# Patient Record
Sex: Female | Born: 1955 | Race: Black or African American | Hispanic: No | Marital: Single | Smoking: Former smoker
Health system: Southern US, Community
[De-identification: ages and names within clinical notes are randomized; demographics above are authoritative.]

## PROBLEM LIST (undated history)

## (undated) DIAGNOSIS — R569 Unspecified convulsions: Secondary | ICD-10-CM

## (undated) DIAGNOSIS — Z87828 Personal history of other (healed) physical injury and trauma: Secondary | ICD-10-CM

## (undated) DIAGNOSIS — F0390 Unspecified dementia without behavioral disturbance: Secondary | ICD-10-CM

## (undated) DIAGNOSIS — F32A Depression, unspecified: Secondary | ICD-10-CM

## (undated) DIAGNOSIS — Z21 Asymptomatic human immunodeficiency virus [HIV] infection status: Secondary | ICD-10-CM

## (undated) DIAGNOSIS — B20 Human immunodeficiency virus [HIV] disease: Secondary | ICD-10-CM

## (undated) DIAGNOSIS — F329 Major depressive disorder, single episode, unspecified: Secondary | ICD-10-CM

## (undated) DIAGNOSIS — F1910 Other psychoactive substance abuse, uncomplicated: Secondary | ICD-10-CM

## (undated) HISTORY — DX: Personal history of other (healed) physical injury and trauma: Z87.828

## (undated) HISTORY — PX: OTHER SURGICAL HISTORY: SHX169

## (undated) HISTORY — PX: BREAST BIOPSY: SHX20

## (undated) HISTORY — DX: Human immunodeficiency virus (HIV) disease: B20

## (undated) HISTORY — DX: Asymptomatic human immunodeficiency virus (hiv) infection status: Z21

## (undated) HISTORY — DX: Other psychoactive substance abuse, uncomplicated: F19.10

---

## 1997-04-29 ENCOUNTER — Encounter (INDEPENDENT_AMBULATORY_CARE_PROVIDER_SITE_OTHER): Payer: Self-pay | Admitting: *Deleted

## 1997-04-29 LAB — CONVERTED CEMR LAB
CD4 Count: 340 microliters
CD4 T Cell Abs: 340

## 1997-08-12 ENCOUNTER — Encounter: Admission: RE | Admit: 1997-08-12 | Discharge: 1997-08-12 | Payer: Self-pay | Admitting: Family Medicine

## 1997-08-16 ENCOUNTER — Encounter: Admission: RE | Admit: 1997-08-16 | Discharge: 1997-08-16 | Payer: Self-pay | Admitting: Family Medicine

## 1997-08-20 ENCOUNTER — Encounter: Admission: RE | Admit: 1997-08-20 | Discharge: 1997-08-20 | Payer: Self-pay | Admitting: Family Medicine

## 1997-08-28 ENCOUNTER — Encounter: Admission: RE | Admit: 1997-08-28 | Discharge: 1997-08-28 | Payer: Self-pay | Admitting: Family Medicine

## 1997-09-02 ENCOUNTER — Encounter: Admission: RE | Admit: 1997-09-02 | Discharge: 1997-09-02 | Payer: Self-pay | Admitting: Family Medicine

## 1997-09-02 ENCOUNTER — Other Ambulatory Visit: Admission: RE | Admit: 1997-09-02 | Discharge: 1997-09-02 | Payer: Self-pay

## 1997-09-05 ENCOUNTER — Encounter: Admission: RE | Admit: 1997-09-05 | Discharge: 1997-09-05 | Payer: Self-pay | Admitting: Sports Medicine

## 1997-09-16 ENCOUNTER — Encounter: Admission: RE | Admit: 1997-09-16 | Discharge: 1997-09-16 | Payer: Self-pay | Admitting: Family Medicine

## 1997-09-26 ENCOUNTER — Encounter: Admission: RE | Admit: 1997-09-26 | Discharge: 1997-09-26 | Payer: Self-pay | Admitting: Family Medicine

## 1997-09-27 ENCOUNTER — Encounter: Admission: RE | Admit: 1997-09-27 | Discharge: 1997-09-27 | Payer: Self-pay | Admitting: Infectious Diseases

## 1997-10-07 ENCOUNTER — Emergency Department (HOSPITAL_COMMUNITY): Admission: EM | Admit: 1997-10-07 | Discharge: 1997-10-07 | Payer: Self-pay | Admitting: Emergency Medicine

## 1997-10-21 ENCOUNTER — Encounter: Admission: RE | Admit: 1997-10-21 | Discharge: 1997-10-21 | Payer: Self-pay | Admitting: Infectious Diseases

## 1997-11-14 ENCOUNTER — Encounter: Admission: RE | Admit: 1997-11-14 | Discharge: 1997-11-14 | Payer: Self-pay | Admitting: Family Medicine

## 1997-11-14 ENCOUNTER — Encounter: Admission: RE | Admit: 1997-11-14 | Discharge: 1997-11-14 | Payer: Self-pay | Admitting: Infectious Diseases

## 1997-11-21 ENCOUNTER — Encounter: Admission: RE | Admit: 1997-11-21 | Discharge: 1997-11-21 | Payer: Self-pay | Admitting: Family Medicine

## 1997-11-22 ENCOUNTER — Encounter: Admission: RE | Admit: 1997-11-22 | Discharge: 1997-11-22 | Payer: Self-pay | Admitting: Family Medicine

## 1997-12-13 ENCOUNTER — Encounter: Admission: RE | Admit: 1997-12-13 | Discharge: 1997-12-13 | Payer: Self-pay | Admitting: Family Medicine

## 1997-12-25 ENCOUNTER — Encounter: Admission: RE | Admit: 1997-12-25 | Discharge: 1997-12-25 | Payer: Self-pay | Admitting: Family Medicine

## 1997-12-31 ENCOUNTER — Encounter: Admission: RE | Admit: 1997-12-31 | Discharge: 1997-12-31 | Payer: Self-pay | Admitting: Sports Medicine

## 1998-01-02 ENCOUNTER — Encounter: Admission: RE | Admit: 1998-01-02 | Discharge: 1998-01-02 | Payer: Self-pay | Admitting: Family Medicine

## 1998-01-29 ENCOUNTER — Encounter: Admission: RE | Admit: 1998-01-29 | Discharge: 1998-01-29 | Payer: Self-pay | Admitting: Family Medicine

## 1998-02-14 ENCOUNTER — Encounter: Admission: RE | Admit: 1998-02-14 | Discharge: 1998-02-14 | Payer: Self-pay | Admitting: Family Medicine

## 1998-02-27 ENCOUNTER — Encounter: Admission: RE | Admit: 1998-02-27 | Discharge: 1998-02-27 | Payer: Self-pay | Admitting: Family Medicine

## 1998-03-25 ENCOUNTER — Encounter: Admission: RE | Admit: 1998-03-25 | Discharge: 1998-03-25 | Payer: Self-pay | Admitting: Family Medicine

## 1998-03-28 ENCOUNTER — Encounter: Admission: RE | Admit: 1998-03-28 | Discharge: 1998-03-28 | Payer: Self-pay | Admitting: Family Medicine

## 1998-04-18 ENCOUNTER — Encounter: Admission: RE | Admit: 1998-04-18 | Discharge: 1998-04-18 | Payer: Self-pay | Admitting: Family Medicine

## 1998-05-20 ENCOUNTER — Encounter: Admission: RE | Admit: 1998-05-20 | Discharge: 1998-05-20 | Payer: Self-pay | Admitting: Family Medicine

## 1998-06-05 ENCOUNTER — Encounter: Admission: RE | Admit: 1998-06-05 | Discharge: 1998-06-05 | Payer: Self-pay | Admitting: Family Medicine

## 1998-07-10 ENCOUNTER — Encounter: Admission: RE | Admit: 1998-07-10 | Discharge: 1998-07-10 | Payer: Self-pay | Admitting: Internal Medicine

## 1998-07-10 ENCOUNTER — Ambulatory Visit (HOSPITAL_COMMUNITY): Admission: RE | Admit: 1998-07-10 | Discharge: 1998-07-10 | Payer: Self-pay | Admitting: Infectious Diseases

## 1998-07-16 ENCOUNTER — Encounter: Admission: RE | Admit: 1998-07-16 | Discharge: 1998-07-16 | Payer: Self-pay | Admitting: Family Medicine

## 1998-08-14 ENCOUNTER — Encounter: Admission: RE | Admit: 1998-08-14 | Discharge: 1998-08-14 | Payer: Self-pay | Admitting: Family Medicine

## 1998-08-15 ENCOUNTER — Encounter: Admission: RE | Admit: 1998-08-15 | Discharge: 1998-08-15 | Payer: Self-pay | Admitting: Infectious Diseases

## 1998-08-25 ENCOUNTER — Encounter: Admission: RE | Admit: 1998-08-25 | Discharge: 1998-08-25 | Payer: Self-pay | Admitting: Sports Medicine

## 1998-09-01 ENCOUNTER — Encounter: Admission: RE | Admit: 1998-09-01 | Discharge: 1998-09-01 | Payer: Self-pay | Admitting: Family Medicine

## 1998-09-11 ENCOUNTER — Encounter: Admission: RE | Admit: 1998-09-11 | Discharge: 1998-09-11 | Payer: Self-pay | Admitting: Family Medicine

## 1998-10-14 ENCOUNTER — Emergency Department (HOSPITAL_COMMUNITY): Admission: EM | Admit: 1998-10-14 | Discharge: 1998-10-14 | Payer: Self-pay | Admitting: Emergency Medicine

## 1998-10-14 ENCOUNTER — Encounter: Payer: Self-pay | Admitting: Emergency Medicine

## 1998-10-21 ENCOUNTER — Encounter: Admission: RE | Admit: 1998-10-21 | Discharge: 1998-10-21 | Payer: Self-pay | Admitting: Sports Medicine

## 1998-11-13 ENCOUNTER — Encounter: Admission: RE | Admit: 1998-11-13 | Discharge: 1998-11-13 | Payer: Self-pay | Admitting: Family Medicine

## 1998-11-13 ENCOUNTER — Ambulatory Visit (HOSPITAL_COMMUNITY): Admission: RE | Admit: 1998-11-13 | Discharge: 1998-11-13 | Payer: Self-pay | Admitting: Infectious Diseases

## 1998-12-09 ENCOUNTER — Encounter: Admission: RE | Admit: 1998-12-09 | Discharge: 1998-12-09 | Payer: Self-pay | Admitting: Family Medicine

## 1998-12-24 ENCOUNTER — Encounter: Admission: RE | Admit: 1998-12-24 | Discharge: 1998-12-24 | Payer: Self-pay | Admitting: Family Medicine

## 1998-12-30 ENCOUNTER — Encounter: Admission: RE | Admit: 1998-12-30 | Discharge: 1998-12-30 | Payer: Self-pay | Admitting: Family Medicine

## 1999-01-01 ENCOUNTER — Encounter: Admission: RE | Admit: 1999-01-01 | Discharge: 1999-01-01 | Payer: Self-pay | Admitting: Family Medicine

## 1999-01-21 ENCOUNTER — Encounter: Admission: RE | Admit: 1999-01-21 | Discharge: 1999-01-21 | Payer: Self-pay | Admitting: Family Medicine

## 1999-02-12 ENCOUNTER — Ambulatory Visit (HOSPITAL_COMMUNITY): Admission: RE | Admit: 1999-02-12 | Discharge: 1999-02-12 | Payer: Self-pay | Admitting: Infectious Diseases

## 1999-02-12 ENCOUNTER — Encounter: Admission: RE | Admit: 1999-02-12 | Discharge: 1999-02-12 | Payer: Self-pay | Admitting: Infectious Diseases

## 1999-03-30 ENCOUNTER — Encounter: Admission: RE | Admit: 1999-03-30 | Discharge: 1999-03-30 | Payer: Self-pay | Admitting: Family Medicine

## 1999-03-31 ENCOUNTER — Encounter: Admission: RE | Admit: 1999-03-31 | Discharge: 1999-03-31 | Payer: Self-pay | Admitting: Sports Medicine

## 1999-04-20 ENCOUNTER — Encounter: Admission: RE | Admit: 1999-04-20 | Discharge: 1999-04-20 | Payer: Self-pay | Admitting: Family Medicine

## 1999-04-29 ENCOUNTER — Encounter: Admission: RE | Admit: 1999-04-29 | Discharge: 1999-04-29 | Payer: Self-pay | Admitting: Family Medicine

## 1999-05-18 ENCOUNTER — Encounter: Admission: RE | Admit: 1999-05-18 | Discharge: 1999-05-18 | Payer: Self-pay | Admitting: Family Medicine

## 1999-06-03 ENCOUNTER — Encounter: Admission: RE | Admit: 1999-06-03 | Discharge: 1999-06-03 | Payer: Self-pay | Admitting: Family Medicine

## 1999-06-03 ENCOUNTER — Other Ambulatory Visit: Admission: RE | Admit: 1999-06-03 | Discharge: 1999-06-03 | Payer: Self-pay

## 1999-06-08 ENCOUNTER — Encounter: Admission: RE | Admit: 1999-06-08 | Discharge: 1999-06-08 | Payer: Self-pay | Admitting: Family Medicine

## 1999-06-25 ENCOUNTER — Encounter: Admission: RE | Admit: 1999-06-25 | Discharge: 1999-06-25 | Payer: Self-pay | Admitting: Family Medicine

## 1999-07-23 ENCOUNTER — Ambulatory Visit (HOSPITAL_COMMUNITY): Admission: RE | Admit: 1999-07-23 | Discharge: 1999-07-23 | Payer: Self-pay | Admitting: Infectious Diseases

## 1999-07-23 ENCOUNTER — Encounter: Admission: RE | Admit: 1999-07-23 | Discharge: 1999-07-23 | Payer: Self-pay | Admitting: Infectious Diseases

## 1999-08-10 ENCOUNTER — Encounter: Admission: RE | Admit: 1999-08-10 | Discharge: 1999-08-10 | Payer: Self-pay | Admitting: Sports Medicine

## 1999-08-10 ENCOUNTER — Encounter: Payer: Self-pay | Admitting: Sports Medicine

## 1999-08-31 ENCOUNTER — Encounter: Admission: RE | Admit: 1999-08-31 | Discharge: 1999-08-31 | Payer: Self-pay | Admitting: Family Medicine

## 1999-09-17 ENCOUNTER — Encounter: Admission: RE | Admit: 1999-09-17 | Discharge: 1999-09-17 | Payer: Self-pay | Admitting: Obstetrics

## 1999-11-05 ENCOUNTER — Encounter: Admission: RE | Admit: 1999-11-05 | Discharge: 1999-11-05 | Payer: Self-pay | Admitting: Family Medicine

## 2000-01-28 ENCOUNTER — Encounter: Admission: RE | Admit: 2000-01-28 | Discharge: 2000-01-28 | Payer: Self-pay | Admitting: Family Medicine

## 2000-02-22 ENCOUNTER — Encounter: Admission: RE | Admit: 2000-02-22 | Discharge: 2000-02-22 | Payer: Self-pay | Admitting: Family Medicine

## 2000-03-08 ENCOUNTER — Encounter: Admission: RE | Admit: 2000-03-08 | Discharge: 2000-03-08 | Payer: Self-pay | Admitting: Family Medicine

## 2000-03-15 ENCOUNTER — Encounter: Admission: RE | Admit: 2000-03-15 | Discharge: 2000-03-15 | Payer: Self-pay | Admitting: Infectious Diseases

## 2000-03-15 ENCOUNTER — Ambulatory Visit (HOSPITAL_COMMUNITY): Admission: RE | Admit: 2000-03-15 | Discharge: 2000-03-15 | Payer: Self-pay | Admitting: Infectious Diseases

## 2000-03-27 ENCOUNTER — Inpatient Hospital Stay (HOSPITAL_COMMUNITY): Admission: EM | Admit: 2000-03-27 | Discharge: 2000-03-30 | Payer: Self-pay | Admitting: *Deleted

## 2000-03-27 ENCOUNTER — Encounter: Payer: Self-pay | Admitting: *Deleted

## 2000-04-21 ENCOUNTER — Encounter: Admission: RE | Admit: 2000-04-21 | Discharge: 2000-04-21 | Payer: Self-pay | Admitting: Family Medicine

## 2000-06-07 ENCOUNTER — Encounter: Payer: Self-pay | Admitting: Specialist

## 2000-06-07 ENCOUNTER — Inpatient Hospital Stay (HOSPITAL_COMMUNITY): Admission: EM | Admit: 2000-06-07 | Discharge: 2000-06-13 | Payer: Self-pay | Admitting: Emergency Medicine

## 2000-06-09 ENCOUNTER — Encounter: Payer: Self-pay | Admitting: Specialist

## 2000-06-20 ENCOUNTER — Emergency Department (HOSPITAL_COMMUNITY): Admission: EM | Admit: 2000-06-20 | Discharge: 2000-06-20 | Payer: Self-pay | Admitting: Emergency Medicine

## 2000-07-04 ENCOUNTER — Encounter (HOSPITAL_COMMUNITY): Admission: RE | Admit: 2000-07-04 | Discharge: 2000-10-02 | Payer: Self-pay | Admitting: *Deleted

## 2000-07-14 ENCOUNTER — Encounter: Admission: RE | Admit: 2000-07-14 | Discharge: 2000-07-14 | Payer: Self-pay | Admitting: Family Medicine

## 2000-08-04 ENCOUNTER — Encounter: Admission: RE | Admit: 2000-08-04 | Discharge: 2000-08-04 | Payer: Self-pay | Admitting: Internal Medicine

## 2000-08-25 ENCOUNTER — Ambulatory Visit (HOSPITAL_COMMUNITY): Admission: RE | Admit: 2000-08-25 | Discharge: 2000-08-25 | Payer: Self-pay | Admitting: Infectious Diseases

## 2000-08-25 ENCOUNTER — Encounter: Admission: RE | Admit: 2000-08-25 | Discharge: 2000-08-25 | Payer: Self-pay | Admitting: Internal Medicine

## 2000-10-10 ENCOUNTER — Encounter: Admission: RE | Admit: 2000-10-10 | Discharge: 2000-10-10 | Payer: Self-pay | Admitting: Family Medicine

## 2000-12-01 ENCOUNTER — Ambulatory Visit (HOSPITAL_COMMUNITY): Admission: RE | Admit: 2000-12-01 | Discharge: 2000-12-01 | Payer: Self-pay | Admitting: Infectious Diseases

## 2000-12-01 ENCOUNTER — Encounter: Admission: RE | Admit: 2000-12-01 | Discharge: 2000-12-01 | Payer: Self-pay | Admitting: Infectious Diseases

## 2000-12-15 ENCOUNTER — Encounter: Admission: RE | Admit: 2000-12-15 | Discharge: 2000-12-15 | Payer: Self-pay | Admitting: Infectious Diseases

## 2000-12-22 ENCOUNTER — Encounter: Admission: RE | Admit: 2000-12-22 | Discharge: 2000-12-22 | Payer: Self-pay | Admitting: Infectious Diseases

## 2001-01-06 ENCOUNTER — Encounter: Admission: RE | Admit: 2001-01-06 | Discharge: 2001-01-06 | Payer: Self-pay | Admitting: Sports Medicine

## 2001-02-22 ENCOUNTER — Encounter (INDEPENDENT_AMBULATORY_CARE_PROVIDER_SITE_OTHER): Payer: Self-pay | Admitting: *Deleted

## 2001-02-22 ENCOUNTER — Other Ambulatory Visit: Admission: RE | Admit: 2001-02-22 | Discharge: 2001-02-22 | Payer: Self-pay | Admitting: *Deleted

## 2001-02-22 ENCOUNTER — Encounter: Admission: RE | Admit: 2001-02-22 | Discharge: 2001-02-22 | Payer: Self-pay | Admitting: Family Medicine

## 2001-03-15 ENCOUNTER — Encounter: Admission: RE | Admit: 2001-03-15 | Discharge: 2001-03-15 | Payer: Self-pay | Admitting: Family Medicine

## 2001-03-31 ENCOUNTER — Encounter: Admission: RE | Admit: 2001-03-31 | Discharge: 2001-03-31 | Payer: Self-pay | Admitting: Family Medicine

## 2001-05-25 ENCOUNTER — Encounter: Admission: RE | Admit: 2001-05-25 | Discharge: 2001-05-25 | Payer: Self-pay | Admitting: Family Medicine

## 2001-06-16 ENCOUNTER — Encounter: Admission: RE | Admit: 2001-06-16 | Discharge: 2001-06-16 | Payer: Self-pay | Admitting: Family Medicine

## 2001-11-13 ENCOUNTER — Ambulatory Visit (HOSPITAL_COMMUNITY): Admission: RE | Admit: 2001-11-13 | Discharge: 2001-11-13 | Payer: Self-pay | Admitting: Infectious Diseases

## 2001-11-13 ENCOUNTER — Encounter: Admission: RE | Admit: 2001-11-13 | Discharge: 2001-11-13 | Payer: Self-pay | Admitting: Infectious Diseases

## 2001-12-14 ENCOUNTER — Encounter: Admission: RE | Admit: 2001-12-14 | Discharge: 2001-12-14 | Payer: Self-pay | Admitting: Infectious Diseases

## 2002-01-19 ENCOUNTER — Other Ambulatory Visit: Admission: RE | Admit: 2002-01-19 | Discharge: 2002-01-19 | Payer: Self-pay | Admitting: Family Medicine

## 2002-01-19 ENCOUNTER — Encounter: Admission: RE | Admit: 2002-01-19 | Discharge: 2002-01-19 | Payer: Self-pay | Admitting: Family Medicine

## 2002-01-24 ENCOUNTER — Encounter: Admission: RE | Admit: 2002-01-24 | Discharge: 2002-01-24 | Payer: Self-pay | Admitting: Family Medicine

## 2002-01-24 ENCOUNTER — Encounter: Payer: Self-pay | Admitting: Family Medicine

## 2002-02-19 ENCOUNTER — Encounter: Admission: RE | Admit: 2002-02-19 | Discharge: 2002-02-19 | Payer: Self-pay | Admitting: Family Medicine

## 2002-02-26 ENCOUNTER — Encounter: Admission: RE | Admit: 2002-02-26 | Discharge: 2002-02-26 | Payer: Self-pay | Admitting: Family Medicine

## 2002-02-27 ENCOUNTER — Encounter (INDEPENDENT_AMBULATORY_CARE_PROVIDER_SITE_OTHER): Payer: Self-pay | Admitting: Specialist

## 2002-02-27 ENCOUNTER — Encounter: Payer: Self-pay | Admitting: Family Medicine

## 2002-02-27 ENCOUNTER — Encounter: Admission: RE | Admit: 2002-02-27 | Discharge: 2002-02-27 | Payer: Self-pay | Admitting: Family Medicine

## 2002-05-21 ENCOUNTER — Encounter: Admission: RE | Admit: 2002-05-21 | Discharge: 2002-05-21 | Payer: Self-pay | Admitting: Family Medicine

## 2002-08-16 ENCOUNTER — Encounter: Admission: RE | Admit: 2002-08-16 | Discharge: 2002-08-16 | Payer: Self-pay | Admitting: Infectious Diseases

## 2002-08-20 ENCOUNTER — Encounter: Admission: RE | Admit: 2002-08-20 | Discharge: 2002-08-20 | Payer: Self-pay | Admitting: Sports Medicine

## 2002-08-30 ENCOUNTER — Encounter: Admission: RE | Admit: 2002-08-30 | Discharge: 2002-08-30 | Payer: Self-pay | Admitting: Infectious Diseases

## 2002-11-07 ENCOUNTER — Encounter: Admission: RE | Admit: 2002-11-07 | Discharge: 2002-11-07 | Payer: Self-pay | Admitting: Family Medicine

## 2003-01-09 ENCOUNTER — Encounter (INDEPENDENT_AMBULATORY_CARE_PROVIDER_SITE_OTHER): Payer: Self-pay | Admitting: *Deleted

## 2003-01-09 LAB — CONVERTED CEMR LAB

## 2003-01-24 ENCOUNTER — Encounter: Admission: RE | Admit: 2003-01-24 | Discharge: 2003-01-24 | Payer: Self-pay | Admitting: Family Medicine

## 2003-02-04 ENCOUNTER — Other Ambulatory Visit: Admission: RE | Admit: 2003-02-04 | Discharge: 2003-02-04 | Payer: Self-pay | Admitting: Family Medicine

## 2003-02-04 ENCOUNTER — Encounter: Admission: RE | Admit: 2003-02-04 | Discharge: 2003-02-04 | Payer: Self-pay | Admitting: Family Medicine

## 2003-03-28 ENCOUNTER — Encounter: Admission: RE | Admit: 2003-03-28 | Discharge: 2003-03-28 | Payer: Self-pay | Admitting: Infectious Diseases

## 2003-04-23 ENCOUNTER — Encounter: Admission: RE | Admit: 2003-04-23 | Discharge: 2003-04-23 | Payer: Self-pay | Admitting: Sports Medicine

## 2003-05-17 ENCOUNTER — Encounter: Admission: RE | Admit: 2003-05-17 | Discharge: 2003-05-17 | Payer: Self-pay | Admitting: Infectious Diseases

## 2003-07-11 ENCOUNTER — Encounter: Admission: RE | Admit: 2003-07-11 | Discharge: 2003-07-11 | Payer: Self-pay | Admitting: Family Medicine

## 2003-08-22 ENCOUNTER — Emergency Department (HOSPITAL_COMMUNITY): Admission: EM | Admit: 2003-08-22 | Discharge: 2003-08-22 | Payer: Self-pay | Admitting: Emergency Medicine

## 2003-10-11 ENCOUNTER — Emergency Department (HOSPITAL_COMMUNITY): Admission: EM | Admit: 2003-10-11 | Discharge: 2003-10-11 | Payer: Self-pay | Admitting: Emergency Medicine

## 2003-10-24 ENCOUNTER — Emergency Department (HOSPITAL_COMMUNITY): Admission: EM | Admit: 2003-10-24 | Discharge: 2003-10-24 | Payer: Self-pay | Admitting: Emergency Medicine

## 2003-11-08 ENCOUNTER — Ambulatory Visit (HOSPITAL_COMMUNITY): Admission: RE | Admit: 2003-11-08 | Discharge: 2003-11-08 | Payer: Self-pay | Admitting: Infectious Diseases

## 2003-11-08 ENCOUNTER — Encounter: Admission: RE | Admit: 2003-11-08 | Discharge: 2003-11-08 | Payer: Self-pay | Admitting: Infectious Diseases

## 2004-02-03 ENCOUNTER — Ambulatory Visit: Payer: Self-pay | Admitting: Sports Medicine

## 2004-02-17 ENCOUNTER — Ambulatory Visit: Payer: Self-pay | Admitting: Sports Medicine

## 2004-04-10 ENCOUNTER — Ambulatory Visit: Payer: Self-pay | Admitting: Infectious Diseases

## 2004-06-18 ENCOUNTER — Ambulatory Visit (HOSPITAL_COMMUNITY): Admission: RE | Admit: 2004-06-18 | Discharge: 2004-06-18 | Payer: Self-pay | Admitting: Infectious Diseases

## 2004-06-18 ENCOUNTER — Ambulatory Visit: Payer: Self-pay | Admitting: Infectious Diseases

## 2004-11-02 ENCOUNTER — Ambulatory Visit: Payer: Self-pay | Admitting: Sports Medicine

## 2004-11-23 ENCOUNTER — Ambulatory Visit: Payer: Self-pay | Admitting: Sports Medicine

## 2005-01-07 ENCOUNTER — Ambulatory Visit: Payer: Self-pay | Admitting: Infectious Diseases

## 2005-01-19 ENCOUNTER — Emergency Department (HOSPITAL_COMMUNITY): Admission: EM | Admit: 2005-01-19 | Discharge: 2005-01-19 | Payer: Self-pay | Admitting: Emergency Medicine

## 2005-03-30 ENCOUNTER — Ambulatory Visit: Payer: Self-pay | Admitting: Obstetrics & Gynecology

## 2005-03-30 ENCOUNTER — Encounter (INDEPENDENT_AMBULATORY_CARE_PROVIDER_SITE_OTHER): Payer: Self-pay | Admitting: *Deleted

## 2005-03-30 ENCOUNTER — Encounter: Payer: Self-pay | Admitting: Obstetrics & Gynecology

## 2005-04-07 ENCOUNTER — Ambulatory Visit: Payer: Self-pay | Admitting: Infectious Diseases

## 2005-04-07 ENCOUNTER — Ambulatory Visit (HOSPITAL_COMMUNITY): Admission: RE | Admit: 2005-04-07 | Discharge: 2005-04-07 | Payer: Self-pay | Admitting: Infectious Diseases

## 2005-05-24 ENCOUNTER — Emergency Department (HOSPITAL_COMMUNITY): Admission: EM | Admit: 2005-05-24 | Discharge: 2005-05-25 | Payer: Self-pay | Admitting: Emergency Medicine

## 2005-06-11 ENCOUNTER — Ambulatory Visit: Payer: Self-pay | Admitting: Family Medicine

## 2005-07-15 ENCOUNTER — Ambulatory Visit: Payer: Self-pay | Admitting: Family Medicine

## 2005-07-29 ENCOUNTER — Ambulatory Visit: Payer: Self-pay | Admitting: Family Medicine

## 2005-08-18 ENCOUNTER — Encounter: Admission: RE | Admit: 2005-08-18 | Discharge: 2005-08-18 | Payer: Self-pay | Admitting: Infectious Diseases

## 2005-08-18 ENCOUNTER — Ambulatory Visit: Payer: Self-pay | Admitting: Infectious Diseases

## 2005-08-18 ENCOUNTER — Encounter (INDEPENDENT_AMBULATORY_CARE_PROVIDER_SITE_OTHER): Payer: Self-pay | Admitting: *Deleted

## 2005-08-18 LAB — CONVERTED CEMR LAB
CD4 Count: 750 microliters
HIV 1 RNA Quant: 399 copies/mL

## 2005-10-27 ENCOUNTER — Ambulatory Visit: Payer: Self-pay | Admitting: Family Medicine

## 2006-01-27 ENCOUNTER — Encounter: Admission: RE | Admit: 2006-01-27 | Discharge: 2006-01-27 | Payer: Self-pay | Admitting: Infectious Diseases

## 2006-01-27 ENCOUNTER — Encounter (INDEPENDENT_AMBULATORY_CARE_PROVIDER_SITE_OTHER): Payer: Self-pay | Admitting: *Deleted

## 2006-01-27 ENCOUNTER — Ambulatory Visit: Payer: Self-pay | Admitting: Infectious Diseases

## 2006-03-11 DIAGNOSIS — F1420 Cocaine dependence, uncomplicated: Secondary | ICD-10-CM | POA: Insufficient documentation

## 2006-03-11 DIAGNOSIS — F1020 Alcohol dependence, uncomplicated: Secondary | ICD-10-CM | POA: Insufficient documentation

## 2006-03-11 DIAGNOSIS — Z8619 Personal history of other infectious and parasitic diseases: Secondary | ICD-10-CM

## 2006-03-11 DIAGNOSIS — B20 Human immunodeficiency virus [HIV] disease: Secondary | ICD-10-CM | POA: Insufficient documentation

## 2006-03-11 DIAGNOSIS — F0390 Unspecified dementia without behavioral disturbance: Secondary | ICD-10-CM

## 2006-03-11 DIAGNOSIS — F329 Major depressive disorder, single episode, unspecified: Secondary | ICD-10-CM

## 2006-03-11 DIAGNOSIS — R569 Unspecified convulsions: Secondary | ICD-10-CM | POA: Insufficient documentation

## 2006-03-11 DIAGNOSIS — A5149 Other secondary syphilitic conditions: Secondary | ICD-10-CM | POA: Insufficient documentation

## 2006-06-09 ENCOUNTER — Encounter (INDEPENDENT_AMBULATORY_CARE_PROVIDER_SITE_OTHER): Payer: Self-pay | Admitting: *Deleted

## 2006-06-09 ENCOUNTER — Encounter: Admission: RE | Admit: 2006-06-09 | Discharge: 2006-06-09 | Payer: Self-pay | Admitting: Infectious Diseases

## 2006-06-09 ENCOUNTER — Ambulatory Visit: Payer: Self-pay | Admitting: Infectious Diseases

## 2006-06-09 LAB — CONVERTED CEMR LAB
BUN: 10 mg/dL (ref 6–23)
Basophils Relative: 1 % (ref 0–1)
CO2: 26 meq/L (ref 19–32)
Chloride: 109 meq/L (ref 96–112)
Eosinophils Absolute: 0.1 10*3/uL (ref 0.0–0.7)
HIV 1 RNA Quant: <50 copies/mL (ref <50)
HIV-1 RNA Quant, Log: <1.7 (ref <1.70)
Lymphocytes Relative: 39 % (ref 12–46)
Lymphs Abs: 2 10*3/uL (ref 0.7–3.3)
Monocytes Relative: 7 % (ref 3–11)
Neutro Abs: 2.6 10*3/uL (ref 1.7–7.7)
Neutrophils Relative %: 50 % (ref 43–77)
Platelets: 174 10*3/uL (ref 150–400)
RDW: 14.7 % — ABNORMAL HIGH (ref 11.5–14.0)
Total Bilirubin: 0.3 mg/dL (ref 0.3–1.2)
Total Protein: 8.6 g/dL — ABNORMAL HIGH (ref 6.0–8.3)

## 2006-07-04 ENCOUNTER — Encounter: Payer: Self-pay | Admitting: Infectious Diseases

## 2006-07-04 ENCOUNTER — Encounter (INDEPENDENT_AMBULATORY_CARE_PROVIDER_SITE_OTHER): Payer: Self-pay | Admitting: *Deleted

## 2006-07-04 LAB — CONVERTED CEMR LAB: Pap Smear: NORMAL

## 2006-07-08 ENCOUNTER — Encounter (INDEPENDENT_AMBULATORY_CARE_PROVIDER_SITE_OTHER): Payer: Self-pay | Admitting: *Deleted

## 2006-07-17 ENCOUNTER — Encounter (INDEPENDENT_AMBULATORY_CARE_PROVIDER_SITE_OTHER): Payer: Self-pay | Admitting: *Deleted

## 2006-09-08 ENCOUNTER — Telehealth: Payer: Self-pay | Admitting: *Deleted

## 2006-09-09 ENCOUNTER — Encounter: Admission: RE | Admit: 2006-09-09 | Discharge: 2006-09-09 | Payer: Self-pay | Admitting: Internal Medicine

## 2006-09-09 ENCOUNTER — Ambulatory Visit: Payer: Self-pay | Admitting: Internal Medicine

## 2006-09-09 LAB — CONVERTED CEMR LAB
ALT: 28 units/L (ref 0–35)
AST: 43 units/L — ABNORMAL HIGH (ref 0–37)
Albumin: 4.3 g/dL (ref 3.5–5.2)
Basophils Absolute: 0 10*3/uL (ref 0.0–0.1)
Basophils Relative: 1 % (ref 0–1)
Calcium: 8.8 mg/dL (ref 8.4–10.5)
Eosinophils Relative: 2 % (ref 0–5)
HCT: 40.6 % (ref 36.0–46.0)
MCHC: 31.5 g/dL (ref 30.0–36.0)
MCV: 89.2 fL (ref 78.0–100.0)
Monocytes Relative: 8 % (ref 3–11)
Neutrophils Relative %: 54 % (ref 43–77)
WBC: 4.6 10*3/uL (ref 4.0–10.5)

## 2006-09-16 ENCOUNTER — Telehealth: Payer: Self-pay | Admitting: Infectious Diseases

## 2006-09-21 ENCOUNTER — Ambulatory Visit: Payer: Self-pay | Admitting: Family Medicine

## 2006-09-21 ENCOUNTER — Encounter: Payer: Self-pay | Admitting: Family Medicine

## 2006-09-21 DIAGNOSIS — N912 Amenorrhea, unspecified: Secondary | ICD-10-CM

## 2006-10-13 ENCOUNTER — Ambulatory Visit: Payer: Self-pay | Admitting: Family Medicine

## 2007-01-30 ENCOUNTER — Ambulatory Visit: Payer: Self-pay | Admitting: Sports Medicine

## 2007-01-30 ENCOUNTER — Encounter (INDEPENDENT_AMBULATORY_CARE_PROVIDER_SITE_OTHER): Payer: Self-pay | Admitting: *Deleted

## 2007-01-30 LAB — CONVERTED CEMR LAB: Beta hcg, urine, semiquantitative: NEGATIVE

## 2007-03-03 ENCOUNTER — Telehealth: Payer: Self-pay | Admitting: Infectious Diseases

## 2007-03-27 ENCOUNTER — Telehealth: Payer: Self-pay | Admitting: Infectious Diseases

## 2007-04-25 ENCOUNTER — Encounter (INDEPENDENT_AMBULATORY_CARE_PROVIDER_SITE_OTHER): Payer: Self-pay | Admitting: *Deleted

## 2007-04-25 ENCOUNTER — Ambulatory Visit: Payer: Self-pay | Admitting: Family Medicine

## 2007-04-25 DIAGNOSIS — R259 Unspecified abnormal involuntary movements: Secondary | ICD-10-CM | POA: Insufficient documentation

## 2007-04-25 LAB — CONVERTED CEMR LAB
Amphetamine Screen, Ur: NEGATIVE
Barbiturate Quant, Ur: NEGATIVE
Benzodiazepines.: NEGATIVE
Cocaine Metabolites: NEGATIVE
Methadone: NEGATIVE
Phencyclidine (PCP): NEGATIVE

## 2007-05-09 ENCOUNTER — Encounter: Payer: Self-pay | Admitting: Infectious Diseases

## 2007-06-10 ENCOUNTER — Emergency Department (HOSPITAL_COMMUNITY): Admission: EM | Admit: 2007-06-10 | Discharge: 2007-06-10 | Payer: Self-pay | Admitting: Emergency Medicine

## 2007-06-22 ENCOUNTER — Encounter: Payer: Self-pay | Admitting: *Deleted

## 2007-08-07 ENCOUNTER — Encounter (INDEPENDENT_AMBULATORY_CARE_PROVIDER_SITE_OTHER): Payer: Self-pay | Admitting: *Deleted

## 2007-08-07 ENCOUNTER — Encounter: Payer: Self-pay | Admitting: Infectious Diseases

## 2007-08-10 ENCOUNTER — Encounter: Admission: RE | Admit: 2007-08-10 | Discharge: 2007-08-10 | Payer: Self-pay | Admitting: Infectious Diseases

## 2007-08-10 ENCOUNTER — Ambulatory Visit: Payer: Self-pay | Admitting: Infectious Diseases

## 2007-08-10 LAB — CONVERTED CEMR LAB
ALT: 33 units/L (ref 0–35)
Basophils Absolute: 0 10*3/uL (ref 0.0–0.1)
CO2: 22 meq/L (ref 19–32)
Calcium: 9.2 mg/dL (ref 8.4–10.5)
HDL: 38 mg/dL — ABNORMAL LOW (ref >39)
MCHC: 31.8 g/dL (ref 30.0–36.0)
MCV: 85.5 fL (ref 78.0–100.0)
Monocytes Relative: 9 % (ref 3–12)
Neutrophils Relative %: 52 % (ref 43–77)
Platelets: 154 10*3/uL (ref 150–400)
RBC: 4.68 M/uL (ref 3.87–5.11)
RPR Titer: 1:2 {titer}
Sodium: 141 meq/L (ref 135–145)
Total CHOL/HDL Ratio: 4.3
Triglycerides: 194 mg/dL — ABNORMAL HIGH (ref <150)
WBC: 4.4 10*3/uL (ref 4.0–10.5)

## 2007-08-24 ENCOUNTER — Ambulatory Visit: Payer: Self-pay | Admitting: Infectious Diseases

## 2007-09-04 ENCOUNTER — Telehealth (INDEPENDENT_AMBULATORY_CARE_PROVIDER_SITE_OTHER): Payer: Self-pay | Admitting: *Deleted

## 2007-09-26 ENCOUNTER — Encounter: Payer: Self-pay | Admitting: Infectious Diseases

## 2007-09-26 ENCOUNTER — Ambulatory Visit: Payer: Self-pay | Admitting: Infectious Diseases

## 2007-12-04 ENCOUNTER — Ambulatory Visit: Payer: Self-pay | Admitting: Family Medicine

## 2007-12-04 ENCOUNTER — Encounter: Admission: RE | Admit: 2007-12-04 | Discharge: 2007-12-04 | Payer: Self-pay | Admitting: Infectious Diseases

## 2007-12-04 ENCOUNTER — Telehealth (INDEPENDENT_AMBULATORY_CARE_PROVIDER_SITE_OTHER): Payer: Self-pay | Admitting: *Deleted

## 2007-12-04 ENCOUNTER — Telehealth: Payer: Self-pay | Admitting: *Deleted

## 2007-12-04 ENCOUNTER — Ambulatory Visit: Payer: Self-pay | Admitting: Infectious Diseases

## 2007-12-04 LAB — CONVERTED CEMR LAB
AST: 42 units/L — ABNORMAL HIGH (ref 0–37)
BUN: 9 mg/dL (ref 6–23)
CO2: 25 meq/L (ref 19–32)
Calcium: 8.8 mg/dL (ref 8.4–10.5)
Creatinine, Ser: 0.9 mg/dL (ref 0.40–1.20)
Glucose, Bld: 95 mg/dL (ref 70–99)
HCT: 37.8 % (ref 36.0–46.0)
Lymphs Abs: 2.4 10*3/uL (ref 0.7–4.0)
MCHC: 31.7 g/dL (ref 30.0–36.0)
MCV: 85.5 fL (ref 78.0–100.0)
Neutrophils Relative %: 47 % (ref 43–77)
Platelets: 180 10*3/uL (ref 150–400)
Potassium: 4.5 meq/L (ref 3.5–5.3)
RBC: 4.42 M/uL (ref 3.87–5.11)
Sodium: 140 meq/L (ref 135–145)
Total Protein: 8 g/dL (ref 6.0–8.3)
WBC: 5.5 10*3/uL (ref 4.0–10.5)

## 2008-01-09 ENCOUNTER — Telehealth: Payer: Self-pay | Admitting: *Deleted

## 2008-03-01 ENCOUNTER — Ambulatory Visit: Payer: Self-pay | Admitting: Internal Medicine

## 2008-03-08 ENCOUNTER — Telehealth: Payer: Self-pay | Admitting: *Deleted

## 2008-06-20 ENCOUNTER — Ambulatory Visit: Payer: Self-pay | Admitting: Infectious Diseases

## 2008-06-20 LAB — CONVERTED CEMR LAB
HIV 1 RNA Quant: 73 copies/mL — ABNORMAL HIGH (ref <48)
HIV-1 RNA Quant, Log: 1.86 — ABNORMAL HIGH (ref <1.68)

## 2008-06-21 ENCOUNTER — Telehealth (INDEPENDENT_AMBULATORY_CARE_PROVIDER_SITE_OTHER): Payer: Self-pay | Admitting: Licensed Clinical Social Worker

## 2008-06-25 ENCOUNTER — Ambulatory Visit: Payer: Self-pay | Admitting: Internal Medicine

## 2008-06-26 LAB — CONVERTED CEMR LAB
Albumin: 4.2 g/dL (ref 3.5–5.2)
BUN: 12 mg/dL (ref 6–23)
CO2: 25 meq/L (ref 19–32)
Calcium: 9.2 mg/dL (ref 8.4–10.5)
Chloride: 106 meq/L (ref 96–112)
Cholesterol: 209 mg/dL — ABNORMAL HIGH (ref 0–200)
Eosinophils Absolute: 0.2 10*3/uL (ref 0.0–0.7)
Glucose, Bld: 106 mg/dL — ABNORMAL HIGH (ref 70–99)
HDL: 62 mg/dL (ref >39)
LDL Cholesterol: 116 mg/dL — ABNORMAL HIGH (ref 0–99)
Lymphocytes Relative: 31 % (ref 12–46)
Lymphs Abs: 1.6 10*3/uL (ref 0.7–4.0)
MCV: 85.4 fL (ref 78.0–100.0)
Monocytes Relative: 8 % (ref 3–12)
Neutrophils Relative %: 57 % (ref 43–77)
Platelets: 165 10*3/uL (ref 150–400)
Potassium: 4.4 meq/L (ref 3.5–5.3)
RBC: 4.53 M/uL (ref 3.87–5.11)
Sodium: 141 meq/L (ref 135–145)
Total Protein: 8.6 g/dL — ABNORMAL HIGH (ref 6.0–8.3)
Triglycerides: 155 mg/dL — ABNORMAL HIGH (ref <150)
WBC: 5.2 10*3/uL (ref 4.0–10.5)

## 2008-07-02 ENCOUNTER — Ambulatory Visit: Payer: Self-pay | Admitting: Internal Medicine

## 2008-07-09 ENCOUNTER — Ambulatory Visit: Payer: Self-pay | Admitting: Internal Medicine

## 2008-07-10 ENCOUNTER — Ambulatory Visit: Payer: Self-pay | Admitting: Internal Medicine

## 2008-08-08 ENCOUNTER — Ambulatory Visit: Payer: Self-pay | Admitting: Family Medicine

## 2008-08-08 LAB — CONVERTED CEMR LAB: Beta hcg, urine, semiquantitative: NEGATIVE

## 2008-10-08 ENCOUNTER — Ambulatory Visit: Payer: Self-pay | Admitting: Internal Medicine

## 2008-10-08 LAB — CONVERTED CEMR LAB
ALT: 45 units/L — ABNORMAL HIGH (ref 0–35)
AST: 55 units/L — ABNORMAL HIGH (ref 0–37)
Albumin: 4.5 g/dL (ref 3.5–5.2)
Alkaline Phosphatase: 62 units/L (ref 39–117)
BUN: 9 mg/dL (ref 6–23)
Basophils Relative: 1 % (ref 0–1)
Calcium: 9 mg/dL (ref 8.4–10.5)
Chloride: 107 meq/L (ref 96–112)
Eosinophils Absolute: 0.1 10*3/uL (ref 0.0–0.7)
Eosinophils Relative: 2 % (ref 0–5)
HCT: 39.4 % (ref 36.0–46.0)
HIV 1 RNA Quant: <48 copies/mL (ref <48)
HIV-1 RNA Quant, Log: <1.68 (ref <1.68)
Lymphs Abs: 2.2 10*3/uL (ref 0.7–4.0)
MCHC: 32.2 g/dL (ref 30.0–36.0)
MCV: 83.7 fL (ref 78.0–100.0)
Monocytes Relative: 8 % (ref 3–12)
Platelets: 171 10*3/uL (ref 150–400)
Potassium: 4.1 meq/L (ref 3.5–5.3)
RBC: 4.71 M/uL (ref 3.87–5.11)
Sodium: 142 meq/L (ref 135–145)
WBC: 5.1 10*3/uL (ref 4.0–10.5)

## 2008-10-22 ENCOUNTER — Ambulatory Visit: Payer: Self-pay | Admitting: Internal Medicine

## 2008-10-25 ENCOUNTER — Encounter: Payer: Self-pay | Admitting: Internal Medicine

## 2008-10-25 ENCOUNTER — Ambulatory Visit: Payer: Self-pay | Admitting: Internal Medicine

## 2008-10-25 ENCOUNTER — Encounter: Payer: Self-pay | Admitting: Licensed Clinical Social Worker

## 2008-11-08 ENCOUNTER — Telehealth: Payer: Self-pay | Admitting: Licensed Clinical Social Worker

## 2008-11-25 ENCOUNTER — Encounter: Payer: Self-pay | Admitting: Internal Medicine

## 2008-11-25 ENCOUNTER — Ambulatory Visit: Payer: Self-pay | Admitting: Internal Medicine

## 2008-12-03 ENCOUNTER — Encounter: Payer: Self-pay | Admitting: Internal Medicine

## 2008-12-26 ENCOUNTER — Emergency Department (HOSPITAL_COMMUNITY): Admission: EM | Admit: 2008-12-26 | Discharge: 2008-12-26 | Payer: Self-pay | Admitting: Emergency Medicine

## 2008-12-27 ENCOUNTER — Encounter (INDEPENDENT_AMBULATORY_CARE_PROVIDER_SITE_OTHER): Payer: Self-pay | Admitting: *Deleted

## 2009-01-02 ENCOUNTER — Encounter: Admission: RE | Admit: 2009-01-02 | Discharge: 2009-01-02 | Payer: Self-pay | Admitting: Internal Medicine

## 2009-02-04 ENCOUNTER — Ambulatory Visit: Payer: Self-pay | Admitting: Internal Medicine

## 2009-02-04 LAB — CONVERTED CEMR LAB
ALT: 27 units/L (ref 0–35)
AST: 42 units/L — ABNORMAL HIGH (ref 0–37)
Albumin: 4.1 g/dL (ref 3.5–5.2)
Basophils Absolute: 0 10*3/uL (ref 0.0–0.1)
CO2: 21 meq/L (ref 19–32)
Calcium: 8.6 mg/dL (ref 8.4–10.5)
Chloride: 106 meq/L (ref 96–112)
Eosinophils Relative: 3 % (ref 0–5)
HIV 1 RNA Quant: <48 copies/mL (ref <48)
HIV-1 RNA Quant, Log: <1.68 (ref <1.68)
Lymphocytes Relative: 39 % (ref 12–46)
Neutro Abs: 2.3 10*3/uL (ref 1.7–7.7)
Neutrophils Relative %: 49 % (ref 43–77)
Platelets: 176 10*3/uL (ref 150–400)
Potassium: 3.6 meq/L (ref 3.5–5.3)
RDW: 13.7 % (ref 11.5–15.5)
Total Protein: 7.6 g/dL (ref 6.0–8.3)

## 2009-07-17 ENCOUNTER — Ambulatory Visit: Payer: Self-pay | Admitting: Infectious Diseases

## 2009-07-17 LAB — CONVERTED CEMR LAB
AST: 40 units/L — ABNORMAL HIGH (ref 0–37)
Alkaline Phosphatase: 64 units/L (ref 39–117)
BUN: 8 mg/dL (ref 6–23)
Basophils Absolute: 0 10*3/uL (ref 0.0–0.1)
Creatinine, Ser: 0.92 mg/dL (ref 0.40–1.20)
Eosinophils Relative: 4 % (ref 0–5)
HCT: 39.5 % (ref 36.0–46.0)
Lymphocytes Relative: 42 % (ref 12–46)
Platelets: 181 10*3/uL (ref 150–400)
Potassium: 3.9 meq/L (ref 3.5–5.3)
RDW: 14.1 % (ref 11.5–15.5)

## 2009-08-21 ENCOUNTER — Ambulatory Visit: Payer: Self-pay | Admitting: Internal Medicine

## 2009-10-01 ENCOUNTER — Encounter: Payer: Self-pay | Admitting: Internal Medicine

## 2009-10-16 ENCOUNTER — Encounter: Admission: RE | Admit: 2009-10-16 | Discharge: 2009-10-16 | Payer: Self-pay | Admitting: Neurology

## 2009-12-23 ENCOUNTER — Ambulatory Visit: Payer: Self-pay | Admitting: Internal Medicine

## 2009-12-23 LAB — CONVERTED CEMR LAB
Albumin: 4 g/dL (ref 3.5–5.2)
Alkaline Phosphatase: 56 units/L (ref 39–117)
BUN: 9 mg/dL (ref 6–23)
Cholesterol: 203 mg/dL — ABNORMAL HIGH (ref 0–200)
Eosinophils Absolute: 0.2 10*3/uL (ref 0.0–0.7)
Eosinophils Relative: 4 % (ref 0–5)
Glucose, Bld: 85 mg/dL (ref 70–99)
HCT: 37.4 % (ref 36.0–46.0)
HDL: 42 mg/dL (ref >39)
Hemoglobin: 11.9 g/dL — ABNORMAL LOW (ref 12.0–15.0)
LDL Cholesterol: 121 mg/dL — ABNORMAL HIGH (ref 0–99)
Lymphs Abs: 1.4 10*3/uL (ref 0.7–4.0)
MCV: 86.6 fL (ref 78.0–100.0)
Monocytes Relative: 9 % (ref 3–12)
RBC: 4.32 M/uL (ref 3.87–5.11)
Total Bilirubin: 0.3 mg/dL (ref 0.3–1.2)
Triglycerides: 201 mg/dL — ABNORMAL HIGH (ref <150)
VLDL: 40 mg/dL (ref 0–40)
WBC: 4.1 10*3/uL (ref 4.0–10.5)

## 2010-01-06 ENCOUNTER — Ambulatory Visit: Payer: Self-pay | Admitting: Internal Medicine

## 2010-01-21 ENCOUNTER — Telehealth (INDEPENDENT_AMBULATORY_CARE_PROVIDER_SITE_OTHER): Payer: Self-pay | Admitting: *Deleted

## 2010-01-22 ENCOUNTER — Ambulatory Visit: Payer: Self-pay | Admitting: Internal Medicine

## 2010-04-08 ENCOUNTER — Ambulatory Visit: Payer: Self-pay | Admitting: Internal Medicine

## 2010-04-08 DIAGNOSIS — N898 Other specified noninflammatory disorders of vagina: Secondary | ICD-10-CM | POA: Insufficient documentation

## 2010-04-08 LAB — CONVERTED CEMR LAB
HIV 1 RNA Quant: <20 copies/mL (ref <20)
HIV-1 RNA Quant, Log: <1.3 (ref <1.30)

## 2010-04-09 ENCOUNTER — Encounter: Payer: Self-pay | Admitting: Internal Medicine

## 2010-04-09 LAB — CONVERTED CEMR LAB
Bilirubin Urine: NEGATIVE
Casts: NONE SEEN /lpf
Gardnerella vaginalis: NEGATIVE
Protein, ur: NEGATIVE mg/dL
Urine Glucose: NEGATIVE mg/dL
pH: 6.5 (ref 5.0–8.0)

## 2010-04-10 LAB — CONVERTED CEMR LAB
ALT: 33 units/L (ref 0–35)
BUN: 14 mg/dL (ref 6–23)
CO2: 29 meq/L (ref 19–32)
Calcium: 9.1 mg/dL (ref 8.4–10.5)
Chloride: 103 meq/L (ref 96–112)
Creatinine, Ser: 0.87 mg/dL (ref 0.40–1.20)
Eosinophils Absolute: 0.1 10*3/uL (ref 0.0–0.7)
Eosinophils Relative: 2 % (ref 0–5)
Glucose, Bld: 90 mg/dL (ref 70–99)
HCT: 37.6 % (ref 36.0–46.0)
Hemoglobin: 12 g/dL (ref 12.0–15.0)
Lymphocytes Relative: 39 % (ref 12–46)
Lymphs Abs: 2.1 10*3/uL (ref 0.7–4.0)
MCV: 84.9 fL (ref 78.0–100.0)
Monocytes Relative: 8 % (ref 3–12)
RBC: 4.43 M/uL (ref 3.87–5.11)
Total Bilirubin: 0.3 mg/dL (ref 0.3–1.2)
WBC: 5.3 10*3/uL (ref 4.0–10.5)

## 2010-04-13 ENCOUNTER — Ambulatory Visit: Payer: Self-pay | Admitting: Internal Medicine

## 2010-04-13 LAB — CONVERTED CEMR LAB: Pap Smear: NEGATIVE

## 2010-04-20 ENCOUNTER — Ambulatory Visit: Payer: Self-pay | Admitting: Internal Medicine

## 2010-04-27 ENCOUNTER — Ambulatory Visit: Payer: Self-pay | Admitting: Internal Medicine

## 2010-05-18 ENCOUNTER — Ambulatory Visit
Admission: RE | Admit: 2010-05-18 | Discharge: 2010-05-18 | Payer: Self-pay | Source: Home / Self Care | Attending: Internal Medicine | Admitting: Internal Medicine

## 2010-05-30 ENCOUNTER — Encounter: Payer: Self-pay | Admitting: Obstetrics & Gynecology

## 2010-05-31 ENCOUNTER — Encounter: Payer: Self-pay | Admitting: Infectious Diseases

## 2010-06-01 ENCOUNTER — Encounter: Payer: Self-pay | Admitting: Internal Medicine
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
## 2010-06-11 NOTE — Progress Notes (Signed)
Summary: Hep A  Phone Note Outgoing Call   Call placed by: Annice Pih Summary of Call: Called pt. and notified she needed to come in for Hep A series and flu vaccine.  Given nurse appt. for 01/22/10 at 2:00 pm Initial call taken by: Wendall Mola CMA [facility]),  January 21, 2010 1:46 PM

## 2010-06-11 NOTE — Consult Note (Signed)
Summary: [facility]  [facility]   Imported By: Florinda Marker 10/08/2009 09:17:34  _____________________________________________________________________  External Attachment:    Type:   Image     Comment:   External Document

## 2010-06-11 NOTE — Assessment & Plan Note (Signed)
Summary: PAP smear & Bicillian Inj. #1  CC: PAP smear and Bicillin #1 visit.   Pt. given condoms.  Pt. given educational materials re:  HIV and women, BSE, nutrition, diet and exercise.  Prior Medications: ATRIPLA 600-200-300 MG TABS (EFAVIRENZ-EMTRICITAB-TENOFOVIR) Take 1 tablet by mouth at bedtime ZOLOFT 100 MG TABS (SERTRALINE HCL) Take 1 tablet by mouth once a day ZOLOFT 50 MG TABS (SERTRALINE HCL) Take 1 tablet by mouth once a day KEPPRA () as directed bu neurology ENSURE  LIQD (NUTRITIONAL SUPPLEMENTS) one can two times a day CIPROFLOXACIN HCL 500 MG TABS (CIPROFLOXACIN HCL) Take 1 tablet by mouth two times a day x 7 days Current Allergies: No known allergies  Orders Added: 1)  T-PAP [facility]) [16109] 2)  Est. Patient Level I [60454]
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
## 2010-06-11 NOTE — Assessment & Plan Note (Signed)
Summary: Hep A and Flu/jc  Prior Medications: ATRIPLA 600-200-300 MG TABS (EFAVIRENZ-EMTRICITAB-TENOFOVIR) Take 1 tablet by mouth at bedtime ZOLOFT 100 MG TABS (SERTRALINE HCL) Take 1 tablet by mouth once a day ZOLOFT 50 MG TABS (SERTRALINE HCL) Take 1 tablet by mouth once a day KEPPRA () as directed bu neurology ENSURE  LIQD (NUTRITIONAL SUPPLEMENTS) one can two times a day Current Allergies: No known allergies  Immunizations Administered:  Hepatitis A Vaccine # 1:    Vaccine Type: HepA    Site: right deltoid    Mfr: GlaxoSmithKline    Dose: 0.5 ml    Route: IM    Given by: Wendall Mola CMA ( AAMA)    Exp. Date: 04/22/2012    Lot #: [id number]    VIS given: 07/28/04 version given January 22, 2010.  Influenza Vaccine # 1:    Vaccine Type: Fluvax Non-MCR    Site: left deltoid    Mfr: Novartis    Dose: 0.5 ml    Route: IM    Given by: Wendall Mola CMA ( AAMA)    Exp. Date: 08/09/2010    Lot #: [id number]    VIS given: 12/02/09 version given January 22, 2010.  Flu Vaccine Consent Questions:    Do you have a history of severe allergic reactions to this vaccine? no    Any prior history of allergic reactions to egg and/or gelatin? no    Do you have a sensitivity to the preservative Thimersol? no    Do you have a past history of Guillan-Barre Syndrome? no    Do you currently have an acute febrile illness? no    Have you ever had a severe reaction to latex? no    Vaccine information given and explained to patient? yes    Are you currently pregnant? no  Orders Added: 1)  Hepatitis A Vaccine (Adult Dose) [90632] 2)  Admin 1st Vaccine [90471] 3)  Influenza Vaccine NON MCR [00028]
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
## 2010-06-11 NOTE — Assessment & Plan Note (Signed)
Summary: Bicillian Inj. #2/jc  Prior Medications: ATRIPLA 600-200-300 MG TABS (EFAVIRENZ-EMTRICITAB-TENOFOVIR) Take 1 tablet by mouth at bedtime ZOLOFT 100 MG TABS (SERTRALINE HCL) Take 1 tablet by mouth once a day ZOLOFT 50 MG TABS (SERTRALINE HCL) Take 1 tablet by mouth once a day KEPPRA () as directed bu neurology ENSURE  LIQD (NUTRITIONAL SUPPLEMENTS) one can two times a day CIPROFLOXACIN HCL 500 MG TABS (CIPROFLOXACIN HCL) Take 1 tablet by mouth two times a day x 7 days Current Allergies: No known allergies  Appended Document: Bicillian Inj. #2/jc   Medication Administration  Injection # 1:    Medication: Bicillin LA 1.2 million units Injection    Diagnosis: SYPHILIS, EARLY, SYMPTOMATIC, SECONDARY NOS (ICD-091.9)    Route: IM    Site: LUOQ gluteus    Exp Date: 10/08/2012    Lot #: [id number]    Mfr: king    Patient tolerated injection without complications    Given by: Starleen Arms CMA (April 20, 2010 3:12 PM)  Injection # 2:    Medication: Bicillin LA 1.2 million units Injection    Diagnosis: SYPHILIS, EARLY, SYMPTOMATIC, SECONDARY NOS (ICD-091.9)    Route: IM    Site: RUOQ gluteus    Exp Date: 10/08/2012    Lot #: [id number]    Mfr: king    Patient tolerated injection without complications    Given by: Starleen Arms CMA (April 20, 2010 3:13 PM)  Orders Added: 1)  Bicillin LA 1.2 million units Injection [J0561] 2)  Bicillin LA 1.2 million units Injection [J0561] 3)  Admin of Therapeutic Inj  intramuscular or subcutaneous [27253]

## 2010-06-11 NOTE — Assessment & Plan Note (Signed)
Summary: Bicillian Inj. #3/jc  Prior Medications: ATRIPLA 600-200-300 MG TABS (EFAVIRENZ-EMTRICITAB-TENOFOVIR) Take 1 tablet by mouth at bedtime ZOLOFT 100 MG TABS (SERTRALINE HCL) Take 1 tablet by mouth once a day ZOLOFT 50 MG TABS (SERTRALINE HCL) Take 1 tablet by mouth once a day KEPPRA () as directed bu neurology ENSURE  LIQD (NUTRITIONAL SUPPLEMENTS) one can two times a day CIPROFLOXACIN HCL 500 MG TABS (CIPROFLOXACIN HCL) Take 1 tablet by mouth two times a day x 7 days Current Allergies: No known allergies  Medication Administration  Injection # 1:    Medication: Bicillin LA 1.2 million units Injection    Diagnosis: SYPHILIS, EARLY, SYMPTOMATIC, SECONDARY NOS (ICD-091.9)    Route: IM    Site: RUOQ gluteus    Exp Date: 10/08/2012    Lot #: [id number]    Mfr: king    Patient tolerated injection without complications    Given by: Wendall Mola CMA ( AAMA) (April 27, 2010 4:00 PM)  Injection # 2:    Medication: Bicillin LA 1.2 million units Injection    Diagnosis: SYPHILIS, EARLY, SYMPTOMATIC, SECONDARY NOS (ICD-

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
## 2010-07-01 ENCOUNTER — Encounter: Payer: Self-pay | Admitting: Infectious Diseases

## 2010-07-21 NOTE — Letter (Signed)
Summary: [facility]  [facility]   Imported By: Florinda Marker 07/17/2010 12:24:04  _____________________________________________________________________  External Attachment:    Type:   Image     Comment:   External Document

## 2010-07-22 LAB — T-HELPER CELL (CD4) - (RCID CLINIC ONLY): CD4 % Helper T Cell: 30 % — ABNORMAL LOW (ref 33–55)

## 2010-07-24 LAB — T-HELPER CELL (CD4) - (RCID CLINIC ONLY): CD4 T Cell Abs: 410 uL (ref 400–2700)

## 2010-08-03 LAB — T-HELPER CELL (CD4) - (RCID CLINIC ONLY): CD4 % Helper T Cell: 34 % (ref 33–55)

## 2010-08-14 LAB — T-HELPER CELL (CD4) - (RCID CLINIC ONLY): CD4 T Cell Abs: 640 uL (ref 400–2700)

## 2010-08-15 LAB — DIFFERENTIAL
Basophils Absolute: 0 10*3/uL (ref 0.0–0.1)
Basophils Relative: 1 % (ref 0–1)
Eosinophils Relative: 3 % (ref 0–5)
Monocytes Absolute: 0.6 10*3/uL (ref 0.1–1.0)

## 2010-08-15 LAB — CBC
HCT: 34.7 % — ABNORMAL LOW (ref 36.0–46.0)
Hemoglobin: 11.6 g/dL — ABNORMAL LOW (ref 12.0–15.0)
MCHC: 33.5 g/dL (ref 30.0–36.0)
Platelets: 142 10*3/uL — ABNORMAL LOW (ref 150–400)
RDW: 14.9 % (ref 11.5–15.5)

## 2010-08-15 LAB — BASIC METABOLIC PANEL
BUN: 7 mg/dL (ref 6–23)
CO2: 24 mEq/L (ref 19–32)
Calcium: 8.6 mg/dL (ref 8.4–10.5)
Glucose, Bld: 98 mg/dL (ref 70–99)
Potassium: 3.8 mEq/L (ref 3.5–5.1)
Sodium: 138 mEq/L (ref 135–145)

## 2010-08-18 ENCOUNTER — Other Ambulatory Visit (INDEPENDENT_AMBULATORY_CARE_PROVIDER_SITE_OTHER): Payer: Medicaid Other

## 2010-08-18 DIAGNOSIS — B20 Human immunodeficiency virus [HIV] disease: Secondary | ICD-10-CM

## 2010-08-18 DIAGNOSIS — Z113 Encounter for screening for infections with a predominantly sexual mode of transmission: Secondary | ICD-10-CM

## 2010-08-18 LAB — CBC WITH DIFFERENTIAL/PLATELET
Basophils Relative: 1 % (ref 0–1)
Eosinophils Absolute: 0.1 10*3/uL (ref 0.0–0.7)
Eosinophils Relative: 2 % (ref 0–5)
HCT: 38.9 % (ref 36.0–46.0)
Hemoglobin: 12.5 g/dL (ref 12.0–15.0)
MCH: 27.7 pg (ref 26.0–34.0)
MCHC: 32.1 g/dL (ref 30.0–36.0)
MCV: 86.3 fL (ref 78.0–100.0)
Monocytes Absolute: 0.3 10*3/uL (ref 0.1–1.0)
Monocytes Relative: 8 % (ref 3–12)

## 2010-08-19 LAB — COMPLETE METABOLIC PANEL WITH GFR
Albumin: 4.4 g/dL (ref 3.5–5.2)
Alkaline Phosphatase: 56 U/L (ref 39–117)
BUN: 13 mg/dL (ref 6–23)
Creat: 0.89 mg/dL (ref 0.40–1.20)
GFR, Est Non African American: >60 mL/min (ref >60)
Glucose, Bld: 92 mg/dL (ref 70–99)
Potassium: 5.4 mEq/L — ABNORMAL HIGH (ref 3.5–5.3)
Total Bilirubin: 0.3 mg/dL (ref 0.3–1.2)

## 2010-08-19 LAB — RPR: RPR Ser Ql: REACTIVE — AB

## 2010-08-19 LAB — RPR TITER: RPR Titer: 1:2 {titer}

## 2010-09-01 ENCOUNTER — Ambulatory Visit (INDEPENDENT_AMBULATORY_CARE_PROVIDER_SITE_OTHER): Payer: Medicaid Other | Admitting: Adult Health

## 2010-09-01 ENCOUNTER — Encounter: Payer: Self-pay | Admitting: Internal Medicine

## 2010-09-01 ENCOUNTER — Ambulatory Visit: Payer: Self-pay | Admitting: Adult Health

## 2010-09-01 DIAGNOSIS — Z79899 Other long term (current) drug therapy: Secondary | ICD-10-CM

## 2010-09-01 DIAGNOSIS — Z8659 Personal history of other mental and behavioral disorders: Secondary | ICD-10-CM

## 2010-09-01 DIAGNOSIS — K056 Periodontal disease, unspecified: Secondary | ICD-10-CM

## 2010-09-01 DIAGNOSIS — B20 Human immunodeficiency virus [HIV] disease: Secondary | ICD-10-CM

## 2010-09-01 NOTE — Progress Notes (Signed)
Subjective:    Patient ID: [name] [name], female    DOB: 11-28-1955, 55 y.o.   MRN: [id number]  HPI presents to clinic for routine followup. States has been feeling well with no physical complaints. She has been adherent to her antiretroviral medications with good tolerance. Caregiver was present during the visit, who states that her dementia is no better or worse, but relatively unchanged since the last time she was seen in this clinic.    Review of Systems  Constitutional: Negative.   HENT: Positive for dental problem. Negative for hearing loss, ear pain, nosebleeds, congestion, sore throat, facial swelling, rhinorrhea, sneezing, drooling, mouth sores, trouble swallowing, neck pain, neck stiffness, voice change, postnasal drip, sinus pressure, tinnitus and ear discharge.   Eyes: Negative.   Respiratory: Negative.   Cardiovascular: Negative.   Gastrointestinal: Negative.   Genitourinary: Negative.   Musculoskeletal: Negative.   Skin: Negative.   Neurological: Negative for dizziness, tremors, seizures, syncope, facial asymmetry, speech difficulty, weakness, light-headedness, numbness and headaches.  Hematological: Negative.   Psychiatric/Behavioral: Negative for suicidal ideas, hallucinations, behavioral problems, confusion, sleep disturbance, self-injury, dysphoric mood, decreased concentration and agitation. The patient is not nervous/anxious and is not hyperactive.        Objective:   Physical Exam  Constitutional: She is oriented to person, place, and time. She appears well-developed and well-nourished. No distress.  HENT:  Head: Normocephalic and atraumatic.  Right Ear: External ear normal.  Left Ear: External ear normal.  Mouth/Throat: Abnormal dentition. Dental caries present. No dental abscesses.       Halitosis with gingival hyperplasia. Excessive plaque noted over the dental surfaces.  Eyes: Conjunctivae and EOM are normal. Pupils are equal, round, and reactive to light.    Neck: Normal range of motion. Neck supple.  Cardiovascular: Normal rate, regular rhythm, normal heart sounds and intact distal pulses.   Pulmonary/Chest: Effort normal and breath sounds normal.  Abdominal: Soft. Bowel sounds are normal.  Musculoskeletal: Normal range of motion.  Neurological: She is alert and oriented to person, place, and time. She has normal strength. No cranial nerve deficit. Coordination and gait normal.       Significant deficits in short, remote and long-term memory. Specifically, was unable to address certain instructions that were provided to her during the course of her clinic visit.  Skin: Skin is warm and dry.  Psychiatric: She has a normal mood and affect. Her speech is normal and behavior is normal. Judgment and thought content normal. Cognition and memory are impaired. She exhibits abnormal recent memory and abnormal remote memory.          Assessment & Plan:  1. HIV. Labs from 08/18/2010 CD4 count 540 at 30% with an HIV viral load of less than 20 copies per mL. Currently stable on her antiretroviral regimen with good adherence in good tolerance. Recommend continuing present management. She is to return to clinic in 10 weeks for repeat staging labs, as well as a fasting lipid panel with a followup in 3 months.  2. Periodontal Disease. Would recommend at present she schedule an appointment for the dental clinic to have her dental issues evaluated. Paperwork was completed.  3. Dementia. No apparent changes according to her caregiver. She is also followed by Dr. Thad Ranger from neurology, and we recommend her continuing followup with him.  4. Routine Health Maintenance. She is instructed to schedule both Pap smear as well as mammogram. We will assist with scheduling a mammogram for her and she can schedule a  Pap smear at the time she checks out from clinic today.  She and her caregiver verbally acknowledge the information given and agreed with plan of care.

## 2010-09-11 ENCOUNTER — Ambulatory Visit (INDEPENDENT_AMBULATORY_CARE_PROVIDER_SITE_OTHER): Payer: Medicaid Other | Admitting: *Deleted

## 2010-09-11 ENCOUNTER — Other Ambulatory Visit (HOSPITAL_COMMUNITY)
Admission: RE | Admit: 2010-09-11 | Discharge: 2010-09-11 | Disposition: A | Discharge status: Home / Self Care | Payer: Medicaid Other | Source: Ambulatory Visit | Attending: Infectious Diseases | Admitting: Infectious Diseases

## 2010-09-11 DIAGNOSIS — Z124 Encounter for screening for malignant neoplasm of cervix: Secondary | ICD-10-CM

## 2010-09-11 DIAGNOSIS — Z01419 Encounter for gynecological examination (general) (routine) without abnormal findings: Secondary | ICD-10-CM | POA: Insufficient documentation

## 2010-09-11 NOTE — Patient Instructions (Signed)
  Results will be ready in about 1 week.  The Center will contact you by mail if everything is normal.  If your MD needs to refer you we will contact you with that appt.

## 2010-09-21 ENCOUNTER — Telehealth: Payer: Self-pay | Admitting: *Deleted

## 2010-09-21 ENCOUNTER — Other Ambulatory Visit: Payer: Self-pay | Admitting: Adult Health

## 2010-09-21 DIAGNOSIS — Z1231 Encounter for screening mammogram for malignant neoplasm of breast: Secondary | ICD-10-CM

## 2010-09-21 NOTE — Telephone Encounter (Signed)
Patient scheduled Mammogram at [facility] [place] for 09/24/10 at 2:00 pm Wendall Mola CMA

## 2010-09-24 ENCOUNTER — Ambulatory Visit
Admission: RE | Admit: 2010-09-24 | Discharge: 2010-09-24 | Disposition: A | Discharge status: Home / Self Care | Payer: Medicaid Other | Source: Ambulatory Visit | Attending: Adult Health | Admitting: Adult Health

## 2010-09-24 DIAGNOSIS — Z1231 Encounter for screening mammogram for malignant neoplasm of breast: Secondary | ICD-10-CM

## 2010-09-25 NOTE — Op Note (Signed)
[facility]  Patient:    [name] [name], [name] [name]                      MRN: [id number] Proc. Date: 06/09/00 Adm. Date:  [id number] Attending:  Lubertha South                           Operative Report  PREOPERATIVE DIAGNOSES: 1. Left ankle dog bite wounds. 2. Left open grade II lateral malleolus fracture due to dog bite.  POSTOPERATIVE DIAGNOSES: 1. Left ankle dog bite wounds. 2. Left open grade II lateral malleolus fracture due to dog bite.  OPERATION:  Irrigation and debridement of the left ankle, lateral malleolus open fracture site.  Closure over drains.  SURGEON:  Kerrin Champagne, M.D.  ANESTHESIA:  GOT.  ANESTHESIOLOGIST:  Mack Guise, M.D.  ESTIMATED BLOOD LOSS:  10 cc.  WOUNDS:  Medial and anterior wounds appear to be healing well.  Lateral ankle wound, however, shows some minimal purulence.  Therefore, I did not proceed with internal fixation.  The patient does have a fracture of her lateral malleolus that does have bone loss evident at the fracture site.  This may require bone grafting and plate fixation later.  We will perform x-rays to further evaluate.  DESCRIPTION OF PROCEDURE:  After adequate general anesthesia, the left lower extremity was prepped with Betadine scrub and prep solution, draped in the usual manner.  The patients drains were all removed prior to prep.  On examination under anesthesia, all wounds appeared to be in good condition with the exception of the lateral ankle wound which showed some continued erythema and drainage over the posterior aspect of the ankle area.  With this occurring, sutures were removed along the left lateral ankle and the wound opened.  This was opened down to bone.  The bone was curetted loosely.  There was no purulence at the fracture site, some extending into the lateral aspect and posterior aspect of the wound near the region of the pre-Achilles tendon. This area was irrigated with copious  amounts of antibiotic irrigant solution. It was debrided lightly of devitalized tissue.  Next, the soft tissues were reapproximated over the bone and tendon areas using interrupted 2-0 Vicryl sutures.  The Penrose drains were then inserted over the lateral malleolus fracture site and over the pre Achilles region.  The skin flap was then approximated using flaps stitches of 4-0 nylon.  Note, the wound over the anterior aspect of the left distal tibia was examined and found to have a puncture wound associated with it.  This area was irrigated with copious amounts of irrigant solution and a Penrose drain placed anteriorly as well.  A total of three Penrose drains were left in place with procedure.  Adaptic, 4 x 4s, ABD pads were then affixed to the skin with Kerlix and a well-padded short leg splint was applied.  The patient was then reactivated and returned to the recovery room in satisfactory condition.  Total tourniquet time was 250 mmHg was 20 minutes. DD:  06/09/00 TD:  06/09/00 Job: [id number]

## 2010-09-25 NOTE — Group Therapy Note (Signed)
[name] [name], BURKMAN NO.:  [id number]   MEDICAL RECORD NO.:  [id number]          PATIENT TYPE:  WOC   LOCATION:  [facility]                   FACILITY:  [facility]   PHYSICIAN:  Elsie Lincoln, MD      DATE OF BIRTH:  18-Jan-1956   DATE OF SERVICE:                                    CLINIC NOTE   The patient is a 55 year old G1, P1, 0-0-1, who's LMP was 2003, who presents  from outpatient clinic for a Pap smear.  The patient is a very poor  historian but states that she has not had a period since 2003 but has never  had any menopausal symptoms.  She denies any vaginal spotting or bleeding  since her last menstrual period in 2003.  She denies any gynecological  complaints, no leakage of urine, no vaginal dryness.  She is not sexually  active.  She just wants her Pap smear today.   PAST MEDICAL HISTORY:  HIV, hypertension, seizure disorder, and I do have  correspondence here are that says she has a history of Hep B and Hep C, and  polysubstance abuse, but she does not offer that up today.   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  However, I found  this on my own on the E-chart.  The patient has no recollection of this.  She is not sexually active currently.   SOCIAL HISTORY:  She lives at home with her mom, who helps her take her HIV  meds.  She denies any drinking or drugs, but occasionally does get high, but  definitely not on a bunch of drugs.   ALLERGIES:  DENIES.   MEDICATIONS:  A bunch of HIV meds per patient, and high blood pressure  medication, but does not know the names.   REVIEW OF SYMPTOMS:  All negative.   PHYSICAL EXAMINATION:  VITAL SIGNS:  Temperature 98, pulse 81, blood  pressure 119/82, weight 93.9, height 4 foot 11 inches.  GENERAL:  Well-nourished, well-developed in no apparent distress.  HEENT:  Normocephalic, atraumatic.  Throat is clear.  No  exudates.  NECK:  Supple, no masses.  Trachea in the midline.  BREAST:  Soft, no masses, non-tender, no lymphadenopathy.  ABDOMEN:  Soft, non-tender, non-distended, no inguinal adenopathy.  GENITALIA:  Tanner 5, vagina pink, normal rugae, does not appear highly  atrophic.  CERVIX:  Closed, non-tender.  UTERUS:  Is small, mobile, and non-tender.  ADNEXA:  No masses, non-tender.  RECTOVAGINAL:  No nodularity.  EXTREMITIES:  No edema and non-tender.   ASSESSMENT/PLAN:  [age]-year-old G1, P1 menopausal female for Pap  smear.  1.  Pap smear and gesic chlamydia cultures today.  2.  Mammogram ordered.  3.  Stool cards given.  4.  Return to clinic in one year.           ______________________________  Elsie Lincoln, MD     KL/MEDQ  D:  03/30/2005  T:  03/30/2005  Job:  [id number]

## 2010-09-25 NOTE — Op Note (Signed)
[facility]  Patient:    [name], [name]                      MRN: [id number] Proc. Date: 06/07/00 Adm. Date:  [id number] Attending:  Lubertha South                           Operative Report  PREOPERATIVE DIAGNOSES:  Open left grade 1 lateral malleolus fracture due to a dog bite injury with multiple dog bite wounds over the anterior, medial, and lateral aspect of the left ankle.  POSTOPERATIVE DIAGNOSES:  Open left grade 1 lateral malleolus fracture due to a dog bite injury with multiple dog bite wounds over the anterior, medial, and lateral aspect of the left ankle.  PROCEDURE:  Incision, irrigation and debridement of the left lateral malleolus open fracture site with closure over a Penrose drain. Irrigation and debridement of multiple dog bite wounds, left ankle anteriorly, medially, and laterally. The patients total length of incision lacerations repaired 26 cm.  SURGEON:  Dr. Vira Browns.  ANESTHESIA:  General, Denanny.  ESTIMATED BLOOD LOSS:  25 cc.  DRAINS:  Two Penrose drains medial aspect, left ankle. One Penrose drain over the anterior aspect of the left ankle and one Penrose drain over the lateral aspect of the left ankle. Another Penrose drain over the anterior lateral aspect of the left ankle.  TOTAL TOURNIQUET TIME:  250 mmHg 39 minutes.  BRIEF CLINICAL NOTE:  A 55 year old female with a history of HIV. She reportedly was along [address] tonight and a bulldog broke its chain and bit the patient on the left ankle. She struggled to get away from the dog sustaining a bite to the left ankle. Seen in the emergency room where initially felt to have lacerations of the left ankle with exposure of tendon only with attempts at closure of her wounds. It was noted that she had a left lateral malleolus fracture. Plain radiographs confirmed the left lateral malleolus fracture and she was brought to the operating room to  undergo irrigation and debridement of the left lateral malleolus open grade 2 fracture. The anterior wound that was irrigated and debrided in the emergency room and closed remained closed during this procedure.  DESCRIPTION OF PROCEDURE:  After adequate general anesthesia, the left lower extremity prepped with Betadine scrub and prep solution. A Penrose drain and sutures left in place over the left anterior ankle. Draped in the usual manner, she was given standard preoperative antibiotics, Ancef she received in the emergency room. Her initial incision irrigation and debridement was from the flap and was a V type flap over the lateral aspect of the left ankle with its base distally. Initial irrigation and debridement removing any devitalized tissue was performed. Hematoma was debrided from the fracture site as well as the posterior aspect of the left ankle along the perineal tendon region. Cultures were obtained following irrigation over the entire area including bony area as well as tendinous area and soft tissue area. Following debridement of all devitalized tissue, Penrose drains were then placed at the depth of the incision, one over the anterior lateral aspect of the ankle and one over the posterior aspect of the ankle extending up along the subcutaneous area superficial to the tendo Achilles. Next, the soft tissues overlying the tendon were carefully reapproximated using 3-0 Vicryl suture tagging them over the tendon to protect it. The skin was then tacked down  using flap stitches from superior to inferior.  Debridement was then carried out over two small areas over the anterior aspect of the tibia. Note that the Penrose drain exiting over the anterolateral aspect of the left distal tibia was placed through a dog bite exit wound. This to drain this area as well as the deeper area. The lateral aspect of the ankle examined and showed two transverse lacerations both about 3-4 cm in  length. Each of these were debrided and then irrigated with copious amounts of antibiotic solution. They extended deeply almost to tendon. Irrigation was performed, debridement of soft tissue and it appeared devitalized. Following irrigation then a Penrose drain was placed in the depth of each of these lacerations and the skin areas were then closed using interrupted simple sutures of 4-0 nylon. The anterior incision that was closed previously with its Penrose drain appeared in good condition. Adaptic was then applied to all areas over the anterior lateral and medial aspects of the ankle. Then 4 x 4s and ABD pads affixed to the skin with sterile Webril. A well padded posterior short leg splint was then applied to the patients leg. Note that manipulation was performed of the ankle forcing the valgus deformity and the varus in neutral position. There is definite bone loss at the fracture site. It does appear as though bone was scraped or removed off the lateral aspect of the lateral cortex of the fibula probably related to the dog bite itself. This area was curetted during the surgery. The patient was then returned to the recovery room in satisfactory condition. All instrument and sponge counts were correct. Surgical specimen, culture material, left lateral malleolus open fracture wound. DD:  06/07/00 TD:  06/07/00 Job: [id number]

## 2010-09-25 NOTE — Discharge Summary (Signed)
[facility]  Patient:    [name] [name], [name] [name]                      MRN: [id number] Adm. Date:  [id number] Disc. Date: 06/13/00 Attending:  Ephriam Knuckles H Dictator:   Dorie Rank, P.A. CC:         Fransisco Hertz, M.D.   Discharge Summary  The patient was placed on IV Unasyn.  Dr. Lina Sayre felt that this would be appropriate coverage for her dog bite.  The patients pain was well controlled throughout her hospital stay with IV morphine and Robaxin. By discharge, the patient was requiring nothing for pain.  Postoperatively, the patient was placed on a nonweightbearing status to the left lower extremity.  She could walk with a rolling walker and physical therapy worked with her throughout her hospital stay.  On June 13, 2000, she was ambulating 150 feet.  The patient did receive a rabies vaccination while in the hospital.  Her home medications were reviewed and restarted by Dr. Maurice March.  Care management worked with the patient in ordering her durable medical equipment and assisting in home health dressing changes and to set her up for finishing out the series of five of her rabies vaccination.  On June 13, 2000, after cultures were checked and seemed to be negative, the wound was found to be healing well on dressing changes, and it was felt that the patient could be discharged home.  Prior to her discharge, Xeroform was applied to the wounds on the left ankle and around the sutures.  Also, 4 x 4s and Kerlix were applied to the wounds, and well-padded _______ wrap was placed and reinforced with an ASO ankle brace.  LABORATORY AND X-RAY DATA:  Hemoglobin and hematocrit on June 07, 2000, was 10.7 and 32.6 respectively.  On June 10, 2000, hemoglobin was 9.6, hematocrit was 28.5.  ESR on June 07, 2000, was 48.  On June 07, 2000, PT was 13.5, INR 1.1, PTT 22.  BMET on June 07, 2000, was within normal limits other than AST of 48 and a glucose of  118.  UA on June 07, 2000, showed a moderate leukocyte esterase with trace ketones.  Cultures from the left ankle wound showed no growth.  Grams smear showed no organism seen. Anaerobic culture on June 07, 2000, showed no organisms, no anaerobes isolated.  EKG from June 07, 2000, showed normal sinus rhythm, nonspecific T wave abnormalities.  This was confirmed by Dr. Peter Swaziland.  Radiology:  A two-view chest x-ray on June 07, 2000, showed no acute processes.  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  Soft tissue gas was noted.  Left ankle x-rays on June 09, 2000, revealed no change in position and alignment of the distal left fibular fracture with splint.  CONDITION ON DISCHARGE:  Improved.  DISPOSITION:  The patient was discharged home.  SPECIAL DISCHARGE INSTRUCTIONS:  She was to have a home health R.N. for dressing changes daily x 10 days.  It was also set up for her to finish up her rabies vaccination through the home health R.N.  She was to receive vaccination #4 on June 20, 2000, and vaccination #5 in a series of five on July 04, 2000.  Discharge planning will work with the patient to set this up.  DISCHARGE FOLLOWUP:  She was to follow up with Dr. Otelia Sergeant  the Thursday following discharge.  She was to call for an appointment.  DISCHARGE MEDICATIONS: 1. She was given a prescription for Augmentin 750 one p.o. b.i.d. #60.  She    was instructed to take all of these. 2. She was also given instructions to take one enteric-coated aspirin b.i.d.    x 4 weeks. 3. She was also given a prescription for Vicodin 5 mg #30 one to two p.o.    q.4-6h. for pain. 4. She was to continue her home medications.  DISCHARGE INSTRUCTIONS:  She was placed on nonweightbearing status to the left lower extremity, her IV was discontinued.  She was given crutches.  Regular diet.  She  was able to shower, but she was to keep her wound dry and clean. DD:  07/11/00 TD:  07/12/00 Job: [id number]

## 2010-09-25 NOTE — Discharge Summary (Signed)
[facility]  Patient:    [name] [name], [name] [name]                      MRN: [id number] Adm. Date:  [id number] Disc. Date: 06/13/00 Attending:  Ephriam Knuckles H Dictator:   Dorie Rank, P.A.                           Discharge Summary  ADMITTING DIAGNOSES: 1. Left open ankle fracture due to a dog bite. 2. Human immunodeficiency virus. 3. History of hypertension. 4. Anxiety.  DISCHARGE DIAGNOSES: 1. Left ankle fracture, status post irrigation and debridement with negative    cultures. 2. Human immunodeficiency virus positive. 3. Hypertension. 4. Anxiety.  PROCEDURES: 1. On June 07, 2000, the patient underwent an incision irrigation and    debridement of the left malleolus open fracture site with closure over    Penrose drain.  Irrigation and debridement of multiple dog bite wounds of    left ankle anteriorly, medially and laterally.  The patients total length    of incision laceration was 26 cm.  The surgeon was Dr. Vira Browns.    General anesthesia. 2. On June 09, 2000, the patient back to the operating room again for    irrigation and debridement of the left ankle, lateral malleolus open    fracture site.  Closure of her drain.  The surgeon was Dr. Vira Browns.    Anesthesia was general.  There were no complications.  CONSULTATIONS:  Dr. Lina Sayre, infectious disease, was consulted for the soft tissue infection of the left ankle secondary to the dog bite wound and also to monitor her HIV while in the hospital.  HISTORY OF PRESENT ILLNESS:  This is a 55 year old, African-American female who sustained a dog bite on June 07, 2000, to the left ankle.  She was being chased by a dog and fell and was subsequently bit by the dog.  She was transported to [facility] ER for lacerations and possible ankle fracture. X-rays there revealed a lateral malleolus fracture with mild valgus angulation.  It was felt at that point that the patient, due to her  grade 2 open lateral malleolar fracture, would benefit from undergoing I&D of the left ankle with possible ORIF.  HOSPITAL COURSE:  The patient was admitted on June 07, 2000.  She was brought to the operating room for I&D of the left ankle.  Four Penrose drains were placed into the left ankle.  These were kept in place until the next surgery on June 09, 2000.  After the surgery on June 09, 2000, a drain was placed anteriorly and laterally over the ankle.  These were kept in place until June 13, 2000, at which time they were discontinued.  There was found to be little erythema or purulent drainage over the incision and Penrose drain site at the time of discharge.  The wound was cultured on June 09, 2000, and was monitored closely throughout her hospital stay for any type of growth. The patients cultures and Gram stains were negative and monitored throughout her hospital stay.  Postoperatively, DD:  07/05/00 TD:  07/06/00 Job: [id number]

## 2010-09-25 NOTE — Discharge Summary (Signed)
[facility]  Patient:    MONICE, LUNDY                      MRN: [id number] Adm. Date:  [id number] Disc. Date: [id number] Attending:  Garnette Scheuermann Dictator:   Melba Coon, M.D. CC:         Fransisco Hertz, M.D.  Gwenlyn Perking, M.D.   Discharge Summary  DISCHARGE DIAGNOSES: 1. Dilantin toxicity. 2. Human immunodeficiency virus. 3. Hepatitis B and C. 4. Polysubstance abuse.  DISCHARGE MEDICATIONS: 1. Combivir one tablet p.o. b.i.d. 2. Sustiva three tablets q.h.s. 3. Dilantin 200 mg b.i.d. 4. Zoloft 100 mg q.d.  SUMMARY:  Please refer to the admission H&P for a more detailed admission history.  Briefly, this is a 55 year old woman with a past medical history significant for HIV, seizure disorder, and depression, who presents to the emergency department complaining of three days of shakiness, poor balance, and multiple atraumatic falls.  She denies having had any seizures and she denies having intentionally taken too much Dilantin.  REVIEW OF SYSTEMS:  Positive for queasiness and depression.  She denied any suicidal ideations or hallucinations at that time.  LABORATORIES ON ADMISSION:  A head CT was negative.  CBC with white blood cell count 4.5, hemoglobin 10.5, hematocrit 31.1, platelet count 257, 55% neutrophils, MCV 101, MCHC 33.7, and RDW 16.2.  The alcohol level was less than 10.  The dilantin level was 44.5.  The drug screen was positive for benzodiazepines and cannabis.  Vitamin B12 was 433. Sodium 146, potassium 2.0, chloride 132, bicarbonate 12, BUN 8, creatinine 0.2, glucose 51, total bilirubin 0.4, alkaline phosphatase 33, SGOT 23, SGPT 14, total protein 2.8, albumin 1.6, calcium 4.3.  Ferritin 295, folic acid greater than 20.  TSH 2.5.  HOSPITAL COURSE:  The patient was admitted to the family practice teaching service on telemetry for observation and further work-up.  #1 - DILANTIN TOXICITY:  She was  admitted to telemetry and her Dilantin was held.  She was on seizure precautions.  Her head CT was found to be negative. There was some concern that there may have been an interaction between the Zoloft and the Dilantin as that is one of the interactions.  However, the patient has been a combination of these two medications for over a year now, per her mother.  Ms. Yonts dilantin level dropped from 44.5 on the day of admission to 32.6 the following day, 28.1 the third day, and on the day of discharge, her phenytoin level was 21.9.  The patient aggressively hydrated as well to bring up several of her electrolytes that were low on admission. Ms. Lahue mother states that she is responsible for giving Aalyssa her medications.  She states that Dondrea has been taking the medications up to her room and her mother wonders whether she was taking them on a daily basis as she stated that she was.  Debras mother feels that she took a whole handful prior to the day of admission, causing her to have the presenting complaints. The patient will be discharged on her normal Dilantin regimen and will be given specific instructions to her mother that she is to watch the patient take her medications to minimize any attempt to overdose intentionally or non-intentionally.  #2 - HUMAN IMMUNODEFICIENCY VIRUS:  The patient was found to have a CD4 count of 710.  She was continued on her HIV medications, specifically the Combivir and the Sustiva  that she has been taking.  The social work team met with the patient and will be talking to the patients mother about receiving some education about HIV, as well as outpatient help via the [employer] [facility].  It was felt that this would be a excellent resource for HIV education. Apparently there are some home dynamics that have been of concern, such as make sure that the family makes her wash her hands constantly and she not allowed to touch anything.  She mainly stays in  her room and she is not allowed to have any friends over.  #3 - HEPATITIS B AND C:  This appears to be stable as the patient did not have any complaints during her hospitalization.  Her labs do not reflect an acute on chronic picture.  #4 - POLYSUBSTANCE ABUSE:  The patient did not have any signs or symptoms of withdrawal during her hospitalization.  This will also be addressed with the social worker in terms of helping fine some resources that the family can get plugged into.  DISCHARGE CONDITION:  Good.  DISPOSITION:  Discharged to home with mother.  DISCHARGE INSTRUCTIONS:  The patient was given on specific instructions in terms of activity and diet.  The mother was given specific instructions to watch the patient take her medications to make sure that she was not hoarding them.  FOLLOW-UP:  The patient will follow up with Fransisco Hertz, M.D., in one to two weeks.  The patient will also follow up with Gwenlyn Perking, M.D., at the [facility]. [facility] within the next week.  The patient and her mother voiced agreement and understanding of the above discharge plan and had no further questions. DD:  03/30/00 TD:  04/01/00 Job: [id number]

## 2010-10-01 ENCOUNTER — Encounter: Payer: Self-pay | Admitting: *Deleted

## 2010-12-23 ENCOUNTER — Other Ambulatory Visit: Payer: Self-pay | Admitting: Infectious Diseases

## 2010-12-23 ENCOUNTER — Other Ambulatory Visit: Payer: Medicaid Other

## 2010-12-23 DIAGNOSIS — Z79899 Other long term (current) drug therapy: Secondary | ICD-10-CM

## 2010-12-23 DIAGNOSIS — B20 Human immunodeficiency virus [HIV] disease: Secondary | ICD-10-CM

## 2010-12-24 LAB — CBC WITH DIFFERENTIAL/PLATELET
Eosinophils Relative: 4 % (ref 0–5)
HCT: 39.5 % (ref 36.0–46.0)
Lymphocytes Relative: 39 % (ref 12–46)
Lymphs Abs: 1.8 10*3/uL (ref 0.7–4.0)
MCV: 85.3 fL (ref 78.0–100.0)
Monocytes Absolute: 0.3 10*3/uL (ref 0.1–1.0)
RDW: 14.3 % (ref 11.5–15.5)
WBC: 4.5 10*3/uL (ref 4.0–10.5)

## 2010-12-24 LAB — COMPREHENSIVE METABOLIC PANEL
BUN: 13 mg/dL (ref 6–23)
CO2: 30 mEq/L (ref 19–32)
Calcium: 9.3 mg/dL (ref 8.4–10.5)
Chloride: 103 mEq/L (ref 96–112)
Creat: 0.79 mg/dL (ref 0.50–1.10)
Glucose, Bld: 91 mg/dL (ref 70–99)

## 2010-12-24 LAB — LIPID PANEL
HDL: 55 mg/dL (ref >39)
LDL Cholesterol: 134 mg/dL — ABNORMAL HIGH (ref 0–99)
Total CHOL/HDL Ratio: 4 Ratio
VLDL: 30 mg/dL (ref 0–40)

## 2010-12-24 LAB — T-HELPER CELL (CD4) - (RCID CLINIC ONLY): CD4 T Cell Abs: 550 uL (ref 400–2700)

## 2010-12-25 LAB — HIV-1 RNA QUANT-NO REFLEX-BLD: HIV 1 RNA Quant: <20 copies/mL (ref <20)

## 2011-01-06 ENCOUNTER — Encounter: Payer: Self-pay | Admitting: Adult Health

## 2011-01-06 ENCOUNTER — Ambulatory Visit (INDEPENDENT_AMBULATORY_CARE_PROVIDER_SITE_OTHER): Payer: Medicaid Other | Admitting: Adult Health

## 2011-01-06 VITALS — BP 107/71 | HR 82 | Temp 98.1°F | Wt 94.0 lb

## 2011-01-06 DIAGNOSIS — Z23 Encounter for immunization: Secondary | ICD-10-CM

## 2011-01-06 DIAGNOSIS — B20 Human immunodeficiency virus [HIV] disease: Secondary | ICD-10-CM

## 2011-01-06 DIAGNOSIS — Z0000 Encounter for general adult medical examination without abnormal findings: Secondary | ICD-10-CM

## 2011-01-06 NOTE — Patient Instructions (Signed)
Cholesterol Control Modern scientists have known for a long time that cholesterol levels in the body are related to coronary heart disease and that diet affects these levels. The following material helps to explain this relationship and discusses what you can do to help keep your heart healthy. Not all cholesterol is bad. Low-density lipoprotein (LDL) cholesterol is the "bad" cholesterol which may cause fatty deposits to build up inside your arteries. High-density lipoprotein (HDL) cholesterol is "good". It helps to remove the "bad" LDL cholesterol from your blood. Cholesterol is a very important risk factor for coronary heart disease. Other risk factors are high blood pressure, smoking, stress, heredity, and weight. The heart muscle gets its supply of blood through the coronary arteries. If your LDL ("bad") cholesterol is high and your HDL ("good") cholesterol is low, you have a risk factor for fatty deposits accumulating in your coronary arteries (a vessel providing blood to the heart). This leaves less room through which blood can flow. Without sufficient blood and the oxygen, the heart muscle cannot function properly, and you may feel chest pains (called angina pectoris). When a coronary artery closes up entirely, a part of the heart muscle may die (called a myocardial infarction). CHECKING CHOLESTEROL When your caregiver sends your blood to a lab to be analyzed for cholesterol, they may do a complete lipid (fat) profile. With this test the total amount of cholesterol, as well as levels of LDL and HDL, are determined. The chart below describes what the numbers should be: Test  Goal   Total Cholesterol  Less than 200 mg/dl   LDL "bad cholesterol"  Less than 160 mg/dl for those at low risk for heart disease  Less than 130 mg/dl for those at intermediate risk for heart disease. Less than 100 mg/dl for those with diabetes or at high risk for heart disease Less than 70 mg/dl for those at very high risk for  heart disease   HDL "good cholesterol"  Women: Greater than 50 mg/dl  Men: Greater than 40 mg/dl   Triglycerides  Less than 150 mg/dl   Reference: [email or web address] 05/24/06 CONTROLLING CHOLESTEROL WITH DIET Although such factors as exercise and life-style are important, the "first line of attack" is diet. That is because certain foods are known to raise cholesterol and others to lower it. So the goal for most Americans is to balance foods for their effect on cholesterol and, even more important, to replace saturated fat with other types of fat, such as monounsaturated and polyunsaturated fats, and omega-3 fatty acids. The average American takes in about 500 to 600 milligrams (mg) of cholesterol and about 40 grams (g) of saturated fat daily. Ideally, these figures should be reduced to 300 mg of cholesterol and 15 to 17 g of saturated fat, or even lower in people who have coronary artery disease or a history of heart attack. But that does not mean you have to sacrifice all your favorite foods. Today, as the table at the end of this document shows, there are good-tasting, low-fat, low-cholesterol substitutes for most of the things you like to eat. Which foods should you choose? Choose low-fat or non-fat alternatives. Choose round or loin cuts of red meat as these types of cuts are lowest in fat and cholesterol. Chicken (without the skin), fish, veal and ground [place] breast are excellent choices. Eliminate fatty meats like hotdogs and salami. Even shellfish have little or no saturated fat and so, despite their high cholesterol content, are allowable in moderation. When you eat  lean meat, poultry, or fish, have a 3 oz. portion. For baking and cooking, oils are an excellent substitute for butter. The monounsaturated oils are of particular benefit since it is believed they lower LDL (the bad cholesterol) but raise HDL. The oils you should avoid entirely are the saturated tropical oils such as  coconut and palm.   Remember to eat liberally from food groups that are naturally free of cholesterol and saturated fat, including fish, fruit, vegetables, beans, grains (barley, rice, couscous, bul-gur wheat), and pasta (without cream sauces).   IDENTIFYING FOODS THAT LOWER CHOLESTEROL . . . Soluble fiber found in fruits such as apples; vegetables such as broccoli, potatoes, and carrots; legumes such as beans, peas, and lentils; and grains such as barley may lower your cholesterol. Foods fortified with plant sterols, or phytosterol, may also lower cholesterol. You should eat at least 2g per day of these foods for a cholesterol lowering effect.   How can you identify low-cholesterol, low-fat foods at the supermarket? The key is to read package labels. Select cheeses that have only 2-3 g saturated fat per ounce. Use a heart healthy tub margarine free of partially hydrogenated oil such as Insurance claims handler. When buying baked goods (cookies, crackers), avoid partially hydrogenated oils. Breads and muffins should be made from whole grains (whole wheat or whole oat flour, instead of just "flour" or "enriched flour"). Buy non-creamy canned soups with reduced salt and no added fats.   FOOD PREPARATION TECHNIQUES . . . Never deep fry. If you must fry, either stir fry, which uses very little fat, or use the non-stick cooking sprays like Pam. When possible, broil, bake, or roast meats, and steam vegetables. Instead of dressing vegetables with butter or margarine, use lemon and herbs, applesauce and cinnamon (for squash and sweet potatoes), non-fat yogurt, salsa, and low-fat dressings for salads.   See the following table for food, cholesterol, and fat information. FOODS HIGH IN CHOLESTEROL AND/OR SATURATED FAT  LOW CHOLESTEROL/LOW-FAT SUBSTITUTES     Cholesterol  (milligrams)  Saturated Fat  (grams)    Cholesterol  (milligrams)  Saturated Fat  (grams)   Steak, marbled (3 oz)  90  11  Steak, lean (3 oz)   50  4   Hamburger (3 oz)  80  7  Hamburger, lean (3 oz)  50  5   Ham (3 oz)  53  6  Ham, lean cut (3 oz)  35  2.4   Chicken, with skin  (3 oz)      Chicken, skin removed, 3 oz       Dark meat  90  4  Dark meat  80  2   Light meat  80  2.5  Light meat  70  1   Egg (1 Large)  200-300  1.7  Egg substitutes ("Eggbeaters")  0  0   Whole Milk (1 cup)  33  5  Low-fat milk (2%)  (1 cup)  18  3         Low-fat milk (1%)  (1 cup)  10  1.5         Skim milk (1 cup)  trace  0.3   Hard Cheese (1 oz)  30  6  Skim milk cheese  (1 oz)  16  2-3   Cottage Cheese    (1 cup) (4% fat)  35  6.5  Low-fat cottage cheese (1 cup) (1% fat)  10  1.5   Ice cream (1 cup)  60  9  Sherbet (1 cup)  14  2.5         Non-fat frozen yogurt (1 cup)  0  0.3         Frozen fruit bars  0  trace   Whipped cream  (1 tbsp)  20  3.5  Non-dairy whipped toppings (1 tbsp)  trace  1   Mayonnaise  (1 tbsp)  5  2  Low-fat mayonnaise (1 tbsp)  5  1   Butter (1 tbsp)  30  7  Extra light margarines ( tbsp)  0  1   Oils (1 tbsp)      Oils (1 tbsp)       Saturated      Monounsaturated       Coconut oil  0  11.8  Olive  0  1.8         Polyunsaturated             Corn  0  1.7         Safflower  0  1.2         Sunflower  0  1.4         Soybean  0  2.4   Document Released: 04/26/2005 Document Re-Released: 02/21/2007 [facility] Patient Information 2011 [place], [place]  .Low-Fat, Low-Saturated-Fat, Low-Cholesterol Diets Food Selection Guide BREADS, CEREALS, PASTA, RICE, DRIED PEAS, AND BEANS These products are high in carbohydrates and most are low in fat. Therefore, they can be increased in the diet as substitutes for fatty foods. They too, however, contain calories and should not be eaten in excess. Cereals can be eaten for snacks as well as for breakfast.   Include foods that contain fiber (fruits, vegetables, whole grains, and legumes). Research shows that fiber may lower blood cholesterol levels, especially the water-soluble fiber  found in fruits, vegetables, oat products, and legumes. FRUITS AND VEGETABLES It is good to eat fruits and vegetables. Besides being sources of fiber, both are rich in vitamins and some minerals. They help you get the daily allowances of these nutrients. Fruits and vegetables can be used for snacks and desserts. MEATS Limit lean meat, chicken, [place], and fish to no more than 6 ounces per day. Beef, Pork, and Lamb  Use lean cuts of beef, pork, and lamb. Lean cuts include:   Extra-lean ground beef.   Arm roast.   Sirloin tip.   Center-cut ham.   Round steak.   Loin chops.   Rump roast.   Tenderloin.  Trim all fat off the outside of meats before cooking. It is not necessary to severely decrease the intake of red meat, but lean choices should be made. Lean meat is rich in protein and contains a highly absorbable form of iron. Premenopausal women, in particular, should avoid reducing lean red meat because this could increase the risk for low red blood cells (iron-deficiency anemia). Processed Meats Processed meats, such as bacon, bologna, salami, sausage, and hot dogs contain large quantities of fat, are not rich in valuable nutrients, and should not be eaten very often. Organ Meats The organ meats, such as liver, sweetbreads, kidneys, and brain are very rich in cholesterol. They should be limited. Chicken and [place] These are good sources of protein. The fat of poultry can be reduced by removing the skin and underlying fat layers before cooking. Chicken and [place] can be substituted for lean red meat in the diet. Poultry should not be fried or covered with high-fat sauces. Fish  and Shellfish Fish is a good source of protein. Shellfish contain cholesterol, but they usually are low in saturated fatty acids. The preparation of fish is important. Like chicken and [place], they should not be fried or covered with high-fat sauces. EGGS Egg yolks often are hidden in cooked and processed foods.  Egg whites contain no fat or cholesterol. They can be eaten often. Try 1 to 2 egg whites instead of whole eggs in recipes or use egg substitutes that do not contain yolk. MILK AND DAIRY PRODUCTS Use skim or 1% milk instead of 2% or whole milk. Decrease whole milk, natural, and processed cheeses. Use nonfat or low-fat (2%) cottage cheese or low-fat cheeses made from vegetable oils. Choose nonfat or low-fat (1 to 2%) yogurt. Experiment with evaporated skim milk in recipes that call for heavy cream. Substitute low-fat yogurt or low-fat cottage cheese for sour cream in dips and salad dressings. Have at least 2 servings of low-fat dairy products, such as 2 glasses of skim (or 1%) milk each day to help get your daily calcium intake. FATS AND OILS Reduce the total intake of fats, especially saturated fat. Butterfat, lard, and beef fats are high in saturated fat and cholesterol. These should be avoided as much as possible. Vegetable fats do not contain cholesterol, but certain vegetable fats, such as coconut oil, palm oil, and palm kernel oil are very high in saturated fats. These should be limited. These fats are often used in bakery goods, processed foods, popcorn, oils, and nondairy creamers. Vegetable shortenings and some peanut butters contain hydrogenated oils, which are also saturated fats. Read the labels on these foods and check for saturated vegetable oils. Unsaturated vegetable oils and fats do not raise blood cholesterol. However, they should be limited because they are fats and are high in calories. Total fat should still be limited to 30% of your daily caloric intake. Desirable liquid vegetable oils are corn oil, cottonseed oil, olive oil, canola oil, safflower oil, soybean oil, and sunflower oil. Peanut oil is not as good, but small amounts are acceptable. Buy a heart-healthy tub margarine that has no partially hydrogenated oils in the ingredients. Mayonnaise and salad dressings often are made from  unsaturated fats, but they should also be limited because of their high calorie and fat content. Seeds, nuts, peanut butter, olives, and avocados are high in fat, but the fat is mainly the unsaturated type. These foods should be limited mainly to avoid excess calories and fat. OTHER EATING TIPS Snacks  Most sweets should be limited as snacks. They tend to be rich in calories and fats, and their caloric content outweighs their nutritional value. Some good choices in snacks are graham crackers, melba toast, soda crackers, bagels (no egg), English muffins, fruits, and vegetables. These snacks are preferable to snack crackers, [place] fries, and chips. Popcorn should be air-popped or cooked in small amounts of liquid vegetable oil. Desserts Eat fruit, low-fat yogurt, and fruit ices instead of pastries, cake, and cookies. Sherbet, angel food cake, gelatin dessert, frozen low-fat yogurt, or other frozen products that do not contain saturated fat (pure fruit juice bars, frozen ice pops) are also acceptable.   COOKING METHODS Choose those methods that use little or no fat. They include:  Poaching.   Braising.   Steaming.   Grilling.   Baking.   Stir-frying.   Broiling.   Microwaving.  Foods can be cooked in a nonstick pan without added fat, or use a nonfat cooking spray in regular cookware.  Limit fried foods and avoid frying in saturated fat. Add moisture to lean meats by using water, broth, cooking wines, and other nonfat or low-fat sauces along with the cooking methods mentioned above. Soups and stews should be chilled after cooking. The fat that forms on top after a few hours in the refrigerator should be skimmed off. When preparing meals, avoid using excess salt. Salt can contribute to raising blood pressure in some people. EATING AWAY FROM HOME Order entres, potatoes, and vegetables without sauces or butter. When meat exceeds the size of a deck of cards (3 to 4 ounces), the rest can be taken  home for another meal. Choose vegetable or fruit salads and ask for low-calorie salad dressings to be served on the side. Use dressings sparingly. Limit high-fat toppings, such as bacon, crumbled eggs, cheese, sunflower seeds, and olives. Ask for heart-healthy tub margarine instead of butter. Document Released: 10/16/2001 Document Re-Released: 07/21/2009 [facility] Patient Information 2011 [place], [place]. Wound

## 2011-01-13 ENCOUNTER — Other Ambulatory Visit: Payer: Self-pay | Admitting: *Deleted

## 2011-01-13 DIAGNOSIS — B20 Human immunodeficiency virus [HIV] disease: Secondary | ICD-10-CM

## 2011-01-13 MED ORDER — ENSURE PO LIQD: 1.0000 | Freq: Two times a day (BID) | ORAL | Status: DC

## 2011-01-20 ENCOUNTER — Other Ambulatory Visit: Payer: Self-pay | Admitting: *Deleted

## 2011-01-20 DIAGNOSIS — B20 Human immunodeficiency virus [HIV] disease: Secondary | ICD-10-CM

## 2011-01-20 MED ORDER — EFAVIRENZ-EMTRICITAB-TENOFOVIR 600-200-300 MG PO TABS: 1.0000 | ORAL_TABLET | Freq: Every day | ORAL | Status: DC

## 2011-01-29 LAB — I-STAT 8, (EC8 V) (CONVERTED LAB)
Glucose, Bld: 103 — ABNORMAL HIGH
Potassium: 4
TCO2: 24
pH, Ven: 7.433 — ABNORMAL HIGH

## 2011-01-29 LAB — DIFFERENTIAL
Basophils Absolute: 0
Basophils Relative: 1
Monocytes Absolute: 0.7
Neutro Abs: 4.8

## 2011-01-29 LAB — CBC
Hemoglobin: 12.6
MCHC: 33.9
Platelets: 206
RDW: 14.4

## 2011-02-01 ENCOUNTER — Other Ambulatory Visit: Payer: Self-pay | Admitting: *Deleted

## 2011-02-01 DIAGNOSIS — B20 Human immunodeficiency virus [HIV] disease: Secondary | ICD-10-CM

## 2011-02-01 MED ORDER — ENSURE PO LIQD: 1.0000 | Freq: Two times a day (BID) | ORAL | Status: DC

## 2011-02-02 LAB — T-HELPER CELL (CD4) - (RCID CLINIC ONLY)
CD4 % Helper T Cell: 23 — ABNORMAL LOW
CD4 T Cell Abs: 320 — ABNORMAL LOW

## 2011-03-22 ENCOUNTER — Other Ambulatory Visit: Payer: Self-pay

## 2011-03-22 DIAGNOSIS — B20 Human immunodeficiency virus [HIV] disease: Secondary | ICD-10-CM

## 2011-03-23 ENCOUNTER — Other Ambulatory Visit (INDEPENDENT_AMBULATORY_CARE_PROVIDER_SITE_OTHER): Payer: Medicaid Other

## 2011-03-23 ENCOUNTER — Other Ambulatory Visit: Payer: Self-pay | Admitting: Infectious Diseases

## 2011-03-23 DIAGNOSIS — B20 Human immunodeficiency virus [HIV] disease: Secondary | ICD-10-CM

## 2011-03-24 LAB — CBC WITH DIFFERENTIAL/PLATELET
Basophils Absolute: 0.1 10*3/uL (ref 0.0–0.1)
Eosinophils Absolute: 0.1 10*3/uL (ref 0.0–0.7)
Eosinophils Relative: 3 % (ref 0–5)
MCH: 27.6 pg (ref 26.0–34.0)
MCV: 85.1 fL (ref 78.0–100.0)
Platelets: 154 10*3/uL (ref 150–400)
RDW: 13.7 % (ref 11.5–15.5)
WBC: 4.5 10*3/uL (ref 4.0–10.5)

## 2011-03-24 LAB — COMPLETE METABOLIC PANEL WITH GFR
ALT: 39 U/L — ABNORMAL HIGH (ref 0–35)
AST: 59 U/L — ABNORMAL HIGH (ref 0–37)
Creat: 0.83 mg/dL (ref 0.50–1.10)
Total Bilirubin: 0.3 mg/dL (ref 0.3–1.2)

## 2011-03-24 LAB — HIV-1 RNA QUANT-NO REFLEX-BLD

## 2011-03-24 LAB — T-HELPER CELL (CD4) - (RCID CLINIC ONLY): CD4 T Cell Abs: 520 uL (ref 400–2700)

## 2011-04-06 ENCOUNTER — Encounter: Payer: Self-pay | Admitting: Internal Medicine

## 2011-04-06 ENCOUNTER — Ambulatory Visit (INDEPENDENT_AMBULATORY_CARE_PROVIDER_SITE_OTHER): Payer: Medicaid Other | Admitting: Internal Medicine

## 2011-04-06 VITALS — BP 111/73 | HR 85 | Temp 98.1°F | Ht 60.0 in | Wt 94.8 lb

## 2011-04-06 DIAGNOSIS — Z113 Encounter for screening for infections with a predominantly sexual mode of transmission: Secondary | ICD-10-CM

## 2011-04-06 DIAGNOSIS — B20 Human immunodeficiency virus [HIV] disease: Secondary | ICD-10-CM

## 2011-04-06 DIAGNOSIS — Z23 Encounter for immunization: Secondary | ICD-10-CM

## 2011-04-06 NOTE — Assessment & Plan Note (Signed)
She continues to do well on her regimen and she was encouraged to continue with her current excellent compliance. I did confirm the need to use condoms with all sexual activity. She did have a recent mammogram which the health aide tells me was negative and will need followup in 2 years. She did have a Pap smear here in May of this year and will be due again in May of next year. The patient will return in 4 months, sooner as indicated.

## 2011-04-06 NOTE — Progress Notes (Signed)
  Subjective:    Patient ID: [name] [name], female    DOB: 1955-06-07, 55 y.o.   MRN: [id number]  HPI this patient comes in for routine followup.she has been on Atripla for several years and has remained undetectable during that time except for one blip in 2009. She tells me she has continued excellent compliance with medication and has had no significant complaints. She is here with her health aide who confirms that she's been doing well. She denies any weight loss and doesn't have a good appetite despite her small size. She denies any dysphasia, dysuria or other complaints. She's had no hospitalizations since her last visit.    Review of Systems  Constitutional: Negative for fever, chills, fatigue and unexpected weight change.  HENT: Negative for sore throat and trouble swallowing.   Respiratory: Negative for cough.   Cardiovascular: Negative for chest pain.  Gastrointestinal: Negative for nausea, abdominal pain and diarrhea.  Genitourinary: Negative for dysuria and frequency.  Musculoskeletal: Negative for myalgias and arthralgias.  Skin: Negative for rash.  Neurological: Negative for headaches.  Hematological: Negative for adenopathy.  Psychiatric/Behavioral: Negative for dysphoric mood.       Objective:   Physical Exam  Constitutional: She appears well-developed. No distress.       thin  HENT:  Mouth/Throat: Oropharynx is clear and moist. No oropharyngeal exudate.  Cardiovascular: Normal rate, regular rhythm and normal heart sounds.  Exam reveals no gallop and no friction rub.   No murmur heard. Pulmonary/Chest: Effort normal and breath sounds normal. She has no wheezes.  Abdominal: Soft. Bowel sounds are normal. There is no tenderness. There is no rebound.  Lymphadenopathy:    She has no cervical adenopathy.  Skin: Skin is warm and dry. No rash noted.  Psychiatric: She has a normal mood and affect. Her behavior is normal.          Assessment & Plan:

## 2011-04-06 NOTE — Patient Instructions (Signed)
Return in 4 months

## 2011-06-14 ENCOUNTER — Other Ambulatory Visit: Payer: Self-pay | Admitting: *Deleted

## 2011-06-14 DIAGNOSIS — B20 Human immunodeficiency virus [HIV] disease: Secondary | ICD-10-CM

## 2011-06-14 MED ORDER — ENSURE PO LIQD: 1.0000 | Freq: Two times a day (BID) | ORAL | Status: DC

## 2011-06-14 NOTE — Progress Notes (Signed)
Subjective:    Patient ID: [name] [name] is a 56 y.o. female.  Chief Complaint: HIV Follow-up Visit [name] [name] is here for follow-up of HIV infection. She is feeling unchanged since her last visit.  She claims continued adherence to therapy with good tolerance and no complications. There are not additional complaints.   Data Review: Diagnostic studies reviewed.  Review of Systems - General ROS: negative for - fatigue, hot flashes, malaise or night sweats Psychological ROS: negative for - anxiety, behavioral disorder, concentration difficulties, depression or mood swings ENT ROS: negative for - headaches, oral lesions or sore throat Breast ROS: negative for breast lumps Respiratory ROS: no cough, shortness of breath, or wheezing Cardiovascular ROS: no chest pain or dyspnea on exertion Neurological ROS: no TIA or stroke symptoms Dermatological ROS: negative for rash and skin lesion changes  Objective:   General appearance: alert, cooperative, appears stated age and no distress Head: Normocephalic, without obvious abnormality, atraumatic Eyes: conjunctivae/corneas clear. PERRL, EOM's intact. Fundi benign. Throat: lips, mucosa, and tongue normal; teeth and gums normal Resp: clear to auscultation bilaterally Cardio: regular rate and rhythm, S1, S2 normal, no murmur, click, rub or gallop GI: soft, non-tender; bowel sounds normal; no masses,  no organomegaly Neurologic: Grossly normal Psych:  No vegetative signs or delusional behaviors noted.    Laboratory: From 12/23/2010 ,  CD4 count was 550 c/cmm @ 31%. Viral load <20 copies/ml.     Assessment/Plan:   HIV DISEASE Clinically stable on current regimen. Continue present management.  Counseling provided on prevention of transmission of HIV. Condoms offered:  Yes Medication adherence discussed with patient.  Follow up visit in 3 months with labs 2 weeks prior to appointment. Patient acknowledged information provided to  them and agreed with plan of care.      Nilton Lave A. Sundra Aland, MS, [facility] [phone]  06/14/2011, 11:39 AM

## 2011-06-14 NOTE — Assessment & Plan Note (Signed)
Clinically stable on current regimen. Continue present management.  Counseling provided on prevention of transmission of HIV. Condoms offered:  Yes Medication adherence discussed with patient.  Follow up visit in 3 months with labs 2 weeks prior to appointment. Patient acknowledged information provided to them and agreed with plan of care.  

## 2011-07-21 ENCOUNTER — Other Ambulatory Visit: Payer: Medicaid Other

## 2011-07-21 DIAGNOSIS — Z113 Encounter for screening for infections with a predominantly sexual mode of transmission: Secondary | ICD-10-CM

## 2011-07-21 DIAGNOSIS — B20 Human immunodeficiency virus [HIV] disease: Secondary | ICD-10-CM

## 2011-07-22 LAB — CBC WITH DIFFERENTIAL/PLATELET
Eosinophils Absolute: 0.1 10*3/uL (ref 0.0–0.7)
Hemoglobin: 13.3 g/dL (ref 12.0–15.0)
Lymphocytes Relative: 33 % (ref 12–46)
Lymphs Abs: 1.6 10*3/uL (ref 0.7–4.0)
MCH: 27.6 pg (ref 26.0–34.0)
Monocytes Relative: 8 % (ref 3–12)
Neutrophils Relative %: 56 % (ref 43–77)
RBC: 4.82 MIL/uL (ref 3.87–5.11)
WBC: 4.8 10*3/uL (ref 4.0–10.5)

## 2011-07-22 LAB — COMPLETE METABOLIC PANEL WITH GFR
ALT: 43 U/L — ABNORMAL HIGH (ref 0–35)
AST: 67 U/L — ABNORMAL HIGH (ref 0–37)
CO2: 29 mEq/L (ref 19–32)
Creat: 0.91 mg/dL (ref 0.50–1.10)
GFR, Est African American: 83 mL/min
Total Bilirubin: 0.3 mg/dL (ref 0.3–1.2)

## 2011-07-22 LAB — GC/CHLAMYDIA PROBE AMP, URINE
Chlamydia, Swab/Urine, PCR: NEGATIVE
GC Probe Amp, Urine: NEGATIVE

## 2011-07-22 LAB — T-HELPER CELL (CD4) - (RCID CLINIC ONLY)
CD4 % Helper T Cell: 35 % (ref 33–55)
CD4 T Cell Abs: 570 uL (ref 400–2700)

## 2011-07-22 LAB — RPR: RPR Ser Ql: REACTIVE — AB

## 2011-07-26 LAB — HIV-1 RNA QUANT-NO REFLEX-BLD
HIV 1 RNA Quant: <20 copies/mL (ref <20)
HIV-1 RNA Quant, Log: <1.3 {Log} (ref <1.30)

## 2011-08-04 ENCOUNTER — Ambulatory Visit (INDEPENDENT_AMBULATORY_CARE_PROVIDER_SITE_OTHER): Payer: Medicaid Other | Admitting: Infectious Disease

## 2011-08-04 ENCOUNTER — Encounter: Payer: Self-pay | Admitting: Infectious Disease

## 2011-08-04 ENCOUNTER — Ambulatory Visit: Payer: Medicaid Other | Admitting: Adult Health

## 2011-08-04 VITALS — BP 107/72 | HR 84 | Temp 97.9°F | Ht 60.0 in | Wt 95.2 lb

## 2011-08-04 DIAGNOSIS — Z8659 Personal history of other mental and behavioral disorders: Secondary | ICD-10-CM

## 2011-08-04 DIAGNOSIS — R569 Unspecified convulsions: Secondary | ICD-10-CM

## 2011-08-04 DIAGNOSIS — A5149 Other secondary syphilitic conditions: Secondary | ICD-10-CM

## 2011-08-04 DIAGNOSIS — F1420 Cocaine dependence, uncomplicated: Secondary | ICD-10-CM

## 2011-08-04 DIAGNOSIS — B20 Human immunodeficiency virus [HIV] disease: Secondary | ICD-10-CM

## 2011-08-04 DIAGNOSIS — Z8619 Personal history of other infectious and parasitic diseases: Secondary | ICD-10-CM

## 2011-08-04 NOTE — Assessment & Plan Note (Signed)
Appears to be multifactorial has aide helping her
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
## 2011-08-04 NOTE — Assessment & Plan Note (Signed)
Sp treatment with appropriate drop in RPR to 1:2

## 2011-08-04 NOTE — Assessment & Plan Note (Signed)
Pt with perfect control!

## 2011-08-04 NOTE — Assessment & Plan Note (Signed)
On truvada 

## 2011-08-04 NOTE — Patient Instructions (Signed)
Please make appt to meet with [facility] counselor with re to counselling re the PTSD

## 2011-08-04 NOTE — Assessment & Plan Note (Addendum)
Still a problem per aide (at least suspected)

## 2011-08-04 NOTE — Assessment & Plan Note (Signed)
On keppra. Seeing [facility]

## 2011-09-06 ENCOUNTER — Telehealth: Payer: Self-pay | Admitting: *Deleted

## 2011-09-06 NOTE — Telephone Encounter (Signed)
Message left.  Pt needs PAP smear appt.

## 2011-09-14 ENCOUNTER — Encounter: Payer: Self-pay | Admitting: *Deleted

## 2011-09-24 ENCOUNTER — Ambulatory Visit (INDEPENDENT_AMBULATORY_CARE_PROVIDER_SITE_OTHER): Payer: Medicaid Other | Admitting: *Deleted

## 2011-09-24 ENCOUNTER — Other Ambulatory Visit (HOSPITAL_COMMUNITY)
Admission: RE | Admit: 2011-09-24 | Discharge: 2011-09-24 | Disposition: A | Discharge status: Home / Self Care | Payer: Medicaid Other | Source: Ambulatory Visit | Attending: Infectious Disease | Admitting: Infectious Disease

## 2011-09-24 DIAGNOSIS — Z124 Encounter for screening for malignant neoplasm of cervix: Secondary | ICD-10-CM

## 2011-09-24 DIAGNOSIS — Z01419 Encounter for gynecological examination (general) (routine) without abnormal findings: Secondary | ICD-10-CM | POA: Insufficient documentation

## 2011-09-24 NOTE — Patient Instructions (Signed)
Your results will be ready in about a week.  I will mail them to you.  Thank you for coming to the Center for your care today.  Angelique Blonder

## 2011-09-24 NOTE — Progress Notes (Signed)
  Subjective:     [name] [name] is a 56 y.o. woman who comes in today for a  pap smear only.  Contraception:  condoms  Objective:    There were no vitals taken for this visit.  Postmenopausal.  Whitish vaginal discharge present. Pelvic Exam: Pap smear obtained.   Assessment:    Screening pap smear.   Plan:    Follow up in one year, or as indicated by Pap results. Pt given condoms. Pt given educational materials re: HIV and women, PAP smear, BSE, Mammograms, diet, nutrition, heart disease, exercise, self-esteem.

## 2011-09-29 ENCOUNTER — Encounter: Payer: Self-pay | Admitting: *Deleted

## 2011-11-15 ENCOUNTER — Other Ambulatory Visit: Payer: Self-pay | Admitting: Licensed Clinical Social Worker

## 2011-11-15 DIAGNOSIS — B20 Human immunodeficiency virus [HIV] disease: Secondary | ICD-10-CM

## 2011-11-15 MED ORDER — ENSURE PO LIQD: 1.0000 | Freq: Two times a day (BID) | ORAL | Status: DC

## 2011-11-17 ENCOUNTER — Other Ambulatory Visit: Payer: Medicaid Other

## 2011-11-17 DIAGNOSIS — B20 Human immunodeficiency virus [HIV] disease: Secondary | ICD-10-CM

## 2011-11-17 LAB — CBC WITH DIFFERENTIAL/PLATELET
Basophils Absolute: 0 10*3/uL (ref 0.0–0.1)
Eosinophils Absolute: 0.2 10*3/uL (ref 0.0–0.7)
Eosinophils Relative: 4 % (ref 0–5)
HCT: 33.2 % — ABNORMAL LOW (ref 36.0–46.0)
MCH: 28.1 pg (ref 26.0–34.0)
MCHC: 35.2 g/dL (ref 30.0–36.0)
MCV: 79.6 fL (ref 78.0–100.0)
Monocytes Absolute: 0.3 10*3/uL (ref 0.1–1.0)
Platelets: 127 10*3/uL — ABNORMAL LOW (ref 150–400)
RDW: 14.5 % (ref 11.5–15.5)

## 2011-11-17 LAB — LIPID PANEL
Cholesterol: 185 mg/dL (ref 0–200)
HDL: 44 mg/dL (ref >39)
Total CHOL/HDL Ratio: 4.2 Ratio

## 2011-11-18 LAB — RPR: RPR Ser Ql: REACTIVE — AB

## 2011-11-18 LAB — COMPLETE METABOLIC PANEL WITH GFR
Albumin: 4 g/dL (ref 3.5–5.2)
Alkaline Phosphatase: 61 U/L (ref 39–117)
BUN: 10 mg/dL (ref 6–23)
CO2: 27 mEq/L (ref 19–32)
GFR, Est African American: >89 mL/min
GFR, Est Non African American: 80 mL/min
Glucose, Bld: 105 mg/dL — ABNORMAL HIGH (ref 70–99)
Total Bilirubin: 0.2 mg/dL — ABNORMAL LOW (ref 0.3–1.2)
Total Protein: 7.5 g/dL (ref 6.0–8.3)

## 2011-11-18 LAB — T-HELPER CELL (CD4) - (RCID CLINIC ONLY): CD4 T Cell Abs: 510 uL (ref 400–2700)

## 2011-12-01 ENCOUNTER — Ambulatory Visit (INDEPENDENT_AMBULATORY_CARE_PROVIDER_SITE_OTHER): Payer: Medicaid Other | Admitting: Internal Medicine

## 2011-12-01 ENCOUNTER — Encounter: Payer: Self-pay | Admitting: Internal Medicine

## 2011-12-01 VITALS — BP 108/62 | HR 88 | Temp 98.1°F | Wt 97.0 lb

## 2011-12-01 DIAGNOSIS — B20 Human immunodeficiency virus [HIV] disease: Secondary | ICD-10-CM

## 2011-12-01 DIAGNOSIS — Z8619 Personal history of other infectious and parasitic diseases: Secondary | ICD-10-CM

## 2011-12-01 DIAGNOSIS — A5149 Other secondary syphilitic conditions: Secondary | ICD-10-CM

## 2011-12-01 DIAGNOSIS — R569 Unspecified convulsions: Secondary | ICD-10-CM

## 2011-12-01 NOTE — Progress Notes (Signed)
  Subjective:    Patient ID: [name] [name], female    DOB: [birth year], 56 y.o.   MRN: [id number]  HPI She comes in for followup of her HIV.  She continues on her Atripla. She is here with her caregiver who provides her her pillbox and thinks she does occasionally miss doses. The patient's mother is the one who administers the medication. Patient though has no complaints today and feels well. No recent hospitalizations. No known seizures recently though the patient herself is unaware when she does have seizures. No known recent drug use. The patient does have underlying dementia.     Review of Systems  Constitutional: Negative for fever, appetite change and fatigue.  HENT: Negative for sore throat and trouble swallowing.   Respiratory: Negative for cough, shortness of breath and wheezing.   Cardiovascular: Negative for chest pain, palpitations and leg swelling.  Gastrointestinal: Negative for nausea, abdominal pain, diarrhea and constipation.  Musculoskeletal: Negative for myalgias, joint swelling and arthralgias.  Skin: Negative for rash.  Neurological: Negative for dizziness, weakness and headaches.  Hematological: Negative for adenopathy.  Psychiatric/Behavioral: Negative for decreased concentration. The patient is not nervous/anxious.        Objective:   Physical Exam  Constitutional: She appears well-developed and well-nourished. No distress.  HENT:  Mouth/Throat: Oropharynx is clear and moist. No oropharyngeal exudate.  Cardiovascular: Normal rate, regular rhythm and normal heart sounds.  Exam reveals no gallop and no friction rub.   No murmur heard. Pulmonary/Chest: Effort normal and breath sounds normal. No respiratory distress. She has no wheezes. She has no rales.  Abdominal: Soft. Bowel sounds are normal. She exhibits no distension. There is no tenderness. There is no rebound.  Lymphadenopathy:    She has no cervical adenopathy.  Skin: Skin is warm and dry. No rash noted.            Assessment & Plan:

## 2011-12-01 NOTE — Assessment & Plan Note (Signed)
She is doing well on her current regimen. She is reminded to use condoms with sexual activity. She will return to clinic in 4-6 months.

## 2011-12-01 NOTE — Assessment & Plan Note (Signed)
Her RPR remained stable at 1-2 after successful treatment.

## 2011-12-01 NOTE — Assessment & Plan Note (Signed)
Continues to take Keppra and her caregiver thinks it is working well. Follows with neurology per

## 2011-12-01 NOTE — Assessment & Plan Note (Signed)
On Truvada 

## 2012-01-18 ENCOUNTER — Other Ambulatory Visit: Payer: Self-pay | Admitting: Adult Health

## 2012-04-11 ENCOUNTER — Other Ambulatory Visit (INDEPENDENT_AMBULATORY_CARE_PROVIDER_SITE_OTHER): Payer: Medicaid Other

## 2012-04-11 ENCOUNTER — Ambulatory Visit (INDEPENDENT_AMBULATORY_CARE_PROVIDER_SITE_OTHER): Payer: Medicaid Other

## 2012-04-11 DIAGNOSIS — B20 Human immunodeficiency virus [HIV] disease: Secondary | ICD-10-CM

## 2012-04-11 DIAGNOSIS — Z23 Encounter for immunization: Secondary | ICD-10-CM

## 2012-04-12 LAB — COMPLETE METABOLIC PANEL WITH GFR
ALT: 38 U/L — ABNORMAL HIGH (ref 0–35)
Albumin: 4.5 g/dL (ref 3.5–5.2)
Alkaline Phosphatase: 50 U/L (ref 39–117)
GFR, Est Non African American: 84 mL/min
Glucose, Bld: 90 mg/dL (ref 70–99)
Potassium: 4 mEq/L (ref 3.5–5.3)
Sodium: 140 mEq/L (ref 135–145)
Total Protein: 8 g/dL (ref 6.0–8.3)

## 2012-04-12 LAB — CBC WITH DIFFERENTIAL/PLATELET
Basophils Relative: 1 % (ref 0–1)
Hemoglobin: 12.7 g/dL (ref 12.0–15.0)
Lymphs Abs: 1.6 10*3/uL (ref 0.7–4.0)
MCHC: 34.7 g/dL (ref 30.0–36.0)
Monocytes Relative: 9 % (ref 3–12)
Neutro Abs: 2.5 10*3/uL (ref 1.7–7.7)
Neutrophils Relative %: 53 % (ref 43–77)
RBC: 4.57 MIL/uL (ref 3.87–5.11)

## 2012-04-12 LAB — T-HELPER CELL (CD4) - (RCID CLINIC ONLY)
CD4 % Helper T Cell: 29 % — ABNORMAL LOW (ref 33–55)
CD4 T Cell Abs: 540 uL (ref 400–2700)

## 2012-04-13 LAB — HIV-1 RNA QUANT-NO REFLEX-BLD
HIV 1 RNA Quant: <20 copies/mL (ref <20)
HIV-1 RNA Quant, Log: <1.3 {Log} (ref <1.30)

## 2012-04-24 ENCOUNTER — Telehealth: Payer: Self-pay | Admitting: *Deleted

## 2012-04-24 ENCOUNTER — Ambulatory Visit (INDEPENDENT_AMBULATORY_CARE_PROVIDER_SITE_OTHER): Payer: Medicaid Other | Admitting: Internal Medicine

## 2012-04-24 ENCOUNTER — Encounter: Payer: Self-pay | Admitting: Internal Medicine

## 2012-04-24 VITALS — BP 110/77 | HR 90 | Temp 98.1°F | Ht 60.0 in | Wt 97.0 lb

## 2012-04-24 DIAGNOSIS — B20 Human immunodeficiency virus [HIV] disease: Secondary | ICD-10-CM

## 2012-04-24 DIAGNOSIS — Z8619 Personal history of other infectious and parasitic diseases: Secondary | ICD-10-CM

## 2012-04-24 DIAGNOSIS — Z139 Encounter for screening, unspecified: Secondary | ICD-10-CM

## 2012-04-24 MED ORDER — ENSURE PO LIQD: 1.0000 | Freq: Two times a day (BID) | ORAL | Status: DC

## 2012-04-24 NOTE — Assessment & Plan Note (Addendum)
She continues to do well with the regimen. Will continue the same and return in 6 months.  She will be scheduled or a screening colonoscopy

## 2012-04-24 NOTE — Telephone Encounter (Signed)
Called and spoke with patient's Mother regarding her referral to GI.  The nurse visit is scheduled for 05/02/12 at 11:00 and the procedure for 05/11/12 at 10:30 with Dr. Rhea Belton at [facility]. Wendall Mola CMA

## 2012-04-24 NOTE — Progress Notes (Signed)
  Subjective:    Patient ID: [name] [name], female    DOB: 12/16/1955, 56 y.o.   MRN: [id number]  HPI She comes in for follow up of HIV. She continues with Atripla and is here with her caregiver who states she feels she is getting all of her medication. She does get her medication given to her by her mother. No recent hospitalizations or other new issues. She does get her Pap smear regularly. She has not had a screening colonoscopy.     Review of Systems  Constitutional: Negative for fever, chills, fatigue and unexpected weight change.  HENT: Negative for sore throat and trouble swallowing.   Respiratory: Negative for cough and shortness of breath.   Cardiovascular: Negative for chest pain, palpitations and leg swelling.  Gastrointestinal: Negative for nausea, abdominal pain and diarrhea.  Musculoskeletal: Negative for myalgias, joint swelling and arthralgias.  Skin: Negative for rash.  Neurological: Negative for dizziness and headaches.       Objective:   Physical Exam  Constitutional: She appears well-developed and well-nourished. No distress.  Cardiovascular: Normal rate, regular rhythm and normal heart sounds.  Exam reveals no gallop and no friction rub.   No murmur heard. Pulmonary/Chest: Effort normal and breath sounds normal. No respiratory distress. She has no wheezes. She has no rales.  Lymphadenopathy:    She has no cervical adenopathy.          Assessment & Plan:

## 2012-04-24 NOTE — Assessment & Plan Note (Signed)
I will check a hepatitis B surface antigen next visit to see if she needs screening ultrasounds.

## 2012-04-25 ENCOUNTER — Ambulatory Visit: Payer: Medicaid Other | Admitting: Internal Medicine

## 2012-05-11 ENCOUNTER — Encounter: Payer: Medicaid Other | Admitting: Internal Medicine

## 2012-05-22 ENCOUNTER — Encounter: Payer: Self-pay | Admitting: Internal Medicine

## 2012-05-23 ENCOUNTER — Ambulatory Visit (INDEPENDENT_AMBULATORY_CARE_PROVIDER_SITE_OTHER): Payer: Medicaid Other | Admitting: Internal Medicine

## 2012-05-23 ENCOUNTER — Ambulatory Visit: Payer: Medicaid Other | Admitting: Internal Medicine

## 2012-05-23 ENCOUNTER — Encounter: Payer: Self-pay | Admitting: Internal Medicine

## 2012-05-23 VITALS — BP 88/58 | HR 84 | Ht 60.0 in | Wt 95.6 lb

## 2012-05-23 DIAGNOSIS — F1490 Cocaine use, unspecified, uncomplicated: Secondary | ICD-10-CM

## 2012-05-23 DIAGNOSIS — Z1211 Encounter for screening for malignant neoplasm of colon: Secondary | ICD-10-CM

## 2012-05-23 DIAGNOSIS — F1410 Cocaine abuse, uncomplicated: Secondary | ICD-10-CM

## 2012-05-23 NOTE — Progress Notes (Signed)
Patient ID: ANSHI JALLOH, female   DOB: 1955-10-23, 57 y.o.   MRN: [id number]  SUBJECTIVE: HPI Ms. Rampy is a 57 year old female with a past medical history of HIV currently on anti-retroviral therapy and substance abuse who is seen in consultation at the request of Dr. Luciana Axe for consideration screening colonoscopy. She is accompanied today by health aid. The patient lives with her mother who is approximately 28 years old. The patient has never had a screening colonoscopy and is without specific complaint today. He denies diarrhea, constipation, abdominal pain. She denies rectal bleeding and melena. She reports her weight has been stable. Her appetite fluctuates, but she denies nausea and vomiting.  She does report continued intermittent use of crack cocaine.  She reports last smoking crack "last week". Her health aid reports that she does not have access to illicit drugs in her home, but when she goes out of her home in her ask with her friends, she also reports having used crack.  She reports absolute compliance with her HIV medications  Review of Systems  As per history of present illness, otherwise negative   Past Medical History  Diagnosis Date  . HIV infection   . Substance abuse     Current Outpatient Prescriptions  Medication Sig Dispense Refill  . ATRIPLA 600-200-300 MG per tablet TAKE ONE TABLET AT BEDTIME  30 each  6  . ENSURE (ENSURE) Take 1 Can by mouth 2 (two) times daily between meals.  237 mL  5  . levETIRAcetam (KEPPRA) 500 MG tablet Take 500 mg by mouth 2 (two) times daily. Per Dr. Levert Feinstein       . Multiple Vitamin (MULTIVITAMIN) tablet Take 1 tablet by mouth daily.        . sertraline (ZOLOFT) 50 MG tablet Take 150 mg by mouth daily. 3 tablets         No Known Allergies  Family History  Problem Relation Age of Onset  . Colon cancer Neg Hx     History  Substance Use Topics  . Smoking status: Former Games developer  . Smokeless tobacco: Never Used  . Alcohol Use: No    --see history of present illness, current crack cocaine use  OBJECTIVE: BP 88/58  Pulse 84  Ht 5' (1.524 m)  Wt 95 lb 9.6 oz (43.364 kg)  BMI 18.67 kg/m2 Constitutional: Well-developed thin female in no acute distress. HEENT: Normocephalic and atraumatic. Oropharynx is clear and moist. No oropharyngeal exudate. Conjunctivae are normal. No scleral icterus. Cardiovascular: Normal rate, regular rhythm and intact distal pulses. No M/R/G Pulmonary/chest: Effort normal and breath sounds normal. No wheezing, rales or rhonchi. Abdominal: Soft, nontender, nondistended. Bowel sounds active throughout. There are no masses palpable. No hepatosplenomegaly. Extremities: no clubbing, cyanosis, or edema Neurological: Alert and oriented to person place and time. Skin: Skin is warm and dry. No rashes noted. Psychiatric: Normal mood and affect. Behavior is normal.  Labs and Imaging -- CBC    Component Value Date/Time   WBC 4.7 04/11/2012 1517   RBC 4.57 04/11/2012 1517   HGB 12.7 04/11/2012 1517   HCT 36.6 04/11/2012 1517   PLT 160 04/11/2012 1517   MCV 80.1 04/11/2012 1517   MCH 27.8 04/11/2012 1517   MCHC 34.7 04/11/2012 1517   RDW 14.8 04/11/2012 1517   LYMPHSABS 1.6 04/11/2012 1517   MONOABS 0.4 04/11/2012 1517   EOSABS 0.1 04/11/2012 1517   BASOSABS 0.0 04/11/2012 1517    ASSESSMENT AND PLAN:  57 year old female with a  past medical history of HIV currently on anti-retroviral therapy and substance abuse who is seen in consultation at the request of Dr. Luciana Axe for consideration screening colonoscopy.   1.  CRC screening -- the patient is 57 years old and never to risk from a colorectal cancer standpoint. Screening colonoscopy is recommended for her this time, but cannot be performed due to her ongoing use of crack cocaine. I discussed how her active use of crack cocaine makes the procedure more risky including the sedation. We have also discussed the deleterious effects of crack cocaine on her overall  health. I've encouraged her to make every attempt to stop using crack cocaine and made her aware of other programs such as narcotics anonymous. If she is able to successfully void crack cocaine, and if this can be proven by urine drug test, I am more than okay scheduling screening colonoscopy.  She understands my recommendation and why this test cannot be performed when she is actively using illicit drugs.  I will see her back in 6 month's time to rediscuss screening colonoscopy. If she has been crack cocaine free we will perform urine drug test and schedule screening colonoscopy

## 2012-05-23 NOTE — Patient Instructions (Addendum)
Return in 6 months to discuss screening colonoscopy, which can only be done if you avoid crack/Cocaine. A drug test will be required prior.

## 2012-08-18 ENCOUNTER — Other Ambulatory Visit: Payer: Self-pay | Admitting: Adult Health

## 2012-08-18 DIAGNOSIS — B20 Human immunodeficiency virus [HIV] disease: Secondary | ICD-10-CM

## 2012-09-21 ENCOUNTER — Encounter: Payer: Self-pay | Admitting: *Deleted

## 2012-09-21 ENCOUNTER — Telehealth: Payer: Self-pay | Admitting: *Deleted

## 2012-09-21 NOTE — Telephone Encounter (Signed)
Needing to contact pt to schedule PAP smear appt.  Unable to contact.  Will mail letter.

## 2012-10-09 ENCOUNTER — Other Ambulatory Visit: Payer: Medicaid Other

## 2012-10-12 ENCOUNTER — Other Ambulatory Visit: Payer: Self-pay | Admitting: *Deleted

## 2012-10-12 DIAGNOSIS — B20 Human immunodeficiency virus [HIV] disease: Secondary | ICD-10-CM

## 2012-10-12 MED ORDER — ENSURE PO LIQD: 1.0000 | Freq: Two times a day (BID) | ORAL | Status: DC

## 2012-10-23 ENCOUNTER — Ambulatory Visit (INDEPENDENT_AMBULATORY_CARE_PROVIDER_SITE_OTHER): Payer: Medicaid Other | Admitting: Internal Medicine

## 2012-10-23 ENCOUNTER — Encounter: Payer: Self-pay | Admitting: Internal Medicine

## 2012-10-23 VITALS — BP 114/76 | HR 77 | Temp 98.2°F | Ht 59.0 in | Wt 96.0 lb

## 2012-10-23 DIAGNOSIS — Z79899 Other long term (current) drug therapy: Secondary | ICD-10-CM

## 2012-10-23 DIAGNOSIS — Z113 Encounter for screening for infections with a predominantly sexual mode of transmission: Secondary | ICD-10-CM

## 2012-10-23 DIAGNOSIS — B20 Human immunodeficiency virus [HIV] disease: Secondary | ICD-10-CM

## 2012-10-23 DIAGNOSIS — Z8619 Personal history of other infectious and parasitic diseases: Secondary | ICD-10-CM

## 2012-10-23 NOTE — Progress Notes (Signed)
  Subjective:    Patient ID: [name] [name], female    DOB: 1956/01/04, 57 y.o.   MRN: [id number]  HPI She comes in for followup of her HIV. She did not have any previous labs prior to this visit. Her last labs have been good with an undetectable virus and that his CD4 count.  She has been on Atripla and denies any missed doses. She is here by herself but is unsure of what the names of her medications are. She is given her medications by her mother. She tells me she lives with her mother now. I believe she previously was in a facility that helped care for her.  She did go see gastroenterology for screening colonoscopy but endorsed ongoing crack cocaine use. She denied any drug use to me but does have a history of this.  She has had no weight loss, he does complain of some blurry vision in her right eye. No eye pain, no photophobia, no vision changes.   Review of Systems  Constitutional: Negative for fatigue and unexpected weight change.  HENT: Negative for sore throat, trouble swallowing and ear discharge.   Eyes: Negative for photophobia, pain, discharge, redness, itching and visual disturbance.  Respiratory: Negative for cough and shortness of breath.   Cardiovascular: Negative for chest pain.  Gastrointestinal: Negative for nausea and diarrhea.  Musculoskeletal: Negative for myalgias and arthralgias.  Skin: Negative for rash.  Neurological: Negative for dizziness and headaches.  Hematological: Negative for adenopathy.  Psychiatric/Behavioral: Negative for dysphoric mood.       Objective:   Physical Exam  Constitutional: She appears well-developed and well-nourished. No distress.  HENT:  Mouth/Throat: No oropharyngeal exudate.  Eyes: Conjunctivae are normal. Right eye exhibits no discharge. Left eye exhibits no discharge. No scleral icterus.  Cardiovascular: Normal rate, regular rhythm and normal heart sounds.   No murmur heard. Pulmonary/Chest: Effort normal and breath sounds normal.  No respiratory distress. She has no wheezes.  Lymphadenopathy:    She has no cervical adenopathy.  Skin: No rash noted.          Assessment & Plan:

## 2012-10-23 NOTE — Assessment & Plan Note (Signed)
She has been doing well, with her transition to her moms, will see her more regularly.  I will check today and if there are any concerns she will be returning sooner. Otherwise we'll see her in 4 months per

## 2012-10-23 NOTE — Addendum Note (Signed)
Addended by: Mariea Clonts D on: 10/23/2012 02:44 PM   Modules accepted: Orders

## 2012-10-23 NOTE — Assessment & Plan Note (Signed)
I will check a hepatitis B surface antigen to see if she has active disease

## 2012-10-24 LAB — HEPATITIS B SURFACE ANTIBODY,QUALITATIVE: Hep B S Ab: NONREACTIVE

## 2012-10-24 LAB — HEPATITIS B SURFACE ANTIGEN: Hepatitis B Surface Ag: NEGATIVE

## 2012-10-24 LAB — HIV-1 RNA QUANT-NO REFLEX-BLD
HIV 1 RNA Quant: <20 copies/mL (ref <20)
HIV-1 RNA Quant, Log: <1.3 {Log} (ref <1.30)

## 2012-10-24 LAB — T-HELPER CELL (CD4) - (RCID CLINIC ONLY)
CD4 % Helper T Cell: 30 % — ABNORMAL LOW (ref 33–55)
CD4 T Cell Abs: 450 uL (ref 400–2700)

## 2012-10-24 LAB — HEPATITIS B CORE ANTIBODY, TOTAL: Hep B Core Total Ab: POSITIVE — AB

## 2012-10-30 ENCOUNTER — Ambulatory Visit: Payer: Medicaid Other

## 2012-12-04 ENCOUNTER — Ambulatory Visit (INDEPENDENT_AMBULATORY_CARE_PROVIDER_SITE_OTHER): Payer: Medicaid Other | Admitting: *Deleted

## 2012-12-04 ENCOUNTER — Other Ambulatory Visit (HOSPITAL_COMMUNITY)
Admission: RE | Admit: 2012-12-04 | Discharge: 2012-12-04 | Disposition: A | Discharge status: Home / Self Care | Payer: Medicaid Other | Source: Ambulatory Visit | Attending: Internal Medicine | Admitting: Internal Medicine

## 2012-12-04 DIAGNOSIS — Z113 Encounter for screening for infections with a predominantly sexual mode of transmission: Secondary | ICD-10-CM | POA: Insufficient documentation

## 2012-12-04 DIAGNOSIS — Z01419 Encounter for gynecological examination (general) (routine) without abnormal findings: Secondary | ICD-10-CM | POA: Insufficient documentation

## 2012-12-04 DIAGNOSIS — Z124 Encounter for screening for malignant neoplasm of cervix: Secondary | ICD-10-CM

## 2012-12-04 NOTE — Patient Instructions (Addendum)
Your results will be ready in about a week.  I will mail them to you.  Thank you for coming to the Center for your care.  Denise,  RN 

## 2012-12-04 NOTE — Progress Notes (Signed)
  Subjective:     [name] [name] is a 57 y.o. woman who comes in today for a  pap smear only.  Previous abnormal Pap smears: no. Contraception: condoms.  Objective:    There were no vitals taken for this visit. Pelvic Exam: Pap smear obtained.   Assessment:    Screening pap smear.   Plan:    Follow up in one year, or as indicated by Pap results.  Pt given educational materials re: HIV and diet, nutrition, exercise, BSE, health promotion, self-esteem, partner protection and PAP smears. Offered condoms.

## 2012-12-11 ENCOUNTER — Encounter: Payer: Self-pay | Admitting: *Deleted

## 2013-03-08 ENCOUNTER — Other Ambulatory Visit: Payer: Self-pay | Admitting: Internal Medicine

## 2013-04-09 ENCOUNTER — Other Ambulatory Visit: Payer: Self-pay | Admitting: *Deleted

## 2013-04-10 ENCOUNTER — Other Ambulatory Visit (INDEPENDENT_AMBULATORY_CARE_PROVIDER_SITE_OTHER): Payer: Medicaid Other

## 2013-04-10 ENCOUNTER — Other Ambulatory Visit (HOSPITAL_COMMUNITY)
Admission: RE | Admit: 2013-04-10 | Discharge: 2013-04-10 | Disposition: A | Discharge status: Home / Self Care | Payer: Medicaid Other | Source: Ambulatory Visit | Attending: Internal Medicine | Admitting: Internal Medicine

## 2013-04-10 DIAGNOSIS — Z79899 Other long term (current) drug therapy: Secondary | ICD-10-CM

## 2013-04-10 DIAGNOSIS — B20 Human immunodeficiency virus [HIV] disease: Secondary | ICD-10-CM

## 2013-04-10 DIAGNOSIS — Z113 Encounter for screening for infections with a predominantly sexual mode of transmission: Secondary | ICD-10-CM | POA: Insufficient documentation

## 2013-04-10 LAB — CBC WITH DIFFERENTIAL/PLATELET
Basophils Relative: 1 % (ref 0–1)
Eosinophils Absolute: 0.1 10*3/uL (ref 0.0–0.7)
Eosinophils Relative: 3 % (ref 0–5)
Lymphs Abs: 1.6 10*3/uL (ref 0.7–4.0)
MCH: 27.3 pg (ref 26.0–34.0)
MCHC: 35 g/dL (ref 30.0–36.0)
MCV: 78 fL (ref 78.0–100.0)
Monocytes Relative: 7 % (ref 3–12)
Neutrophils Relative %: 54 % (ref 43–77)
Platelets: 157 10*3/uL (ref 150–400)
RBC: 4.4 MIL/uL (ref 3.87–5.11)

## 2013-04-10 LAB — LIPID PANEL
Cholesterol: 214 mg/dL — ABNORMAL HIGH (ref 0–200)
Total CHOL/HDL Ratio: 4.6 Ratio
Triglycerides: 163 mg/dL — ABNORMAL HIGH (ref <150)
VLDL: 33 mg/dL (ref 0–40)

## 2013-04-10 LAB — COMPLETE METABOLIC PANEL WITH GFR
ALT: 32 U/L (ref 0–35)
AST: 55 U/L — ABNORMAL HIGH (ref 0–37)
Alkaline Phosphatase: 48 U/L (ref 39–117)
BUN: 12 mg/dL (ref 6–23)
Creat: 0.82 mg/dL (ref 0.50–1.10)
Total Bilirubin: 0.3 mg/dL (ref 0.3–1.2)

## 2013-04-11 LAB — HIV-1 RNA QUANT-NO REFLEX-BLD
HIV 1 RNA Quant: <20 copies/mL (ref <20)
HIV-1 RNA Quant, Log: <1.3 {Log} (ref <1.30)

## 2013-04-11 LAB — RPR: RPR Ser Ql: REACTIVE — AB

## 2013-04-11 LAB — T-HELPER CELL (CD4) - (RCID CLINIC ONLY): CD4 T Cell Abs: 450 /uL (ref 400–2700)

## 2013-04-12 ENCOUNTER — Telehealth: Payer: Self-pay | Admitting: Neurology

## 2013-04-12 NOTE — Telephone Encounter (Signed)
Patient calling to request Keppra refill. Please call the patient.

## 2013-04-13 MED ORDER — LEVETIRACETAM 500 MG PO TABS: 500.0000 mg | ORAL_TABLET | Freq: Two times a day (BID) | ORAL | Status: DC

## 2013-04-13 NOTE — Telephone Encounter (Signed)
Patient has not been seen in Epic, Centricity is still down.  Unable to verify when last OV was or dose she was last prescribed.  I called the pharmacy.  They said they had a refill on file for Levetiracetam 500mg  one twice daily written by Eber Jones, however, since her registration application with Medicaid is still pending, they cannot fill the Rx and it has to be sent under the provider instead.  I have resent the refill under the provider.  [facility] will [facility] when Rx is ready for pick up.

## 2013-04-24 ENCOUNTER — Ambulatory Visit (INDEPENDENT_AMBULATORY_CARE_PROVIDER_SITE_OTHER): Payer: Medicaid Other | Admitting: Internal Medicine

## 2013-04-24 ENCOUNTER — Encounter: Payer: Self-pay | Admitting: Internal Medicine

## 2013-04-24 VITALS — BP 110/74 | HR 78 | Temp 98.5°F | Ht 60.0 in | Wt 99.0 lb

## 2013-04-24 DIAGNOSIS — B20 Human immunodeficiency virus [HIV] disease: Secondary | ICD-10-CM

## 2013-04-24 MED ORDER — ENSURE PO LIQD: 1.0000 | Freq: Two times a day (BID) | ORAL | Status: DC

## 2013-04-24 NOTE — Progress Notes (Signed)
  Subjective:    Patient ID: [name] [name], female    DOB: [birth year], 57 y.o.   MRN: [id number]  HPI  She comes in for followup of her HIV. She is doing well and her labs show an undetectable viral load and CD4 count that is in the normal range. She has been on Atripla and denies any missed doses. She is here with a home health aide and is given her medications by her mother. She tells me she lives with her mother now. I believe she previously was in a facility that helped care for her.  She did go see gastroenterology for screening colonoscopy but endorsed ongoing crack cocaine use.her recent Pap smear was normal. She denied any drug use to me but does have a history of this.  She has had no weight loss, he does complain of some blurry vision in her right eye. No eye pain, no photophobia, no vision changes.   Review of Systems  Constitutional: Negative for fatigue and unexpected weight change.  HENT: Negative for ear discharge, sore throat and trouble swallowing.   Eyes: Negative for photophobia, pain, discharge, redness, itching and visual disturbance.  Respiratory: Negative for cough and shortness of breath.   Cardiovascular: Negative for chest pain.  Gastrointestinal: Negative for nausea and diarrhea.  Musculoskeletal: Negative for arthralgias and myalgias.  Skin: Negative for rash.  Neurological: Negative for dizziness and headaches.  Hematological: Negative for adenopathy.  Psychiatric/Behavioral: Negative for dysphoric mood.       Objective:   Physical Exam  Constitutional: She appears well-developed and well-nourished. No distress.  HENT:  Mouth/Throat: No oropharyngeal exudate.  Eyes: Conjunctivae are normal. Right eye exhibits no discharge. Left eye exhibits no discharge. No scleral icterus.  Cardiovascular: Normal rate, regular rhythm and normal heart sounds.   No murmur heard. Pulmonary/Chest: Effort normal and breath sounds normal. No respiratory distress. She has no  wheezes.  Lymphadenopathy:    She has no cervical adenopathy.  Skin: No rash noted.          Assessment & Plan:

## 2013-04-24 NOTE — Assessment & Plan Note (Signed)
She is doing well with her current therapy and will continue. She will return in 4 months. She was given a a prescription for Ensure that she gets through her home health

## 2013-05-24 ENCOUNTER — Other Ambulatory Visit: Payer: Self-pay | Admitting: Neurology

## 2013-05-24 ENCOUNTER — Telehealth: Payer: Self-pay | Admitting: *Deleted

## 2013-05-24 MED ORDER — LEVETIRACETAM 500 MG PO TABS: 500.0000 mg | ORAL_TABLET | Freq: Two times a day (BID) | ORAL | Status: DC

## 2013-05-24 NOTE — Telephone Encounter (Signed)
I called the patient.  They are aware. Mother will call back next week to try and schedule annual appt.

## 2013-05-24 NOTE — Telephone Encounter (Signed)
Jessica:  Please notify patient that I have refilled her keppra 500mg  bid, 12 refills.

## 2013-05-24 NOTE — Addendum Note (Signed)
Addended by: Marcial Pacas on: 05/24/2013 03:49 PM   Modules accepted: Orders

## 2013-05-24 NOTE — Telephone Encounter (Signed)
Patient's aide is calling, requesting a refill of her keppra.  She is out of medication today, no refills available at the pharmacy.  This was last prescribed by her neurologist in December.  Will forward the request to their attention. Landis Gandy, RN

## 2013-05-30 ENCOUNTER — Telehealth: Payer: Self-pay | Admitting: Diagnostic Neuroimaging

## 2013-05-30 MED ORDER — SERTRALINE HCL 50 MG PO TABS: 150.0000 mg | ORAL_TABLET | Freq: Every day | ORAL | Status: DC

## 2013-05-30 MED ORDER — LEVETIRACETAM 500 MG PO TABS: 500.0000 mg | ORAL_TABLET | Freq: Two times a day (BID) | ORAL | Status: DC

## 2013-05-30 NOTE — Telephone Encounter (Signed)
PT's granddaughter called for an appointment and prescription refills for [name] [name].  There is an appointment scheduled 4/23 @ 2:00 pm with Dr. Leta Baptist.  She needs the following prescriptions refilled:  Sertraline 50 mg tabs and Keppra 500 mg tabs.  Please contact [name] [name] with any questions and to advise when [facility] have [facility] [address].  Thank You

## 2013-06-21 ENCOUNTER — Other Ambulatory Visit: Payer: Self-pay | Admitting: Diagnostic Neuroimaging

## 2013-07-02 ENCOUNTER — Other Ambulatory Visit: Payer: Self-pay | Admitting: Diagnostic Neuroimaging

## 2013-07-04 ENCOUNTER — Other Ambulatory Visit: Payer: Self-pay | Admitting: Diagnostic Neuroimaging

## 2013-07-05 ENCOUNTER — Other Ambulatory Visit: Payer: Self-pay | Admitting: Diagnostic Neuroimaging

## 2013-07-06 ENCOUNTER — Other Ambulatory Visit: Payer: Self-pay | Admitting: Diagnostic Neuroimaging

## 2013-07-06 NOTE — Telephone Encounter (Signed)
This is the 4th time a refill request has been sent to [place] for this med, which we already authorized.  I called twice and spoke with the pharmacist, each time, they said they had the Rx.  I just called again and the pharmacist said the patient already picked up the Rx, and he is not sure why they keep faxing [place].

## 2013-08-09 ENCOUNTER — Other Ambulatory Visit: Payer: Medicaid Other

## 2013-08-09 DIAGNOSIS — B20 Human immunodeficiency virus [HIV] disease: Secondary | ICD-10-CM

## 2013-08-10 LAB — T-HELPER CELL (CD4) - (RCID CLINIC ONLY)
CD4 % Helper T Cell: 29 % — ABNORMAL LOW (ref 33–55)
CD4 T Cell Abs: 480 /uL (ref 400–2700)

## 2013-08-12 LAB — HIV-1 RNA QUANT-NO REFLEX-BLD
HIV 1 RNA Quant: <20 copies/mL (ref <20)
HIV-1 RNA Quant, Log: <1.3 {Log} (ref <1.30)

## 2013-08-23 ENCOUNTER — Other Ambulatory Visit (HOSPITAL_COMMUNITY)
Admission: RE | Admit: 2013-08-23 | Discharge: 2013-08-23 | Disposition: A | Discharge status: Home / Self Care | Payer: Medicaid Other | Source: Ambulatory Visit | Attending: Internal Medicine | Admitting: Internal Medicine

## 2013-08-23 ENCOUNTER — Ambulatory Visit (INDEPENDENT_AMBULATORY_CARE_PROVIDER_SITE_OTHER): Payer: Medicaid Other | Admitting: Internal Medicine

## 2013-08-23 ENCOUNTER — Encounter: Payer: Self-pay | Admitting: Internal Medicine

## 2013-08-23 VITALS — BP 101/76 | HR 86 | Temp 98.9°F | Ht 61.0 in | Wt 103.0 lb

## 2013-08-23 DIAGNOSIS — B20 Human immunodeficiency virus [HIV] disease: Secondary | ICD-10-CM

## 2013-08-23 DIAGNOSIS — Z01419 Encounter for gynecological examination (general) (routine) without abnormal findings: Secondary | ICD-10-CM | POA: Insufficient documentation

## 2013-08-23 DIAGNOSIS — H538 Other visual disturbances: Secondary | ICD-10-CM

## 2013-08-23 DIAGNOSIS — Z1231 Encounter for screening mammogram for malignant neoplasm of breast: Secondary | ICD-10-CM

## 2013-08-23 DIAGNOSIS — R636 Underweight: Secondary | ICD-10-CM | POA: Insufficient documentation

## 2013-08-23 DIAGNOSIS — Z124 Encounter for screening for malignant neoplasm of cervix: Secondary | ICD-10-CM

## 2013-08-23 NOTE — Progress Notes (Signed)
  Subjective:    Patient ID: [name] [name], female    DOB: 04/09/1956, 58 y.o.   MRN: [id number]  HPI  She comes in for followup of her HIV. She is doing well and her labs show an undetectable viral load and CD4 count that is in the normal range. She has been on Atripla and denies any missed doses. She is here with a home health aide and is given her medications by her mother. She tells me she lives with her mother now. She previously was in a facility that helped care for her.  Her recent Pap smear was normal. .  She has had no weight loss, he does complain of some blurry vision in her right eye. No eye pain, no photophobia, no vision changes.   Review of Systems  Constitutional: Negative for fatigue and unexpected weight change.  HENT: Negative for ear discharge, sore throat and trouble swallowing.   Eyes: Negative for photophobia, pain, discharge, redness, itching and visual disturbance.  Respiratory: Negative for cough and shortness of breath.   Cardiovascular: Negative for chest pain.  Gastrointestinal: Negative for nausea and diarrhea.  Musculoskeletal: Negative for arthralgias and myalgias.  Skin: Negative for rash.  Neurological: Negative for dizziness and headaches.  Hematological: Negative for adenopathy.  Psychiatric/Behavioral: Negative for dysphoric mood.       Objective:   Physical Exam  Constitutional: She appears well-developed and well-nourished. No distress.  HENT:  Mouth/Throat: No oropharyngeal exudate.  Eyes: Conjunctivae are normal. Right eye exhibits no discharge. Left eye exhibits no discharge. No scleral icterus.  Cardiovascular: Normal rate, regular rhythm and normal heart sounds.   No murmur heard. Pulmonary/Chest: Effort normal and breath sounds normal. No respiratory distress. She has no wheezes.  Lymphadenopathy:    She has no cervical adenopathy.  Skin: No rash noted.          Assessment & Plan:

## 2013-08-23 NOTE — Assessment & Plan Note (Signed)
Stable BMI.  Patient eats and has access to food.  No indication for further Ensure.

## 2013-08-23 NOTE — Assessment & Plan Note (Addendum)
No issues.  Will continue with same and RTC 4 months.   Will need pap

## 2013-08-23 NOTE — Addendum Note (Signed)
Addended by: Lorne Skeens D on: 08/23/2013 03:05 PM   Modules accepted: Orders

## 2013-08-23 NOTE — Assessment & Plan Note (Signed)
No concerning signs.  Will refer to eye doctor

## 2013-08-30 ENCOUNTER — Ambulatory Visit (INDEPENDENT_AMBULATORY_CARE_PROVIDER_SITE_OTHER): Payer: Medicaid Other | Admitting: Diagnostic Neuroimaging

## 2013-08-30 ENCOUNTER — Encounter: Payer: Self-pay | Admitting: Diagnostic Neuroimaging

## 2013-08-30 VITALS — BP 119/81 | HR 91 | Ht 60.0 in | Wt 106.0 lb

## 2013-08-30 DIAGNOSIS — G40109 Localization-related (focal) (partial) symptomatic epilepsy and epileptic syndromes with simple partial seizures, not intractable, without status epilepticus: Secondary | ICD-10-CM

## 2013-08-30 MED ORDER — LEVETIRACETAM 500 MG PO TABS: 500.0000 mg | ORAL_TABLET | Freq: Two times a day (BID) | ORAL | Status: DC

## 2013-08-30 NOTE — Progress Notes (Signed)
[facility]  PATIENT: [name] [name] DOB: May 26, 1955  REFERRING CLINICIAN:  HISTORY FROM: patient and home health aid REASON FOR VISIT: follow up visit   HISTORICAL  CHIEF COMPLAINT:  Chief Complaint  Patient presents with  . Follow-up    memory    HISTORY OF PRESENT ILLNESS:   UPDATE 08/30/13: Since last visit, she may have had 2 seizures last month, but patient doesn't remember. This is reported second hand. Patient's 56 year old mother with dementia, thinks she heard a sound last month from the upstairs apartment and thinks patient may have had a seizure. However this was not corroborated by first hand account or by any other witnesses. Patient's home health aide has never witnessed any seizures.  UPDATE 06/13/12 (CM): patient returns for folowup with her caregiver. Last seizure over a year ago. Patient claims she stopped using crack cocaine in December 2013. Patient does not drive. She needs refills on her Keppra. She claims she has not missed doses of her meds.   PRIOR HPI (Dr. Mendel Corning): Ms. Dissinger, 58 yr old returns for follow-up. She was last seen 06/14/11. She was evaluated by Dr. Doy Mince 10/01/09 for "Seizures" which caregiver has seen increase in frequency over 2 years- occur at least 2/wk- starts w/ becoming less responsive, R hand clenches, grits teeth, will fall if standing- altered for about 1mn, then normal- unaware of event- no assoc w/ activity or time of day- no shaking all over- events stereotypical- has been on Dilantin in past, placed on Keppra by Dr. RDoy Mince No further seizure activity.  Care giver who stays 3 hrs M-Friday and gives her meds and makes sure she has food, etc.   H/o CHI in past- no known FH sz- active cocaine use-   REVIEW OF SYSTEMS: Full 14 system review of systems performed and notable only for eye discharge eye itching blurred vision memory loss seizure anxiety.  ALLERGIES: No Known Allergies  HOME  MEDICATIONS: Outpatient Prescriptions Prior to Visit  Medication Sig Dispense Refill  . ATRIPLA 600-200-300 MG per tablet TAKE 1 TABLET BY MOUTH AT BEDTIME  30 tablet  6  . levETIRAcetam (KEPPRA) 500 MG tablet Take 1 tablet (500 mg total) by mouth 2 (two) times daily.  60 tablet  3  . Multiple Vitamin (MULTIVITAMIN) tablet Take 1 tablet by mouth daily.        . sertraline (ZOLOFT) 50 MG tablet Take 3 tablets (150 mg total) by mouth daily.  90 tablet  3   No facility-administered medications prior to visit.    PAST MEDICAL HISTORY: Past Medical History  Diagnosis Date  . HIV infection   . Substance abuse   . H/O head injury     PAST SURGICAL HISTORY: Past Surgical History  Procedure Laterality Date  . Unremarkable      FAMILY HISTORY: Family History  Problem Relation Age of Onset  . Colon cancer Neg Hx   . Diabetes Mother   . Behavior problems Mother   . Headache Mother     SOCIAL HISTORY:  History   Social History  . Marital Status: Single    Spouse Name: N/A    Number of Children: 1  . Years of Education: N/A   Occupational History  .     Social History Main Topics  . Smoking status: Former SResearch scientist (life sciences) . Smokeless tobacco: Never Used  . Alcohol Use: No  . Drug Use: 7.00 per week     Comment: crack  . Sexual  Activity: Yes    Partners: Male     Comment: pt. given condoms   Other Topics Concern  . Not on file   Social History Narrative   Patient lives at home with her mother.   Caffeine Use:      PHYSICAL EXAM  Filed Vitals:   08/30/13 1413  BP: 119/81  Pulse: 91  Height: 5' (1.524 m)  Weight: 106 lb (48.081 kg)    Not recorded    Body mass index is 20.7 kg/(m^2).  GENERAL EXAM: Patient is in no distress; well developed, nourished and groomed; neck is supple; FRAIL APPEARING. APPEARS OLDER THAN STATED AGE.  CARDIOVASCULAR: Regular rate and rhythm, no murmurs, no carotid bruits  NEUROLOGIC: MENTAL STATUS: awake, alert; MMSE 17/30.  Language fluent, comprehension intact, naming intact, fund of knowledge appropriate CRANIAL NERVE: no papilledema on fundoscopic exam, pupils equal and reactive to light, visual fields full to confrontation, extraocular muscles intact, ENDGAZE NYSTAGMUS, facial sensation and strength symmetric, hearing intact, palate elevates symmetrically, uvula midline, shoulder shrug symmetric, tongue midline. MOTOR: normal bulk and tone, full strength in the BUE, BLE SENSORY: normal and symmetric to light touch COORDINATION: finger-nose-finger, fine finger movements normal REFLEXES: deep tendon reflexes present and symmetric GAIT/STATION: narrow based gait    DIAGNOSTIC DATA (LABS, IMAGING, TESTING) - I reviewed patient records, labs, notes, testing and imaging myself where available.  Lab Results  Component Value Date   WBC 4.6 04/10/2013   HGB 12.0 04/10/2013   HCT 34.3* 04/10/2013   MCV 78.0 04/10/2013   PLT 157 04/10/2013      Component Value Date/Time   NA 139 04/10/2013 1445   K 3.9 04/10/2013 1445   CL 102 04/10/2013 1445   CO2 29 04/10/2013 1445   GLUCOSE 115* 04/10/2013 1445   BUN 12 04/10/2013 1445   CREATININE 0.82 04/10/2013 1445   CREATININE 0.87 04/08/2010 2044   CALCIUM 8.9 04/10/2013 1445   PROT 7.9 04/10/2013 1445   ALBUMIN 3.9 04/10/2013 1445   AST 55* 04/10/2013 1445   ALT 32 04/10/2013 1445   ALKPHOS 48 04/10/2013 1445   BILITOT 0.3 04/10/2013 1445   GFRNONAA 81 04/10/2013 1445   GFRNONAA >60 12/26/2008 1400   GFRAA >89 04/10/2013 1445   GFRAA  Value: >60        The eGFR has been calculated using the MDRD equation. This calculation has not been validated in all clinical situations. eGFR's persistently <60 mL/min signify possible Chronic Kidney Disease. 12/26/2008 1400   Lab Results  Component Value Date   CHOL 214* 04/10/2013   HDL 47 04/10/2013   LDLCALC 134* 04/10/2013   TRIG 163* 04/10/2013   CHOLHDL 4.6 04/10/2013   No results found for this basename: HGBA1C   No results found for  this basename: VITAMINB12   No results found for this basename: TSH    10/08/09 EEG - intermittent slowing of the left fronto-temporal area; no epileptiform discharges  10/16/09 MRI brain - mild generalized atrophy which is age inappropriate. No structural lesions are noted.   ASSESSMENT AND PLAN  58 y.o. 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 Had been seizure from for almost 2 years, and then 2 possible events last month, although not directly witnessed.  Dx: complex partial seizures + dementia (? HIV associated) + history of crack cocaine abuse + history of traumatic brain injury + depression/anxiety  PLAN: - continue current medications - had long discussion about long term living arrangements; I  think patient would benefit from social work assessment  Return in about 1 year (around 08/31/2014).    Penni Bombard, MD 5/62/1308, 6:57 PM Certified in Neurology, Neurophysiology and Neuroimaging  [facility] [address] [place], [place] [address] [phone]

## 2013-08-30 NOTE — Patient Instructions (Signed)
Continue current medications. 

## 2013-09-04 ENCOUNTER — Encounter: Payer: Self-pay | Admitting: *Deleted

## 2013-09-05 ENCOUNTER — Observation Stay (HOSPITAL_COMMUNITY)
Admission: EM | Admit: 2013-09-05 | Discharge: 2013-09-06 | Disposition: A | Discharge status: Home / Self Care | Payer: Medicaid Other | Attending: Internal Medicine | Admitting: Internal Medicine

## 2013-09-05 ENCOUNTER — Observation Stay (HOSPITAL_COMMUNITY): Payer: Medicaid Other

## 2013-09-05 ENCOUNTER — Emergency Department (HOSPITAL_COMMUNITY): Payer: Medicaid Other

## 2013-09-05 ENCOUNTER — Other Ambulatory Visit: Payer: Self-pay

## 2013-09-05 ENCOUNTER — Encounter (HOSPITAL_COMMUNITY): Payer: Self-pay | Admitting: Emergency Medicine

## 2013-09-05 ENCOUNTER — Ambulatory Visit
Admission: RE | Admit: 2013-09-05 | Discharge: 2013-09-05 | Disposition: A | Discharge status: Home / Self Care | Payer: Medicaid Other | Source: Ambulatory Visit | Attending: Internal Medicine | Admitting: Internal Medicine

## 2013-09-05 DIAGNOSIS — Z8619 Personal history of other infectious and parasitic diseases: Secondary | ICD-10-CM

## 2013-09-05 DIAGNOSIS — F0390 Unspecified dementia without behavioral disturbance: Secondary | ICD-10-CM | POA: Insufficient documentation

## 2013-09-05 DIAGNOSIS — B20 Human immunodeficiency virus [HIV] disease: Secondary | ICD-10-CM

## 2013-09-05 DIAGNOSIS — R636 Underweight: Secondary | ICD-10-CM

## 2013-09-05 DIAGNOSIS — R4182 Altered mental status, unspecified: Secondary | ICD-10-CM

## 2013-09-05 DIAGNOSIS — F1420 Cocaine dependence, uncomplicated: Secondary | ICD-10-CM

## 2013-09-05 DIAGNOSIS — Z21 Asymptomatic human immunodeficiency virus [HIV] infection status: Secondary | ICD-10-CM | POA: Insufficient documentation

## 2013-09-05 DIAGNOSIS — F1010 Alcohol abuse, uncomplicated: Secondary | ICD-10-CM | POA: Insufficient documentation

## 2013-09-05 DIAGNOSIS — G40909 Epilepsy, unspecified, not intractable, without status epilepticus: Principal | ICD-10-CM

## 2013-09-05 DIAGNOSIS — F1020 Alcohol dependence, uncomplicated: Secondary | ICD-10-CM

## 2013-09-05 DIAGNOSIS — R4020 Unspecified coma: Secondary | ICD-10-CM

## 2013-09-05 DIAGNOSIS — F1410 Cocaine abuse, uncomplicated: Secondary | ICD-10-CM | POA: Insufficient documentation

## 2013-09-05 DIAGNOSIS — K056 Periodontal disease, unspecified: Secondary | ICD-10-CM

## 2013-09-05 DIAGNOSIS — H538 Other visual disturbances: Secondary | ICD-10-CM

## 2013-09-05 DIAGNOSIS — R569 Unspecified convulsions: Secondary | ICD-10-CM

## 2013-09-05 DIAGNOSIS — N898 Other specified noninflammatory disorders of vagina: Secondary | ICD-10-CM

## 2013-09-05 DIAGNOSIS — G9349 Other encephalopathy: Secondary | ICD-10-CM | POA: Insufficient documentation

## 2013-09-05 DIAGNOSIS — Z1231 Encounter for screening mammogram for malignant neoplasm of breast: Secondary | ICD-10-CM

## 2013-09-05 DIAGNOSIS — R404 Transient alteration of awareness: Secondary | ICD-10-CM

## 2013-09-05 DIAGNOSIS — F329 Major depressive disorder, single episode, unspecified: Secondary | ICD-10-CM

## 2013-09-05 DIAGNOSIS — R259 Unspecified abnormal involuntary movements: Secondary | ICD-10-CM

## 2013-09-05 DIAGNOSIS — Z87891 Personal history of nicotine dependence: Secondary | ICD-10-CM | POA: Insufficient documentation

## 2013-09-05 DIAGNOSIS — N390 Urinary tract infection, site not specified: Secondary | ICD-10-CM | POA: Insufficient documentation

## 2013-09-05 DIAGNOSIS — F3289 Other specified depressive episodes: Secondary | ICD-10-CM

## 2013-09-05 DIAGNOSIS — Z8659 Personal history of other mental and behavioral disorders: Secondary | ICD-10-CM

## 2013-09-05 DIAGNOSIS — N912 Amenorrhea, unspecified: Secondary | ICD-10-CM

## 2013-09-05 DIAGNOSIS — A5149 Other secondary syphilitic conditions: Secondary | ICD-10-CM

## 2013-09-05 HISTORY — DX: Major depressive disorder, single episode, unspecified: F32.9

## 2013-09-05 HISTORY — DX: Unspecified convulsions: R56.9

## 2013-09-05 HISTORY — DX: Unspecified dementia, unspecified severity, without behavioral disturbance, psychotic disturbance, mood disturbance, and anxiety: F03.90

## 2013-09-05 HISTORY — DX: Depression, unspecified: F32.A

## 2013-09-05 LAB — CBC WITH DIFFERENTIAL/PLATELET
BASOS PCT: 1 % (ref 0–1)
Basophils Absolute: 0.1 10*3/uL (ref 0.0–0.1)
Eosinophils Absolute: 0.1 10*3/uL (ref 0.0–0.7)
Eosinophils Relative: 2 % (ref 0–5)
HCT: 40.3 % (ref 36.0–46.0)
Hemoglobin: 13.6 g/dL (ref 12.0–15.0)
LYMPHS PCT: 28 % (ref 12–46)
Lymphs Abs: 1.6 10*3/uL (ref 0.7–4.0)
MCH: 28.4 pg (ref 26.0–34.0)
MCHC: 33.7 g/dL (ref 30.0–36.0)
MCV: 84.1 fL (ref 78.0–100.0)
Monocytes Absolute: 0.5 10*3/uL (ref 0.1–1.0)
Monocytes Relative: 8 % (ref 3–12)
NEUTROS PCT: 61 % (ref 43–77)
Neutro Abs: 3.3 10*3/uL (ref 1.7–7.7)
Platelets: 148 10*3/uL — ABNORMAL LOW (ref 150–400)
RBC: 4.79 MIL/uL (ref 3.87–5.11)
RDW: 13.6 % (ref 11.5–15.5)
WBC: 5.5 10*3/uL (ref 4.0–10.5)

## 2013-09-05 LAB — CBC
HCT: 39.9 % (ref 36.0–46.0)
HEMOGLOBIN: 13.5 g/dL (ref 12.0–15.0)
MCH: 28.2 pg (ref 26.0–34.0)
MCHC: 33.8 g/dL (ref 30.0–36.0)
MCV: 83.5 fL (ref 78.0–100.0)
Platelets: 166 10*3/uL (ref 150–400)
RBC: 4.78 MIL/uL (ref 3.87–5.11)
RDW: 13.6 % (ref 11.5–15.5)
WBC: 6.4 10*3/uL (ref 4.0–10.5)

## 2013-09-05 LAB — PROTIME-INR
INR: 0.97 (ref 0.00–1.49)
Prothrombin Time: 12.7 seconds (ref 11.6–15.2)

## 2013-09-05 LAB — RAPID URINE DRUG SCREEN, HOSP PERFORMED
AMPHETAMINES: NOT DETECTED
BENZODIAZEPINES: NOT DETECTED
Barbiturates: NOT DETECTED
COCAINE: NOT DETECTED
OPIATES: NOT DETECTED
Tetrahydrocannabinol: NOT DETECTED

## 2013-09-05 LAB — COMPREHENSIVE METABOLIC PANEL
ALBUMIN: 3.7 g/dL (ref 3.5–5.2)
ALT: 38 U/L — AB (ref 0–35)
AST: 62 U/L — AB (ref 0–37)
Alkaline Phosphatase: 66 U/L (ref 39–117)
BUN: 12 mg/dL (ref 6–23)
CHLORIDE: 102 meq/L (ref 96–112)
CO2: 24 meq/L (ref 19–32)
Calcium: 9.2 mg/dL (ref 8.4–10.5)
Creatinine, Ser: 0.63 mg/dL (ref 0.50–1.10)
GFR calc Af Amer: >90 mL/min (ref >90)
GFR calc non Af Amer: >90 mL/min (ref >90)
Glucose, Bld: 88 mg/dL (ref 70–99)
POTASSIUM: 4.2 meq/L (ref 3.7–5.3)
SODIUM: 140 meq/L (ref 137–147)
Total Bilirubin: <0.2 mg/dL — ABNORMAL LOW (ref 0.3–1.2)
Total Protein: 8.7 g/dL — ABNORMAL HIGH (ref 6.0–8.3)

## 2013-09-05 LAB — URINE MICROSCOPIC-ADD ON

## 2013-09-05 LAB — TROPONIN I: Troponin I: <0.3 ng/mL (ref <0.30)

## 2013-09-05 LAB — URINALYSIS, ROUTINE W REFLEX MICROSCOPIC
BILIRUBIN URINE: NEGATIVE
GLUCOSE, UA: NEGATIVE mg/dL
Hgb urine dipstick: NEGATIVE
Ketones, ur: NEGATIVE mg/dL
Nitrite: NEGATIVE
PROTEIN: NEGATIVE mg/dL
Specific Gravity, Urine: 1.015 (ref 1.005–1.030)
Urobilinogen, UA: 1 mg/dL (ref 0.0–1.0)
pH: 7.5 (ref 5.0–8.0)

## 2013-09-05 LAB — APTT: aPTT: 27 seconds (ref 24–37)

## 2013-09-05 LAB — CREATININE, SERUM
CREATININE: 0.59 mg/dL (ref 0.50–1.10)
GFR calc Af Amer: >90 mL/min (ref >90)

## 2013-09-05 LAB — LACTIC ACID, PLASMA: LACTIC ACID, VENOUS: 1.3 mmol/L (ref 0.5–2.2)

## 2013-09-05 LAB — CK: Total CK: 234 U/L — ABNORMAL HIGH (ref 7–177)

## 2013-09-05 LAB — ETHANOL

## 2013-09-05 MED ORDER — ONE-DAILY MULTI VITAMINS PO TABS: 1.0000 | ORAL_TABLET | Freq: Every day | ORAL | Status: DC

## 2013-09-05 MED ORDER — EFAVIRENZ-EMTRICITAB-TENOFOVIR 600-200-300 MG PO TABS
1.0000 | ORAL_TABLET | Freq: Every day | ORAL | Status: DC
  Administered 2013-09-05: 1 via ORAL
  Filled 2013-09-05 (×2): qty 1

## 2013-09-05 MED ORDER — SODIUM CHLORIDE 0.9 % IJ SOLN
3.0000 mL | Freq: Two times a day (BID) | INTRAMUSCULAR | Status: DC
  Administered 2013-09-05 – 2013-09-06 (×2): 3 mL via INTRAVENOUS

## 2013-09-05 MED ORDER — DEXTROSE 5 % IV SOLN
1.0000 g | Freq: Once | INTRAVENOUS | Status: AC
  Administered 2013-09-05: 1 g via INTRAVENOUS
  Filled 2013-09-05: qty 10

## 2013-09-05 MED ORDER — SODIUM CHLORIDE 0.9 % IV SOLN
1000.0000 mg | Freq: Once | INTRAVENOUS | Status: AC
  Administered 2013-09-05: 1000 mg via INTRAVENOUS
  Filled 2013-09-05: qty 10

## 2013-09-05 MED ORDER — ADULT MULTIVITAMIN W/MINERALS CH
1.0000 | ORAL_TABLET | Freq: Every day | ORAL | Status: DC
  Administered 2013-09-06: 1 via ORAL
  Filled 2013-09-05: qty 1

## 2013-09-05 MED ORDER — HEPARIN SODIUM (PORCINE) 5000 UNIT/ML IJ SOLN
5000.0000 [IU] | Freq: Three times a day (TID) | INTRAMUSCULAR | Status: DC
  Administered 2013-09-05: 5000 [IU] via SUBCUTANEOUS
  Filled 2013-09-05 (×5): qty 1

## 2013-09-05 NOTE — H&P (Addendum)
Hospitalist Admission History and Physical  Patient name: [name] [name] Medical record number: [id number] Date of birth: Apr 15, 1956 Age: 58 y.o. Gender: female  Primary Care Provider: Scharlene Gloss, MD  Chief Complaint: encephalopathy, ? Seizure vs. Presyncope.   History of Present Illness:This is a 58 y.o. year old female with prior history of HIV well-controlled with undetectable viral load and CD4 count within normal limits as of April 2015, seizure disorder, substance abuse presenting with encephalopathy, ? Seizure vs. Presyncope. History is primarily from home health aide as patient is mildly confused. Age states that patient went for a routine mammogram today when patient became confused again to stay out of the right side with right-sided/right upper extremity rigidity and patient fell to the ground. There was no loss of consciousness. Home health a sister patient has been more confused over the course of the day. There have been no reported fevers or chills at home. Though patient is confused, she denies any chest pains, shortness of breath, diarrhea or other systemic symptoms prior to onset. Patient is followed by [facility] neurology outpatient. Was last seen on April 23. Has been on keppra 500 BID long term. There was ? Increased seizure activity at office visit, however, none this was witnessed. Patient denies any recent drug use. In ER, patient hemodynamically stable. Confused, able as some questions but is oriented to person and place intermittently. Blood work, chest x-ray within normal limits. Head CT with no intracranial abnormality but diffuse atrophy most prominent within the cerebellum. UDS pending. Neuro consult. Preliminary recommendations are Keppra low and MRI.  Patient Active Problem List   Diagnosis Date Noted  . Blurry vision, right eye 08/23/2013  . Underweight 08/23/2013  . Periodontal disease 09/01/2010  . VAGINAL DISCHARGE 04/08/2010  . TREMOR 04/25/2007  .  AMENORRHEA 09/21/2006  . HIV DISEASE 03/11/2006  . SYPHILIS, EARLY, SYMPTOMATIC, SECONDARY NOS 03/11/2006  . DEPENDENCE, ALCOHOL NEC/NOS, UNSPECIFIED 03/11/2006  . DEPENDENCE, COCAINE, UNSPECIFIED 03/11/2006  . DEPRESSION 03/11/2006  . SEIZURE DISORDER 03/11/2006  . DEMENTIA, HX OF 03/11/2006  . HEPATITIS B, HX OF 03/11/2006   Past Medical History: Past Medical History  Diagnosis Date  . HIV infection   . Substance abuse   . H/O head injury     Past Surgical History: Past Surgical History  Procedure Laterality Date  . Unremarkable      Social History: History   Social History  . Marital Status: Single    Spouse Name: N/A    Number of Children: 1  . Years of Education: N/A   Occupational History  .     Social History Main Topics  . Smoking status: Former Research scientist (life sciences)  . Smokeless tobacco: Never Used  . Alcohol Use: No  . Drug Use: 7.00 per week     Comment: crack  . Sexual Activity: Yes    Partners: Male     Comment: pt. given condoms   Other Topics Concern  . None   Social History Narrative   Patient lives at home with her mother.   Caffeine Use:     Family History: Family History  Problem Relation Age of Onset  . Colon cancer Neg Hx   . Diabetes Mother   . Behavior problems Mother   . Headache Mother     Allergies: No Known Allergies  Current Facility-Administered Medications  Medication Dose Route Frequency Provider Last Rate Last Dose  . efavirenz-emtricitabine-tenofovir (ATRIPLA) 600-200-300 MG per tablet 1 tablet  1 tablet Oral QHS Shanda Howells,  MD      . heparin injection 5,000 Units  5,000 Units Subcutaneous 3 times per day Shanda Howells, MD      . levETIRAcetam (KEPPRA) 1,000 mg in sodium chloride 0.9 % 100 mL IVPB  1,000 mg Intravenous Once Charles B. Karle Starch, MD      . multivitamin tablet 1 tablet  1 tablet Oral Daily Shanda Howells, MD      . sodium chloride 0.9 % injection 3 mL  3 mL Intravenous Q12H Shanda Howells, MD       Current  Outpatient Prescriptions  Medication Sig Dispense Refill  . efavirenz-emtricitabine-tenofovir (ATRIPLA) 600-200-300 MG per tablet Take 1 tablet by mouth at bedtime.      . Multiple Vitamin (MULTIVITAMIN) tablet Take 1 tablet by mouth daily.        . sertraline (ZOLOFT) 50 MG tablet Take 3 tablets (150 mg total) by mouth daily.  90 tablet  3  . levETIRAcetam (KEPPRA) 500 MG tablet Take 1 tablet (500 mg total) by mouth 2 (two) times daily.  180 tablet  4   Review Of Systems: 12 point ROS negative except as noted above in HPI.  Physical Exam: Filed Vitals:   09/05/13 1745  BP: 132/73  Pulse: 82  Temp:   Resp:     General: alert and cooperative HEENT: PERRLA and extra ocular movement intact Heart: S1, S2 normal, no murmur, rub or gallop, regular rate and rhythm Lungs: clear to auscultation, no wheezes or rales and unlabored breathing Abdomen: abdomen is soft without significant tenderness, masses, organomegaly or guarding Extremities: extremities normal, atraumatic, no cyanosis or edema Skin:no rashes, no ecchymoses Neurology: normal without focal findings  Labs and Imaging: Lab Results  Component Value Date/Time   NA 140 09/05/2013  4:25 PM   K 4.2 09/05/2013  4:25 PM   CL 102 09/05/2013  4:25 PM   CO2 24 09/05/2013  4:25 PM   BUN 12 09/05/2013  4:25 PM   CREATININE 0.63 09/05/2013  4:25 PM   CREATININE 0.82 04/10/2013  2:45 PM   GLUCOSE 88 09/05/2013  4:25 PM   Lab Results  Component Value Date   WBC 5.5 09/05/2013   HGB 13.6 09/05/2013   HCT 40.3 09/05/2013   MCV 84.1 09/05/2013   PLT 148* 09/05/2013    Dg Chest 2 View  09/05/2013   CLINICAL DATA:  Seizure  EXAM: CHEST  2 VIEW  COMPARISON:  10/24/2003  FINDINGS: The heart size and mediastinal contours are within normal limits. Both lungs are clear. The visualized skeletal structures are unremarkable.  IMPRESSION: No active cardiopulmonary disease.   Electronically Signed   By: Rolm Baptise M.D.   On: 09/05/2013 17:00   Ct Head Wo  Contrast  09/05/2013   CLINICAL DATA:  Syncope, confusion.  EXAM: CT HEAD WITHOUT CONTRAST  TECHNIQUE: Contiguous axial images were obtained from the base of the skull through the vertex without intravenous contrast.  COMPARISON:  CT 12/26/2008.  MRI 10/16/2009.  FINDINGS: Global atrophy, most prominent in the cerebellum, stable since prior study. No acute intracranial abnormality. Specifically, no hemorrhage, hydrocephalus, mass lesion, acute infarction, or significant intracranial injury. No acute calvarial abnormality. Visualized paranasal sinuses and mastoids clear. Orbital soft tissues unremarkable.  IMPRESSION: Diffuse atrophy, most prominent within the cerebellum.  No acute intracranial abnormality.   Electronically Signed   By: Rolm Baptise M.D.   On: 09/05/2013 16:57     Assessment and Plan: [name] [name] is a 58 y.o. year  old female presenting with encephalopathy, ? Seizure vs. Presyncope  Encephalopathy: Presentation seems similar to previous seizure morphology in the past. Differential diagnosis also includes infectious, other neurocardiogenic and metabolic/drug sources. Neurology recommendations pending. No focal deficits on exam today. MRI of the brain without contrast. Keppra load. Increase Keppra to 750 mg twice a day. UDS pending. Cycle cardiac enzymes. Telemetry bed. Also check 2-D echo.   HIV: Follow outpatient locally. Well controlled. Continue outpatient heart therapy.  Psych: Mood stable. Continue Zoloft. Upper limits of dosing. Theoretical risk of lowering seizure threshold.  FEN/GI: heart healthy diet.  Prophylaxis: sub q heparin  Disposition: pending further evaluation  Code Status:Full Code        Shanda Howells MD  Pager: [phone]

## 2013-09-05 NOTE — ED Notes (Signed)
[facility] where she was going to have a mammogram when the patient had what was described by staff a syncopal episode where the patients eyes were open but not responding to staff. No tremors or seizure like activity noted. Patient landed on her sacrum, no head contact. Patient appears to be slow in her responses but is A/O. VSS.

## 2013-09-05 NOTE — ED Notes (Signed)
Attempted to call report

## 2013-09-05 NOTE — ED Notes (Signed)
Personal Aide at bedside.

## 2013-09-05 NOTE — ED Notes (Signed)
Patient is still in MRI 

## 2013-09-05 NOTE — ED Notes (Signed)
Report called to unit 5W

## 2013-09-05 NOTE — ED Provider Notes (Signed)
CSN: [id number]     Arrival date & time 09/05/13  1513 History   First MD Initiated Contact with Patient 09/05/13 1535     Chief Complaint  Patient presents with  . Loss of Consciousness     (Consider location/radiation/quality/duration/timing/severity/associated sxs/prior Treatment) Patient is a 58 y.o. female presenting with syncope.  Loss of Consciousness  Level 5 caveat due to confusion Pt with history of HIV and seizures brought to the ED via EMS from [place] where she was to be seen for routine mammogram. Home Health Caregiver with her now states she has been confused since she came to pick her up earlier today. She may have had some confusion last night but per caregiver was 'just really quite'. Her primary caregiver at home is her 42yo mother who also has dementia. There is a question of whether the patient had a seizure last night, mother reported hearing a 'thud' upstairs but no witnessed seizure activity then. The patient was standing at the mammogram machine, but did not seem to be able to understand the rad tech's instructions. She then had a loss of consciousness, was helped to the ground. Caregiver states she had R deviation of eyes and contraction of R hand. This has since resolved but the patient has not returned to baseline.   Past Medical History  Diagnosis Date  . HIV infection   . Substance abuse   . H/O head injury    Past Surgical History  Procedure Laterality Date  . Unremarkable     Family History  Problem Relation Age of Onset  . Colon cancer Neg Hx   . Diabetes Mother   . Behavior problems Mother   . Headache Mother    History  Substance Use Topics  . Smoking status: Former Research scientist (life sciences)  . Smokeless tobacco: Never Used  . Alcohol Use: No   OB History   Grav Para Term Preterm Abortions TAB SAB Ect Mult Living                 Review of Systems  Cardiovascular: Positive for syncope.   Unable to assess due to mental status.     Allergies   Review of patient's allergies indicates no known allergies.  Home Medications   Prior to Admission medications   Medication Sig Start Date End Date Taking? Authorizing Provider  efavirenz-emtricitabine-tenofovir (ATRIPLA) [phone] MG per tablet Take 1 tablet by mouth at bedtime.   Yes Historical Provider, MD  Multiple Vitamin (MULTIVITAMIN) tablet Take 1 tablet by mouth daily.     Yes Historical Provider, MD  sertraline (ZOLOFT) 50 MG tablet Take 3 tablets (150 mg total) by mouth daily. 05/30/13  Yes Penni Bombard, MD  levETIRAcetam (KEPPRA) 500 MG tablet Take 1 tablet (500 mg total) by mouth 2 (two) times daily. 08/30/13   Penni Bombard, MD   BP 114/80  Pulse 79  Temp(Src) 98 F (36.7 C) (Oral)  Resp 15  Ht 5' (1.524 m)  Wt 106 lb (48.081 kg)  BMI 20.70 kg/m2  SpO2 92% Physical Exam  Nursing note and vitals reviewed. Constitutional: She appears well-developed and well-nourished.  HENT:  Head: Normocephalic and atraumatic.  Eyes: EOM are normal. Pupils are equal, round, and reactive to light.  Neck: Normal range of motion. Neck supple.  Cardiovascular: Normal rate, normal heart sounds and intact distal pulses.   Pulmonary/Chest: Effort normal and breath sounds normal.  Abdominal: Bowel sounds are normal. She exhibits no distension. There is no tenderness.  Musculoskeletal: Normal range of motion. She exhibits no edema and no tenderness.  Neurological: She is alert. She has normal strength. No cranial nerve deficit or sensory deficit.  Oriented to person only  Skin: Skin is warm and dry. No rash noted.  Psychiatric: She has a normal mood and affect.    ED Course  Procedures (including critical care time) Labs Review Labs Reviewed  CBC WITH DIFFERENTIAL - Abnormal; Notable for the following:    Platelets 148 (*)    All other components within normal limits  COMPREHENSIVE METABOLIC PANEL - Abnormal; Notable for the following:    Total Protein 8.7 (*)    AST 62  (*)    ALT 38 (*)    Total Bilirubin <0.2 (*)    All other components within normal limits  URINALYSIS, ROUTINE W REFLEX MICROSCOPIC - Abnormal; Notable for the following:    APPearance CLOUDY (*)    Leukocytes, UA LARGE (*)    All other components within normal limits  CK - Abnormal; Notable for the following:    Total CK 234 (*)    All other components within normal limits  COMPREHENSIVE METABOLIC PANEL - Abnormal; Notable for the following:    Albumin 3.4 (*)    AST 52 (*)    Total Bilirubin 0.2 (*)    All other components within normal limits  CBC WITH DIFFERENTIAL - Abnormal; Notable for the following:    Platelets 147 (*)    All other components within normal limits  URINE CULTURE  PROTIME-INR  APTT  TROPONIN I  ETHANOL  URINE RAPID DRUG SCREEN (HOSP PERFORMED)  LACTIC ACID, PLASMA  CBC  CREATININE, SERUM  TROPONIN I  TROPONIN I  TROPONIN I  URINE MICROSCOPIC-ADD ON  AMMONIA    Imaging Review No results found.  EKG interpretation not crossing from MUSE ECG interpretation   Date: 09/05/2013  Rate: 80  Rhythm: normal sinus rhythm  QRS Axis: normal  Intervals: normal  ST/T Wave abnormalities: normal  Conduction Disutrbances: none  Narrative Interpretation:   Old EKG Reviewed: No significant changes noted      MDM   Final diagnoses:  Altered mental status    AMS and ?seizure. Discussed with Neuro who recommends increasing Keppra and obtaining MRI. Admit to [place] Karle Starch, MD 09/06/13 1022

## 2013-09-05 NOTE — Consult Note (Signed)
Reason for Consult: Increased confusion following an episode of loss of consciousness, with known seizure disorder.  HPI:                                                                                                                                          [name] [name] is an 58 y.o. female history of HIV infection, substance abuse, head injury and seizure disorder who was brought to the emergency room for evaluation following an episode of loss of consciousness. Episode occurred while patient was undergoing a mammogram. She reportedly was unconscious for about 5 minutes and had abnormal posturing with stiffening with right upper extremity flexed. No frank convulsions were noted. Patient was noted to be more confused than usual prior to start of a mammogram. She was even more confused on waking up following episode of loss of consciousness. She takes Keppra 500 mg twice a day and has been compliant with taking this medication. CT scan of her head showed no acute intracranial abnormality. Diffuse atrophy, particularly involving cerebellum, was noted. Routine laboratory studies were unremarkable.  Past Medical History  Diagnosis Date  . HIV infection   . Substance abuse   . H/O head injury     Past Surgical History  Procedure Laterality Date  . Unremarkable      Family History  Problem Relation Age of Onset  . Colon cancer Neg Hx   . Diabetes Mother   . Behavior problems Mother   . Headache Mother     Social History:  reports that she has quit smoking. She has never used smokeless tobacco. She reports that she uses illicit drugs about 7 times per week. She reports that she does not drink alcohol.  No Known Allergies  MEDICATIONS:                                                                                                                     I have reviewed the patient's current medications.   ROS:  History obtained from Long-term caregiver.  General ROS: negative for - chills, fatigue, fever, night sweats, weight gain or weight loss Psychological ROS: Has no short-term memory difficulty which is chronic Ophthalmic ROS: negative for - blurry vision, double vision, eye pain or loss of vision ENT ROS: negative for - epistaxis, nasal discharge, oral lesions, sore throat, tinnitus or vertigo Allergy and Immunology ROS: negative for - hives or itchy/watery eyes Hematological and Lymphatic ROS: negative for - bleeding problems, bruising or swollen lymph nodes Endocrine ROS: negative for - galactorrhea, hair pattern changes, polydipsia/polyuria or temperature intolerance Respiratory ROS: negative for - cough, hemoptysis, shortness of breath or wheezing Cardiovascular ROS: negative for - chest pain, dyspnea on exertion, edema or irregular heartbeat Gastrointestinal ROS: negative for - abdominal pain, diarrhea, hematemesis, nausea/vomiting or stool incontinence Genito-Urinary ROS: negative for - dysuria, hematuria, incontinence or urinary frequency/urgency Musculoskeletal ROS: negative for - joint swelling or muscular weakness Neurological ROS: as noted in HPI Dermatological ROS: negative for rash and skin lesion changes   Blood pressure 132/73, pulse 82, temperature 98.2 F (36.8 C), temperature source Oral, resp. rate 15, height 5' (1.524 m), weight 48.081 kg (106 lb), SpO2 95.00%.   Neurologic Examination:                                                                                                      Mental Status: Alert, to correct month but disoriented to place and current age.  Speech fluent without evidence of aphasia. Able to follow commands without difficulty. Cranial Nerves: II-Visual fields were normal. III/IV/VI-Pupils were equal and reacted. Extraocular movements were full and conjugate.    V/VII-no facial numbness and no  facial weakness. VIII-normal. X-normal speech and symmetrical palatal movement. Motor: 5/5 bilaterally with normal tone and bulk Sensory: Normal throughout. Deep Tendon Reflexes: 2+ and symmetric. Plantars: Mute bilaterally Cerebellar: Slight intention tremor with finger-to-nose testing with use of left upper extremity.  Lab Results  Component Value Date/Time   CHOL 214* 04/10/2013  2:45 PM    Results for orders placed during the hospital encounter of 09/05/13 (from the past 48 hour(s))  CBC WITH DIFFERENTIAL     Status: Abnormal   Collection Time    09/05/13  4:25 PM      Result Value Ref Range   WBC 5.5  4.0 - 10.5 K/uL   RBC 4.79  3.87 - 5.11 MIL/uL   Hemoglobin 13.6  12.0 - 15.0 g/dL   HCT 40.3  36.0 - 46.0 %   MCV 84.1  78.0 - 100.0 fL   MCH 28.4  26.0 - 34.0 pg   MCHC 33.7  30.0 - 36.0 g/dL   RDW 13.6  11.5 - 15.5 %   Platelets 148 (*) 150 - 400 K/uL   Neutrophils Relative % 61  43 - 77 %   Neutro Abs 3.3  1.7 - 7.7 K/uL   Lymphocytes Relative 28  12 - 46 %   Lymphs Abs 1.6  0.7 - 4.0 K/uL   Monocytes Relative 8  3 - 12 %   Monocytes  Absolute 0.5  0.1 - 1.0 K/uL   Eosinophils Relative 2  0 - 5 %   Eosinophils Absolute 0.1  0.0 - 0.7 K/uL   Basophils Relative 1  0 - 1 %   Basophils Absolute 0.1  0.0 - 0.1 K/uL  COMPREHENSIVE METABOLIC PANEL     Status: Abnormal   Collection Time    09/05/13  4:25 PM      Result Value Ref Range   Sodium 140  137 - 147 mEq/L   Potassium 4.2  3.7 - 5.3 mEq/L   Chloride 102  96 - 112 mEq/L   CO2 24  19 - 32 mEq/L   Glucose, Bld 88  70 - 99 mg/dL   BUN 12  6 - 23 mg/dL   Creatinine, Ser 0.63  0.50 - 1.10 mg/dL   Calcium 9.2  8.4 - 10.5 mg/dL   Total Protein 8.7 (*) 6.0 - 8.3 g/dL   Albumin 3.7  3.5 - 5.2 g/dL   AST 62 (*) 0 - 37 U/L   ALT 38 (*) 0 - 35 U/L   Alkaline Phosphatase 66  39 - 117 U/L   Total Bilirubin <0.2 (*) 0.3 - 1.2 mg/dL   GFR calc non Af Amer >90  >90 mL/min   GFR calc Af Amer >90  >90 mL/min   Comment:  (NOTE)     The eGFR has been calculated using the CKD EPI equation.     This calculation has not been validated in all clinical situations.     eGFR's persistently <90 mL/min signify possible Chronic Kidney     Disease.  PROTIME-INR     Status: None   Collection Time    09/05/13  4:25 PM      Result Value Ref Range   Prothrombin Time 12.7  11.6 - 15.2 seconds   INR 0.97  0.00 - 1.49  APTT     Status: None   Collection Time    09/05/13  4:25 PM      Result Value Ref Range   aPTT 27  24 - 37 seconds  TROPONIN I     Status: None   Collection Time    09/05/13  4:25 PM      Result Value Ref Range   Troponin I <0.30  <0.30 ng/mL   Comment:            Due to the release kinetics of cTnI,     a negative result within the first hours     of the onset of symptoms does not rule out     myocardial infarction with certainty.     If myocardial infarction is still suspected,     repeat the test at appropriate intervals.  ETHANOL     Status: None   Collection Time    09/05/13  4:25 PM      Result Value Ref Range   Alcohol, Ethyl (B) <11  0 - 11 mg/dL   Comment:            LOWEST DETECTABLE LIMIT FOR     SERUM ALCOHOL IS 11 mg/dL     FOR MEDICAL PURPOSES ONLY  LACTIC ACID, PLASMA     Status: None   Collection Time    09/05/13  4:25 PM      Result Value Ref Range   Lactic Acid, Venous 1.3  0.5 - 2.2 mmol/L  CK     Status: Abnormal   Collection Time  09/05/13  4:25 PM      Result Value Ref Range   Total CK 234 (*) 7 - 177 U/L    Dg Chest 2 View  09/05/2013   CLINICAL DATA:  Seizure  EXAM: CHEST  2 VIEW  COMPARISON:  10/24/2003  FINDINGS: The heart size and mediastinal contours are within normal limits. Both lungs are clear. The visualized skeletal structures are unremarkable.  IMPRESSION: No active cardiopulmonary disease.   Electronically Signed   By: Rolm Baptise M.D.   On: 09/05/2013 17:00   Ct Head Wo Contrast  09/05/2013   CLINICAL DATA:  Syncope, confusion.  EXAM: CT HEAD  WITHOUT CONTRAST  TECHNIQUE: Contiguous axial images were obtained from the base of the skull through the vertex without intravenous contrast.  COMPARISON:  CT 12/26/2008.  MRI 10/16/2009.  FINDINGS: Global atrophy, most prominent in the cerebellum, stable since prior study. No acute intracranial abnormality. Specifically, no hemorrhage, hydrocephalus, mass lesion, acute infarction, or significant intracranial injury. No acute calvarial abnormality. Visualized paranasal sinuses and mastoids clear. Orbital soft tissues unremarkable.  IMPRESSION: Diffuse atrophy, most prominent within the cerebellum.  No acute intracranial abnormality.   Electronically Signed   By: Rolm Baptise M.D.   On: 09/05/2013 16:57    Assessment/Plan: 58 year old lady with HIV infection as well as history of head injury and seizure disorder presenting following an episode of loss of consciousness, most likely a syncopal episode. However, continued confusion beyond baseline is of unclear etiology. Partial seizure activity as a contributing factor to mental status exacerbation cannot be completely ruled out. There was no clear description of generalized convulsive activity. Clinical examination was unremarkable except for mental status changes. CT scan and routine laboratory studies were unremarkable.  Recommendations: 1. Loading dose of Keppra 1000 mg IV 2. Increase Keppra maintenance dose to 750 mg twice a day 3. Agree with obtaining MRI of the brain with and without contrast media, since etiology for worsening of mental status and possible recurrent seizure is unclear.  We will continue to follow this patient with you.  C.R. Nicole Kindred, MD [facility] [phone]  09/05/2013, 6:36 PM

## 2013-09-05 NOTE — Progress Notes (Signed)
Pt admitted to [facility]. Pt is A&Ox4 with short term memory loss. Pt lives at home with mother with home health aid. Pt's skin is warm, dry and intact. Pt has hard raised area under left breast near rib cage. Pt states no pain in the area. Oriented pt to room and unit. Pt placed on telemetry with continuous pulse ox through telemetry box. Pt placed on seizure precautions, hooked up suction and nasal cannula.  Will continue to monitor pt. Ranelle Oyster, RN

## 2013-09-06 DIAGNOSIS — F329 Major depressive disorder, single episode, unspecified: Secondary | ICD-10-CM

## 2013-09-06 DIAGNOSIS — F1020 Alcohol dependence, uncomplicated: Secondary | ICD-10-CM

## 2013-09-06 DIAGNOSIS — F3289 Other specified depressive episodes: Secondary | ICD-10-CM

## 2013-09-06 DIAGNOSIS — R55 Syncope and collapse: Secondary | ICD-10-CM

## 2013-09-06 DIAGNOSIS — Z8659 Personal history of other mental and behavioral disorders: Secondary | ICD-10-CM

## 2013-09-06 DIAGNOSIS — F1420 Cocaine dependence, uncomplicated: Secondary | ICD-10-CM

## 2013-09-06 LAB — AMMONIA: Ammonia: 28 umol/L (ref 11–60)

## 2013-09-06 LAB — CBC WITH DIFFERENTIAL/PLATELET
Basophils Absolute: 0 10*3/uL (ref 0.0–0.1)
Basophils Relative: 0 % (ref 0–1)
EOS ABS: 0.2 10*3/uL (ref 0.0–0.7)
EOS PCT: 4 % (ref 0–5)
HEMATOCRIT: 37.3 % (ref 36.0–46.0)
Hemoglobin: 12.5 g/dL (ref 12.0–15.0)
LYMPHS ABS: 2.1 10*3/uL (ref 0.7–4.0)
Lymphocytes Relative: 38 % (ref 12–46)
MCH: 28.1 pg (ref 26.0–34.0)
MCHC: 33.5 g/dL (ref 30.0–36.0)
MCV: 83.8 fL (ref 78.0–100.0)
MONO ABS: 0.4 10*3/uL (ref 0.1–1.0)
Monocytes Relative: 8 % (ref 3–12)
Neutro Abs: 2.8 10*3/uL (ref 1.7–7.7)
Neutrophils Relative %: 50 % (ref 43–77)
PLATELETS: 147 10*3/uL — AB (ref 150–400)
RBC: 4.45 MIL/uL (ref 3.87–5.11)
RDW: 13.6 % (ref 11.5–15.5)
WBC: 5.6 10*3/uL (ref 4.0–10.5)

## 2013-09-06 LAB — COMPREHENSIVE METABOLIC PANEL
ALBUMIN: 3.4 g/dL — AB (ref 3.5–5.2)
ALK PHOS: 58 U/L (ref 39–117)
ALT: 32 U/L (ref 0–35)
AST: 52 U/L — AB (ref 0–37)
BUN: 9 mg/dL (ref 6–23)
CO2: 26 mEq/L (ref 19–32)
Calcium: 8.7 mg/dL (ref 8.4–10.5)
Chloride: 105 mEq/L (ref 96–112)
Creatinine, Ser: 0.64 mg/dL (ref 0.50–1.10)
GFR calc Af Amer: >90 mL/min (ref >90)
GFR calc non Af Amer: >90 mL/min (ref >90)
Glucose, Bld: 88 mg/dL (ref 70–99)
Potassium: 4 mEq/L (ref 3.7–5.3)
Sodium: 142 mEq/L (ref 137–147)
TOTAL PROTEIN: 8.1 g/dL (ref 6.0–8.3)
Total Bilirubin: 0.2 mg/dL — ABNORMAL LOW (ref 0.3–1.2)

## 2013-09-06 LAB — TROPONIN I
Troponin I: <0.3 ng/mL (ref <0.30)
Troponin I: <0.3 ng/mL (ref <0.30)

## 2013-09-06 MED ORDER — LEVETIRACETAM 750 MG PO TABS
750.0000 mg | ORAL_TABLET | Freq: Two times a day (BID) | ORAL | Status: DC
  Administered 2013-09-06: 750 mg via ORAL
  Filled 2013-09-06 (×3): qty 1

## 2013-09-06 MED ORDER — LEVETIRACETAM 750 MG PO TABS: 750.0000 mg | ORAL_TABLET | Freq: Two times a day (BID) | ORAL | Status: DC

## 2013-09-06 MED ORDER — CIPROFLOXACIN HCL 500 MG PO TABS: 500.0000 mg | ORAL_TABLET | Freq: Two times a day (BID) | ORAL | Status: DC

## 2013-09-06 MED ORDER — DEXTROSE 5 % IV SOLN
1.0000 g | INTRAVENOUS | Status: DC
  Filled 2013-09-06: qty 10

## 2013-09-06 NOTE — Discharge Summary (Signed)
Addendum  Patient seen and examined, chart and data base reviewed.  I agree with the above assessment and plan.  For full details please see Mrs. Imogene Burn PA note.  Altered mental status, back to normal and a.m.  UTI was treated recently, but still has evidence of pyuria, discharge and ciprofloxacin to complete 5 days.   Birdie Hopes, MD [facility] Pager: [phone] 09/06/2013, 5:13 PM

## 2013-09-06 NOTE — Care Management Note (Signed)
    Page 1 of 1   09/06/2013     2:11:29 PM CARE MANAGEMENT NOTE 09/06/2013  Patient:  [name] [name], [name] [name]   Account Number:  [id number]  Date Initiated:  09/06/2013  Documentation initiated by:  Tomi Bamberger  Subjective/Objective Assessment:   dx poss syncopal/fall, o42  admit as observation. Lives with mom.     Action/Plan:   Anticipated DC Date:  09/06/2013   Anticipated DC Plan:  HOME/SELF CARE      DC Planning Services  CM consult      Choice offered to / List presented to:             Status of service:  Completed, signed off Medicare Important Message given?   (If response is "NO", the following Medicare IM given date fields will be blank) Date Medicare IM given:   Date Additional Medicare IM given:    Discharge Disposition:  HOME/SELF CARE  Per UR Regulation:  Reviewed for med. necessity/level of care/duration of stay  If discussed at [place] of Stay Meetings, dates discussed:    Comments:

## 2013-09-06 NOTE — Progress Notes (Signed)
  Echocardiogram 2D Echocardiogram has been performed.  [name] [name] 09/06/2013, 11:11 AM

## 2013-09-06 NOTE — Discharge Instructions (Signed)
Continue increased dose of Keppra (750 mg twice a day).  Follow up with your Neurologist in 2 weeks.  You will be on Cipro (antibiotic) for recurrent urinary tract infection for another 4 days.

## 2013-09-06 NOTE — Discharge Summary (Signed)
Physician Discharge Summary  ROBERTINE KIPPER ZOX:[id number] DOB: November 15, 1955 DOA: 09/05/2013  PCP: Scharlene Gloss, MD Neruologist:  Dr. Leta Baptist  Admit date: 09/05/2013 Discharge date: 09/06/2013  Time spent: 60 minutes  Recommendations for Outpatient Follow-up:  1. Increased keppra to 750 bid,  PCP in one week to monitor for behavioral changes, SI, depression  Discharge Diagnoses:  Active Problems:   Seizure disorder   Loss of consciousness  Discharge Condition: stable  Diet recommendation: low sodium heart healthy  Filed Weights   09/05/13 1521 09/05/13 2038 09/06/13 0500  Weight: 48.081 kg (106 lb) 46.857 kg (103 lb 4.8 oz) 46.857 kg (103 lb 4.8 oz)    History of present illness:  Mrs. [name] [name] is a 58 year old female with a PMH of HIV- well controlled.  Viral load undetectable and CD5 within normal limits in April 2015, seizure disorder, substance abuse.   She presented to the Emergency room with encephalopathy following loss of consciousness.   History was provided primarily from home health aid because the patient was mildly confused.  The aid states the she had gone for her routine mammogram when the patient became unconscious with stiffening of the  right extremity - flexed, no convulsions noted. It was reported that she was unconscious for about 5 minutes.  Upon wakening she was confused.  She takes Keppra 500mg  bid at home and has been compliant with this medication.  In the emergency room CT of the head was negative for any acute findings but revealed diffuse atrophy, afebrile, no abnormalities noted on routine labs.   Hospital Course:   Seizure disorder  Patient has a history of seizures and takes Keppra 500mg  bid at home  Neuro was consulted and gave keppra - a 1000 mg bolus and 750 mg PO bid on-going  MRI and CT of the brain showed no acute abnormalities,   Patient appears at mental status baseline on 4/30.    Loss of consciousness  Most likely due to  seizure  UTI  Patient reports recently being treated for UTI w/in the past week  UA on admission showed WBC and leukocyte esterase  Started on rocephin -inpatient (received 1 dose)  Discharged with Cipro 500 mg bid 4 day course  HIV  Patient has a history of HIV controled with atripla   CD4 within normal limits and viral load undetectable as of April 2015   Procedures:  none  Consultations:  Neurology - Wallie Char  Discharge Exam: Filed Vitals:   09/06/13 0500  BP: 104/72  Pulse: 81  Temp:   Resp: 16    Physical Exam  Constitutional: She is oriented to person, place, and time. She appears well-developed and well-nourished. No distress.  HENT:   Head: Normocephalic and atraumatic.   Eyes: Pupils are equal, round, and reactive to light.  Neck: No JVD present.  Cardiovascular: Normal rate, regular rhythm and normal heart sounds.   Respiratory: Effort normal and breath sounds normal. No stridor. No respiratory distress. She has no wheezes. She exhibits no tenderness.  GI: Soft. Bowel sounds are normal. She exhibits no distension. There is no tenderness. There is no rebound.  Musculoskeletal: Normal range of motion. She exhibits no tenderness.  Neurological: She is alert and oriented to person, place, and time.  Skin: Skin is warm and dry. She is not diaphoretic. No erythema.  Psychiatric: She has a normal mood and affect. Her speech is normal and behavior is normal. Judgment and thought content normal. Cognition and memory are  normal.       Discharge Orders   Future Appointments Provider Department Dept Phone   12/11/2013 3:00 PM [facility] for Infectious Disease [phone]   12/27/2013 11:15 AM Thayer Headings, MD [facility] for Infectious Disease [phone]   09/05/2014 2:00 PM Penni Bombard, MD [facility] [phone]   Future Orders Complete By Expires   Diet - low sodium heart healthy   As directed    Increase activity slowly  As directed        Medication List         ciprofloxacin 500 MG tablet  Commonly known as:  CIPRO  Take 1 tablet (500 mg total) by mouth 2 (two) times daily.     efavirenz-emtricitabine-tenofovir 600-200-300 MG per tablet  Commonly known as:  ATRIPLA  Take 1 tablet by mouth at bedtime.     levETIRAcetam 750 MG tablet  Commonly known as:  KEPPRA  Take 1 tablet (750 mg total) by mouth 2 (two) times daily.     multivitamin tablet  Take 1 tablet by mouth daily.     sertraline 50 MG tablet  Commonly known as:  ZOLOFT  Take 3 tablets (150 mg total) by mouth daily.       No Known Allergies Follow-up Information   Follow up with Scharlene Gloss, MD In 1 week.   Specialty:  Infectious Diseases   Contact information:   [address] [address] [phone]       Follow up with Andrey Spearman, MD In 2 weeks.   Specialties:  Neurology, Radiology   Contact information:   [address] [place] [address] [phone]        The results of significant diagnostics from this hospitalization (including imaging, microbiology, ancillary and laboratory) are listed below for reference.    Significant Diagnostic Studies: Dg Chest 2 View  09/05/2013   CLINICAL DATA:  Seizure  EXAM: CHEST  2 VIEW  COMPARISON:  10/24/2003  FINDINGS: The heart size and mediastinal contours are within normal limits. Both lungs are clear. The visualized skeletal structures are unremarkable.  IMPRESSION: No active cardiopulmonary disease.   Electronically Signed   By: Rolm Baptise M.D.   On: 09/05/2013 17:00   Ct Head Wo Contrast  09/05/2013   CLINICAL DATA:  Syncope, confusion.  EXAM: CT HEAD WITHOUT CONTRAST  TECHNIQUE: Contiguous axial images were obtained from the base of the skull through the vertex without intravenous contrast.  COMPARISON:  CT 12/26/2008.  MRI 10/16/2009.  FINDINGS: Global atrophy, most prominent in the  cerebellum, stable since prior study. No acute intracranial abnormality. Specifically, no hemorrhage, hydrocephalus, mass lesion, acute infarction, or significant intracranial injury. No acute calvarial abnormality. Visualized paranasal sinuses and mastoids clear. Orbital soft tissues unremarkable.  IMPRESSION: Diffuse atrophy, most prominent within the cerebellum.  No acute intracranial abnormality.   Electronically Signed   By: Rolm Baptise M.D.   On: 09/05/2013 16:57   Mr Brain Wo Contrast  09/05/2013   CLINICAL DATA:  Seizure.  Confusion.  Syncope.  HIV infection.  EXAM: MRI HEAD WITHOUT CONTRAST  TECHNIQUE: Multiplanar, multiecho pulse sequences of the brain and surrounding structures were obtained without intravenous contrast.  COMPARISON:  Head CT 09/05/2013.  MRI 10/16/2009.  FINDINGS: Diffusion imaging does not show any acute or subacute infarction. The brainstem is unremarkable. There is cerebellar atrophy. Cerebral hemispheres show mild atrophy without focal insult. Mesial temporal lobes appear  normal. No pituitary mass. No inflammatory sinus disease.  IMPRESSION: Brain atrophy. No focal or acute finding. No cause of seizure is identified.   Electronically Signed   By: Nelson Chimes M.D.   On: 09/05/2013 19:37   Labs: Basic Metabolic Panel:  Recent Labs Lab 09/05/13 1625 09/05/13 2000 09/06/13 0111  NA 140  --  142  K 4.2  --  4.0  CL 102  --  105  CO2 24  --  26  GLUCOSE 88  --  88  BUN 12  --  9  CREATININE 0.63 0.59 0.64  CALCIUM 9.2  --  8.7   Liver Function Tests:  Recent Labs Lab 09/05/13 1625 09/06/13 0111  AST 62* 52*  ALT 38* 32  ALKPHOS 66 58  BILITOT <0.2* 0.2*  PROT 8.7* 8.1  ALBUMIN 3.7 3.4*    Recent Labs Lab 09/06/13 0202  AMMONIA 28   CBC:  Recent Labs Lab 09/05/13 1625 09/05/13 2000 09/06/13 0111  WBC 5.5 6.4 5.6  NEUTROABS 3.3  --  2.8  HGB 13.6 13.5 12.5  HCT 40.3 39.9 37.3  MCV 84.1 83.5 83.8  PLT 148* 166 147*   Cardiac  Enzymes:  Recent Labs Lab 09/05/13 1625 09/05/13 2000 09/06/13 0111 09/06/13 0637  CKTOTAL 234*  --   --   --   TROPONINI <0.30 <0.30 <0.30 <0.30    Signed:  Azzie Glatter, [place], [place] [phone]  [facility] 09/06/2013, 11:26 AM

## 2013-09-06 NOTE — Progress Notes (Signed)
Rosaria Ferries to be D/C'd Home per MD order.  Discussed with the patient and all questions fully answered.    Medication List         ciprofloxacin 500 MG tablet  Commonly known as:  CIPRO  Take 1 tablet (500 mg total) by mouth 2 (two) times daily.     efavirenz-emtricitabine-tenofovir 600-200-300 MG per tablet  Commonly known as:  ATRIPLA  Take 1 tablet by mouth at bedtime.     levETIRAcetam 750 MG tablet  Commonly known as:  KEPPRA  Take 1 tablet (750 mg total) by mouth 2 (two) times daily.     multivitamin tablet  Take 1 tablet by mouth daily.     sertraline 50 MG tablet  Commonly known as:  ZOLOFT  Take 3 tablets (150 mg total) by mouth daily.        VVS, Skin clean, dry and intact without evidence of skin break down, no evidence of skin tears noted. IV catheter discontinued intact. Site without signs and symptoms of complications. Dressing and pressure applied.  An After Visit Summary was printed and given to the patient.  D/c education completed with patient/family including follow up instructions, medication list, d/c activities limitations if indicated, with other d/c instructions as indicated by MD - patient able to verbalize understanding, all questions fully answered.   Patient instructed to return to ED, call 911, or call MD for any changes in condition.   Patient escorted via home health aide and d/c Home  Delman Cheadle 09/06/2013 3:35 PM

## 2013-09-08 LAB — URINE CULTURE: Colony Count: >=100000

## 2013-09-17 ENCOUNTER — Telehealth: Payer: Self-pay | Admitting: Diagnostic Neuroimaging

## 2013-09-17 NOTE — Telephone Encounter (Signed)
Patient's caregiver calling requesting a 2-week follow-up following hospitalization for seizures. Please call to advise.

## 2013-09-19 NOTE — Telephone Encounter (Signed)
Pt calling stating that per ED discharge summary pt was instructed to set up f/u appt with Dr. Leta Baptist in 2 weeks. Pt was seen in the ED on 09/06/13, last OV 08/30/13 and next OV is 09/05/14. Please advise

## 2013-09-27 ENCOUNTER — Other Ambulatory Visit: Payer: Self-pay | Admitting: Internal Medicine

## 2013-10-03 ENCOUNTER — Other Ambulatory Visit: Payer: Self-pay | Admitting: Diagnostic Neuroimaging

## 2013-10-03 NOTE — Telephone Encounter (Signed)
Patient's mother Mrs. Tetterford still waiting on return call from 5/13.  Please call and advise.  [phone].

## 2013-10-03 NOTE — Telephone Encounter (Signed)
I called and made appt for pt on 10-19-13 at 1130 be here 1115 for appt with NP Lam. (seizure  09-06-13 seen in ED).  Increased her keppra to 750mg  po bid.  No seizures since.  Confirmed appt.

## 2013-10-08 ENCOUNTER — Encounter: Payer: Self-pay | Admitting: Internal Medicine

## 2013-10-08 ENCOUNTER — Ambulatory Visit (INDEPENDENT_AMBULATORY_CARE_PROVIDER_SITE_OTHER): Payer: Medicaid Other | Admitting: Internal Medicine

## 2013-10-08 VITALS — BP 105/71 | HR 87 | Temp 98.2°F | Ht 59.0 in | Wt 106.0 lb

## 2013-10-08 DIAGNOSIS — B20 Human immunodeficiency virus [HIV] disease: Secondary | ICD-10-CM

## 2013-10-08 DIAGNOSIS — R569 Unspecified convulsions: Secondary | ICD-10-CM

## 2013-10-08 NOTE — Assessment & Plan Note (Signed)
No issues with Keppra.  Follow up with neurology.  Encouraged patient to get PCP.

## 2013-10-08 NOTE — Progress Notes (Signed)
   Subjective:    Patient ID: [name] [name], female    DOB: 11-04-1955, 58 y.o.   MRN: [id number]  HPI Here for a work in visit.  Had a recent seizure.  Has had 4 this year.  Recently saw neurology but seizure was after. Hospitalized and now increased Keppra to 750 mg bid.  No problems since leaving the hospital.  No behaviour changes, no depression.     Review of Systems  Constitutional: Negative for fatigue and unexpected weight change.  Eyes: Positive for visual disturbance.       Some blurriness right eye  Neurological: Negative for dizziness, light-headedness and headaches.       Objective:   Physical Exam  Constitutional: She is oriented to person, place, and time. She appears well-developed and well-nourished. No distress.  Cardiovascular: Normal rate, regular rhythm and normal heart sounds.   No murmur heard. Pulmonary/Chest: Effort normal and breath sounds normal. No respiratory distress. She has no wheezes.  Neurological: She is alert and oriented to person, place, and time. No cranial nerve deficit.  Skin: No rash noted.          Assessment & Plan:

## 2013-10-10 ENCOUNTER — Telehealth: Payer: Self-pay | Admitting: *Deleted

## 2013-10-10 DIAGNOSIS — B20 Human immunodeficiency virus [HIV] disease: Secondary | ICD-10-CM

## 2013-10-10 NOTE — Telephone Encounter (Addendum)
Thursday 6/11 10:30 (arrive early) Dr. Wynetta Emery at [facility].  [address] [place], [place] [address] [phone].  Pt's PCA notified, she will adjust the appointment to fit their needs. Referred to [facility] for PCP services.  Landis Gandy, RN

## 2013-10-11 MED ORDER — EFAVIRENZ-EMTRICITAB-TENOFOVIR 600-200-300 MG PO TABS: ORAL_TABLET | ORAL | Status: DC

## 2013-10-19 ENCOUNTER — Telehealth: Payer: Self-pay | Admitting: Nurse Practitioner

## 2013-10-19 NOTE — Telephone Encounter (Signed)
Patient came to the office today at 11:30  for an appointment with Charlott Holler but the appointment was not entered into the system (see telephone notes by Mickel Fuchs). Patient was rescheduled for 6/16 with Jeani Hawking.

## 2013-10-19 NOTE — Telephone Encounter (Signed)
noted 

## 2013-10-23 ENCOUNTER — Ambulatory Visit: Payer: Medicaid Other | Admitting: Nurse Practitioner

## 2013-10-24 ENCOUNTER — Encounter (HOSPITAL_COMMUNITY): Payer: Self-pay | Admitting: Emergency Medicine

## 2013-10-24 ENCOUNTER — Emergency Department (HOSPITAL_COMMUNITY)
Admission: EM | Admit: 2013-10-24 | Discharge: 2013-10-24 | Disposition: A | Discharge status: Home / Self Care | Payer: Medicaid Other | Attending: Emergency Medicine | Admitting: Emergency Medicine

## 2013-10-24 DIAGNOSIS — F329 Major depressive disorder, single episode, unspecified: Secondary | ICD-10-CM | POA: Insufficient documentation

## 2013-10-24 DIAGNOSIS — R569 Unspecified convulsions: Secondary | ICD-10-CM

## 2013-10-24 DIAGNOSIS — Z87828 Personal history of other (healed) physical injury and trauma: Secondary | ICD-10-CM | POA: Insufficient documentation

## 2013-10-24 DIAGNOSIS — F0390 Unspecified dementia without behavioral disturbance: Secondary | ICD-10-CM | POA: Insufficient documentation

## 2013-10-24 DIAGNOSIS — Z21 Asymptomatic human immunodeficiency virus [HIV] infection status: Secondary | ICD-10-CM | POA: Insufficient documentation

## 2013-10-24 DIAGNOSIS — Z79899 Other long term (current) drug therapy: Secondary | ICD-10-CM | POA: Insufficient documentation

## 2013-10-24 DIAGNOSIS — F3289 Other specified depressive episodes: Secondary | ICD-10-CM | POA: Insufficient documentation

## 2013-10-24 DIAGNOSIS — Z87891 Personal history of nicotine dependence: Secondary | ICD-10-CM | POA: Insufficient documentation

## 2013-10-24 DIAGNOSIS — G40909 Epilepsy, unspecified, not intractable, without status epilepticus: Secondary | ICD-10-CM

## 2013-10-24 LAB — URINALYSIS, ROUTINE W REFLEX MICROSCOPIC
Bilirubin Urine: NEGATIVE
Glucose, UA: NEGATIVE mg/dL
HGB URINE DIPSTICK: NEGATIVE
Ketones, ur: NEGATIVE mg/dL
NITRITE: NEGATIVE
Protein, ur: NEGATIVE mg/dL
SPECIFIC GRAVITY, URINE: 1.023 (ref 1.005–1.030)
Urobilinogen, UA: 1 mg/dL (ref 0.0–1.0)
pH: 6 (ref 5.0–8.0)

## 2013-10-24 LAB — CBC WITH DIFFERENTIAL/PLATELET
BASOS ABS: 0 10*3/uL (ref 0.0–0.1)
BASOS PCT: 0 % (ref 0–1)
EOS ABS: 0.1 10*3/uL (ref 0.0–0.7)
EOS PCT: 3 % (ref 0–5)
HEMATOCRIT: 38 % (ref 36.0–46.0)
Hemoglobin: 12.5 g/dL (ref 12.0–15.0)
Lymphocytes Relative: 34 % (ref 12–46)
Lymphs Abs: 1.8 10*3/uL (ref 0.7–4.0)
MCH: 27.5 pg (ref 26.0–34.0)
MCHC: 32.9 g/dL (ref 30.0–36.0)
MCV: 83.5 fL (ref 78.0–100.0)
MONO ABS: 0.3 10*3/uL (ref 0.1–1.0)
Monocytes Relative: 6 % (ref 3–12)
Neutro Abs: 3 10*3/uL (ref 1.7–7.7)
Neutrophils Relative %: 57 % (ref 43–77)
PLATELETS: 120 10*3/uL — AB (ref 150–400)
RBC: 4.55 MIL/uL (ref 3.87–5.11)
RDW: 13.6 % (ref 11.5–15.5)
WBC: 5.2 10*3/uL (ref 4.0–10.5)

## 2013-10-24 LAB — COMPREHENSIVE METABOLIC PANEL
ALT: 33 U/L (ref 0–35)
AST: 56 U/L — AB (ref 0–37)
Albumin: 3.8 g/dL (ref 3.5–5.2)
Alkaline Phosphatase: 58 U/L (ref 39–117)
BUN: 9 mg/dL (ref 6–23)
CALCIUM: 9.1 mg/dL (ref 8.4–10.5)
CO2: 24 mEq/L (ref 19–32)
Chloride: 106 mEq/L (ref 96–112)
Creatinine, Ser: 0.72 mg/dL (ref 0.50–1.10)
GFR calc Af Amer: >90 mL/min (ref >90)
Glucose, Bld: 91 mg/dL (ref 70–99)
Potassium: 4.2 mEq/L (ref 3.7–5.3)
SODIUM: 142 meq/L (ref 137–147)
TOTAL PROTEIN: 8.4 g/dL — AB (ref 6.0–8.3)
Total Bilirubin: 0.2 mg/dL — ABNORMAL LOW (ref 0.3–1.2)

## 2013-10-24 LAB — TROPONIN I: Troponin I: <0.3 ng/mL (ref <0.30)

## 2013-10-24 LAB — URINE MICROSCOPIC-ADD ON

## 2013-10-24 MED ORDER — LEVETIRACETAM 750 MG PO TABS
750.0000 mg | ORAL_TABLET | Freq: Once | ORAL | Status: AC
  Administered 2013-10-24: 750 mg via ORAL
  Filled 2013-10-24: qty 1

## 2013-10-24 NOTE — ED Notes (Signed)
PER EMS - pt from eye doctor's office with c/o witnessed sz, described as caregiver as "eyes rolled back in her head", but caregiver was able to assist ambulating pt to a chair.  No post-ictal period noted.  Pt denies complaints.  Pt refused IV angio.  Awake, alert, at baseline 2/2 hx dementia. No seizure activity noted per EMS.

## 2013-10-24 NOTE — ED Provider Notes (Signed)
CSN: [id number]     Arrival date & time 10/24/13  1445 History   First MD Initiated Contact with Patient 10/24/13 1452     Chief Complaint  Patient presents with  . Seizures   Level V caveat for dementia  (Consider location/radiation/quality/duration/timing/severity/associated sxs/prior Treatment) HPI patient presents from her ophthalmologist office after having an event per EMS were "patient is rolled back in her head". Patient states she remembers going to the ophthalmology office. She does not remember if anything happened. She states she feels fine now. She initially stated she did not take her medicine today and she states she did take her medicine today.  ID Dr Linus Salmons First PCP appt with "Dr Sharyn Lull", 6/30 at  [facility] Dr Leta Baptist  Past Medical History  Diagnosis Date  . HIV infection   . Substance abuse   . H/O head injury   . Seizures   . Dementia   . Depression    Past Surgical History  Procedure Laterality Date  . Unremarkable     Family History  Problem Relation Age of Onset  . Colon cancer Neg Hx   . Diabetes Mother   . Behavior problems Mother   . Headache Mother    History  Substance Use Topics  . Smoking status: Former Research scientist (life sciences)  . Smokeless tobacco: Never Used  . Alcohol Use: No  lives with her mother   OB History   Grav Para Term Preterm Abortions TAB SAB Ect Mult Living                 Review of Systems  All other systems reviewed and are negative.     Allergies  Review of patient's allergies indicates no known allergies.  Home Medications   Prior to Admission medications   Medication Sig Start Date End Date Taking? Authorizing Provider  efavirenz-emtricitabine-tenofovir (ATRIPLA) [phone] MG per tablet Take 1 tablet by mouth at bedtime.   Yes Historical Provider, MD  levETIRAcetam (KEPPRA) 750 MG tablet Take 1 tablet (750 mg total) by mouth 2 (two) times daily. 09/06/13  Yes Marianne L York, PA-C  Multiple Vitamin  (MULTIVITAMIN) tablet Take 1 tablet by mouth daily.     Yes Historical Provider, MD  sertraline (ZOLOFT) 50 MG tablet Take 150 mg by mouth daily.   Yes Historical Provider, MD   BP 111/72  Pulse 72  Temp(Src) 98.7 F (37.1 C) (Oral)  Resp 19  SpO2 96%  Vital signs normal   Physical Exam  Nursing note and vitals reviewed. Constitutional: She appears well-developed and well-nourished.  Non-toxic appearance. She does not appear ill. No distress.  HENT:  Head: Normocephalic and atraumatic.  Right Ear: External ear normal.  Left Ear: External ear normal.  Nose: Nose normal. No mucosal edema or rhinorrhea.  Mouth/Throat: Oropharynx is clear and moist and mucous membranes are normal. No dental abscesses or uvula swelling.  Torus mandibularis  Eyes: Conjunctivae and EOM are normal. Pupils are equal, round, and reactive to light.  Neck: Normal range of motion and full passive range of motion without pain. Neck supple.  Cardiovascular: Normal rate, regular rhythm and normal heart sounds.  Exam reveals no gallop and no friction rub.   No murmur heard. Pulmonary/Chest: Effort normal and breath sounds normal. No respiratory distress. She has no wheezes. She has no rhonchi. She has no rales. She exhibits no tenderness and no crepitus.  Abdominal: Soft. Normal appearance and bowel sounds are normal. She exhibits no distension. There is  no tenderness. There is no rebound and no guarding.  Musculoskeletal: Normal range of motion. She exhibits no edema and no tenderness.  Moves all extremities well.   Neurological: She is alert. She has normal strength. No cranial nerve deficit.  Pt has some confusion  Skin: Skin is warm, dry and intact. No rash noted. No erythema. No pallor.  Psychiatric: She has a normal mood and affect. Her speech is normal and behavior is normal. Her mood appears not anxious.    ED Course  Procedures (including critical care time)   Medications  levETIRAcetam (KEPPRA)  tablet 750 mg (not administered)  15:45 patient's caregiver who has been taking care of patient since September arrived in ED. She states they were at the ophthalmology office checking in and she noted that the patient's arms bent at the elbow and got stiff, her eyes rolled back and she started to fall backwards. The caregiver and somebody else grabbed the patient and helped her down to the floor. She did not have any jerking. Her eyes remained open and her arms remained stiff and bent. This lasted less than 2 minutes. She then was postictal for 10-15 minutes. There was no incontinence. Her caregiver states she seems back to her baseline now. The caregiver called the patient's mother who states she found the patient's medicine in her bedroom this morning. She is not sure whether she took her medicine last night or even this morning. Caregiver states the last time she saw a seizure was when they were at radiology to get a mammogram and she had similar symptoms as today however she did have jerking at that visit on April 30.  Patient had no further seizure activity while in the ED. She was given a dose of her Keppra while in the ED.   Labs Review   Imaging Review No results found.   EKG Interpretation None       Date: 10/24/2013  Rate: 70  Rhythm: normal sinus rhythm  QRS Axis: normal  Intervals: normal  ST/T Wave abnormalities: normal  Conduction Disutrbances:none  Narrative Interpretation:   Old EKG Reviewed: none available     MDM   Final diagnoses:  Seizure  Seizure disorder    Plan discharge  Rolland Porter, MD, Alanson Aly, MD 10/24/13 [phone]

## 2013-10-24 NOTE — ED Notes (Addendum)
Spoke with mother on phone. Mother wants pt to sign out AMA. Pt wants to leave, but has hx of dementia. MD informed

## 2013-10-24 NOTE — ED Notes (Signed)
Called for cab again

## 2013-10-24 NOTE — Discharge Instructions (Signed)
Take your medications every day. I sent a keppra level today, you can have Dr [facility] office check that test result in the next several days.

## 2013-10-24 NOTE — ED Notes (Signed)
Spoke with mother/caregiver. Mother states she will be at home for cab and will pay driver.

## 2013-10-24 NOTE — ED Notes (Signed)
Bed: WA03 Expected date:  Expected time:  Means of arrival:  Comments: EMS-seizure 

## 2013-10-27 LAB — LEVETIRACETAM LEVEL: LEVETIRACETAM: 28.9 ug/mL

## 2013-11-06 ENCOUNTER — Ambulatory Visit (INDEPENDENT_AMBULATORY_CARE_PROVIDER_SITE_OTHER): Payer: Medicaid Other | Admitting: Family Medicine

## 2013-11-06 ENCOUNTER — Encounter: Payer: Self-pay | Admitting: Family Medicine

## 2013-11-06 VITALS — BP 108/69 | HR 92 | Temp 98.4°F | Resp 20 | Ht 60.0 in | Wt 104.0 lb

## 2013-11-06 DIAGNOSIS — Z8619 Personal history of other infectious and parasitic diseases: Secondary | ICD-10-CM | POA: Diagnosis not present

## 2013-11-06 DIAGNOSIS — H538 Other visual disturbances: Secondary | ICD-10-CM | POA: Diagnosis not present

## 2013-11-06 DIAGNOSIS — B20 Human immunodeficiency virus [HIV] disease: Secondary | ICD-10-CM

## 2013-11-06 DIAGNOSIS — G40909 Epilepsy, unspecified, not intractable, without status epilepticus: Secondary | ICD-10-CM | POA: Diagnosis not present

## 2013-11-06 DIAGNOSIS — R636 Underweight: Secondary | ICD-10-CM

## 2013-11-06 NOTE — Progress Notes (Signed)
Subjective:    Patient ID: [name] [name], female    DOB: November 15, 1955, 58 y.o.   MRN: [id number]  HPI [name] [name] is in office accompanied by caregiver to establish care. Patient reports a history of HIV, patient is followed by Dr. Linus Salmons at [facility].   Patient has been on Atripla and has been taking medications consistently.well-controlled with Currently has an undetectable viral load and CD4 count within normal limits as of April 2015. Patient is scheduled to have repeat labs in August. Her home health care giver and her mother assist with medications management. Patient currently resides with her mother. Patient has a history of drug dependency. However, she currently denies IV/illicit drug use.   She is complaining of blurry vision to right eye. Reports that she was referred to the ophthalmologist by Dr. Linus Salmons earlier this month. The caregiver states that she had a seizure while in the waiting room and was transported to the hospital for a full workup. Reports that patient has been taking medication consistently and has not experienced seizure activity since 10/24/2013. She currently denies headache, dizziness, gait abnormality, tremors, or weakness.   Patient has a history of seizure disorder. Caregiver reports that patient rarely has seizure activity. She takes Keppra consistently and is followed by [facility]. Seizure disorder is stable and patient denies any recent drug use.      Review of Systems  Constitutional: Positive for unexpected weight change (patient had recent weight loss but is now stable at 104). Negative for fatigue.  HENT: Negative.   Eyes: Negative.   Respiratory: Negative.   Cardiovascular: Negative.   Gastrointestinal: Negative.   Endocrine: Negative.   Genitourinary: Negative.  Negative for dysuria, difficulty urinating and dyspareunia.  Musculoskeletal: Negative.   Skin: Negative.   Allergic/Immunologic: Negative.   Neurological:  Positive for seizures (Last seizure on 10/24/2013). Negative for dizziness, tremors, weakness and headaches.  Hematological: Negative.   Psychiatric/Behavioral: Negative.        Patient denies depression and suicidal or homicidal ideations.        Objective:   Physical Exam  Constitutional: She is oriented to person, place, and time. She appears well-developed and well-nourished.  HENT:  Head: Normocephalic and atraumatic.  Right Ear: External ear normal.  Eyes: Conjunctivae are normal. Pupils are equal, round, and reactive to light.  Neck: Normal range of motion. Neck supple.  Cardiovascular: Normal rate, regular rhythm, normal heart sounds and intact distal pulses.   Pulmonary/Chest: Effort normal and breath sounds normal.  Abdominal: Soft. Bowel sounds are normal.  Musculoskeletal: Normal range of motion.  Neurological: She is alert and oriented to person, place, and time. She has normal reflexes.  Skin: Skin is warm and dry.  Psychiatric: She has a normal mood and affect. Her behavior is normal. Judgment and thought content normal.     BP 108/69  Pulse 92  Temp(Src) 98.4 F (36.9 C) (Oral)  Resp 20  Ht 5' (1.524 m)  Wt 104 lb (47.174 kg)  BMI 20.31 kg/m2     Assessment & Plan:  1. HIV: Patient has been on Atripla and has been taking medications consistently.well-controlled with Currently has an undetectable viral load and CD4 count within normal limits as of April 2015. Patient is scheduled to have repeat labs in August. Her home health care giver and her mother assist with medications management. Patient currently resides with her mother. Patient has a history of drug dependency. However, she currently denies IV/illicit drug use.  Patient is currently being followed by Dr. Linus Salmons. Follow-up appointment is in August per caregiver.   2. Seizure: Patient was hospitalized for seizure activity on 10/24/2013. She is currently stable on Keppra 500 mg daily. She is followed by  [facility]. Caregiver states that patient has had some memory loss.   3. Blurry Vision: Patient was referred to Opthalmology for blurriness to right eye. Patient had a seizure in the waiting area. Follow up appointment is on 11/22/2013  4. Weight loss: Care given states that the patient was previously on ensure for weight loss. She reports that her appetite has increased. Patient is currently 104 pounds, which is decreased from previous results.   5. History of Hep B: Stable  Preventative care:   Immunizations: Current Colonoscopy: Current Last pap smear: 08/23/2013   RTC: 3 months for CPE with Dr. Zigmund Daniel

## 2013-11-07 ENCOUNTER — Telehealth: Payer: Self-pay | Admitting: Diagnostic Neuroimaging

## 2013-11-07 MED ORDER — SERTRALINE HCL 50 MG PO TABS: 150.0000 mg | ORAL_TABLET | Freq: Every day | ORAL | Status: DC

## 2013-11-07 NOTE — Telephone Encounter (Signed)
Rx has been sent  

## 2013-11-07 NOTE — Telephone Encounter (Signed)
[name] [name] with [facility] @ [phone], requesting Rx refill for sertraline (ZOLOFT) 50 MG tablet.  Please call and advise.

## 2013-11-13 ENCOUNTER — Other Ambulatory Visit: Payer: Self-pay | Admitting: *Deleted

## 2013-11-13 DIAGNOSIS — B20 Human immunodeficiency virus [HIV] disease: Secondary | ICD-10-CM

## 2013-11-13 MED ORDER — EFAVIRENZ-EMTRICITAB-TENOFOVIR 600-200-300 MG PO TABS: 1.0000 | ORAL_TABLET | Freq: Every day | ORAL | Status: DC

## 2013-12-11 ENCOUNTER — Other Ambulatory Visit: Payer: Medicaid Other

## 2013-12-27 ENCOUNTER — Ambulatory Visit: Payer: Medicaid Other | Admitting: Internal Medicine

## 2014-01-16 ENCOUNTER — Ambulatory Visit: Payer: Medicaid Other | Admitting: Internal Medicine

## 2014-02-05 ENCOUNTER — Other Ambulatory Visit (HOSPITAL_COMMUNITY)
Admission: RE | Admit: 2014-02-05 | Discharge: 2014-02-05 | Disposition: A | Discharge status: Home / Self Care | Payer: Medicaid Other | Source: Ambulatory Visit | Attending: Internal Medicine | Admitting: Internal Medicine

## 2014-02-05 ENCOUNTER — Ambulatory Visit (INDEPENDENT_AMBULATORY_CARE_PROVIDER_SITE_OTHER): Payer: Medicaid Other | Admitting: Internal Medicine

## 2014-02-05 ENCOUNTER — Encounter: Payer: Self-pay | Admitting: Internal Medicine

## 2014-02-05 VITALS — BP 113/77 | HR 84 | Temp 98.0°F | Wt 100.0 lb

## 2014-02-05 DIAGNOSIS — B20 Human immunodeficiency virus [HIV] disease: Secondary | ICD-10-CM

## 2014-02-05 DIAGNOSIS — Z113 Encounter for screening for infections with a predominantly sexual mode of transmission: Secondary | ICD-10-CM | POA: Insufficient documentation

## 2014-02-05 DIAGNOSIS — Z23 Encounter for immunization: Secondary | ICD-10-CM

## 2014-02-05 DIAGNOSIS — Z79899 Other long term (current) drug therapy: Secondary | ICD-10-CM | POA: Insufficient documentation

## 2014-02-05 LAB — CBC WITH DIFFERENTIAL/PLATELET
Basophils Absolute: 0 10*3/uL (ref 0.0–0.1)
Basophils Relative: 1 % (ref 0–1)
EOS ABS: 0.1 10*3/uL (ref 0.0–0.7)
Eosinophils Relative: 3 % (ref 0–5)
HCT: 37.2 % (ref 36.0–46.0)
Hemoglobin: 12.8 g/dL (ref 12.0–15.0)
Lymphocytes Relative: 43 % (ref 12–46)
Lymphs Abs: 2.1 10*3/uL (ref 0.7–4.0)
MCH: 26.9 pg (ref 26.0–34.0)
MCHC: 34.4 g/dL (ref 30.0–36.0)
MCV: 78.3 fL (ref 78.0–100.0)
Monocytes Absolute: 0.3 10*3/uL (ref 0.1–1.0)
Monocytes Relative: 7 % (ref 3–12)
NEUTROS ABS: 2.3 10*3/uL (ref 1.7–7.7)
Neutrophils Relative %: 46 % (ref 43–77)
PLATELETS: 158 10*3/uL (ref 150–400)
RBC: 4.75 MIL/uL (ref 3.87–5.11)
RDW: 14.9 % (ref 11.5–15.5)
WBC: 4.9 10*3/uL (ref 4.0–10.5)

## 2014-02-05 NOTE — Assessment & Plan Note (Signed)
Labs today and rtc 4-5 months if ok.

## 2014-02-05 NOTE — Progress Notes (Signed)
  Subjective:    Patient ID: [name] [name], female    DOB: [birth year], 58 y.o.   MRN: [id number]  HPI She comes in for followup of her HIV. She is doing well and her labs show an undetectable viral load and CD4 count that is in the normal range. She has been on Atripla and denies any missed doses. She is given her medications by her mother. She tells me she lives with her mother now.   Review of Systems  Constitutional: Negative for fatigue and unexpected weight change.  HENT: Negative for ear discharge, sore throat and trouble swallowing.   Eyes: Negative for photophobia, pain, discharge, redness, itching and visual disturbance.  Respiratory: Negative for cough and shortness of breath.   Cardiovascular: Negative for chest pain.  Gastrointestinal: Negative for nausea and diarrhea.  Musculoskeletal: Negative for arthralgias and myalgias.  Skin: Negative for rash.  Neurological: Negative for dizziness and headaches.  Hematological: Negative for adenopathy.  Psychiatric/Behavioral: Negative for dysphoric mood.       Objective:   Physical Exam  Constitutional: She appears well-developed and well-nourished. No distress.  HENT:  Mouth/Throat: No oropharyngeal exudate.  Eyes: Conjunctivae are normal. Right eye exhibits no discharge. Left eye exhibits no discharge. No scleral icterus.  Cardiovascular: Normal rate, regular rhythm and normal heart sounds.   No murmur heard. Pulmonary/Chest: Effort normal and breath sounds normal. No respiratory distress. She has no wheezes.  Lymphadenopathy:    She has no cervical adenopathy.  Skin: No rash noted.          Assessment & Plan:

## 2014-02-06 LAB — LIPID PANEL
CHOL/HDL RATIO: 5.4 ratio
CHOLESTEROL: 232 mg/dL — AB (ref 0–200)
HDL: 43 mg/dL (ref >39)
LDL Cholesterol: 146 mg/dL — ABNORMAL HIGH (ref 0–99)
TRIGLYCERIDES: 215 mg/dL — AB (ref <150)
VLDL: 43 mg/dL — ABNORMAL HIGH (ref 0–40)

## 2014-02-06 LAB — COMPLETE METABOLIC PANEL WITH GFR
ALT: 30 U/L (ref 0–35)
AST: 48 U/L — ABNORMAL HIGH (ref 0–37)
Albumin: 4.1 g/dL (ref 3.5–5.2)
Alkaline Phosphatase: 57 U/L (ref 39–117)
BUN: 10 mg/dL (ref 6–23)
CALCIUM: 9.4 mg/dL (ref 8.4–10.5)
CHLORIDE: 103 meq/L (ref 96–112)
CO2: 27 meq/L (ref 19–32)
CREATININE: 0.79 mg/dL (ref 0.50–1.10)
GFR, EST NON AFRICAN AMERICAN: 84 mL/min
GFR, Est African American: >89 mL/min
Glucose, Bld: 87 mg/dL (ref 70–99)
Potassium: 4 mEq/L (ref 3.5–5.3)
Sodium: 139 mEq/L (ref 135–145)
Total Bilirubin: 0.4 mg/dL (ref 0.2–1.2)
Total Protein: 7.9 g/dL (ref 6.0–8.3)

## 2014-02-06 LAB — T-HELPER CELL (CD4) - (RCID CLINIC ONLY)
CD4 T CELL HELPER: 28 % — AB (ref 33–55)
CD4 T Cell Abs: 620 /uL (ref 400–2700)

## 2014-02-06 LAB — URINE CYTOLOGY ANCILLARY ONLY
CHLAMYDIA, DNA PROBE: NEGATIVE
Neisseria Gonorrhea: NEGATIVE

## 2014-02-06 LAB — RPR: RPR Ser Ql: REACTIVE — AB

## 2014-02-06 LAB — RPR TITER: RPR Titer: 1:1 {titer}

## 2014-02-06 LAB — FLUORESCENT TREPONEMAL AB(FTA)-IGG-BLD: FLUORESCENT TREPONEMAL ABS: REACTIVE — AB

## 2014-02-07 ENCOUNTER — Ambulatory Visit: Payer: Medicaid Other | Admitting: Family Medicine

## 2014-02-07 LAB — HIV-1 RNA QUANT-NO REFLEX-BLD: HIV-1 RNA Quant, Log: <1.3 {Log} (ref <1.30)

## 2014-02-19 ENCOUNTER — Other Ambulatory Visit (HOSPITAL_COMMUNITY)
Admission: RE | Admit: 2014-02-19 | Discharge: 2014-02-19 | Disposition: A | Discharge status: Home / Self Care | Payer: Medicaid Other | Source: Ambulatory Visit | Attending: Internal Medicine | Admitting: Internal Medicine

## 2014-02-19 ENCOUNTER — Ambulatory Visit (INDEPENDENT_AMBULATORY_CARE_PROVIDER_SITE_OTHER): Payer: Medicaid Other | Admitting: *Deleted

## 2014-02-19 DIAGNOSIS — Z113 Encounter for screening for infections with a predominantly sexual mode of transmission: Secondary | ICD-10-CM

## 2014-02-19 DIAGNOSIS — Z124 Encounter for screening for malignant neoplasm of cervix: Secondary | ICD-10-CM

## 2014-02-19 DIAGNOSIS — Z01411 Encounter for gynecological examination (general) (routine) with abnormal findings: Secondary | ICD-10-CM | POA: Diagnosis present

## 2014-02-19 DIAGNOSIS — Z1231 Encounter for screening mammogram for malignant neoplasm of breast: Secondary | ICD-10-CM

## 2014-02-19 NOTE — Progress Notes (Signed)
  Subjective:     [name] [name] is a 58 y.o. woman who comes in today for a  pap smear only.  Previous abnormal Pap smears: no. Contraception: given condoms  Objective:    There were no vitals taken for this visit. Pelvic Exam:  Pap smear obtained.   Assessment:    Screening pap smear.   Plan:    Follow up in one year"}, or as indicated by Pap results.  Pt given educational materials re: HIV and women, self-esteem, BSE, nutrition and diet management, PAP smears and partner safety. Pt given condoms

## 2014-02-19 NOTE — Patient Instructions (Signed)
Your PAP smear results will be ready in about a week.  I will mail them to you.  Thank you for coming to Center for your care.  Langley Gauss, Therapist, sports.

## 2014-02-20 LAB — CYTOLOGY - PAP

## 2014-02-21 ENCOUNTER — Encounter: Payer: Self-pay | Admitting: *Deleted

## 2014-04-08 ENCOUNTER — Telehealth: Payer: Self-pay | Admitting: Diagnostic Neuroimaging

## 2014-04-08 MED ORDER — LEVETIRACETAM 750 MG PO TABS: 750.0000 mg | ORAL_TABLET | Freq: Two times a day (BID) | ORAL | Status: DC

## 2014-04-08 NOTE — Telephone Encounter (Signed)
Rx has been sent.  I called back.  They are aware.   

## 2014-04-08 NOTE — Telephone Encounter (Signed)
Pt is completely out of levETIRAcetam (KEPPRA) 750 MG tablet and she needs a refill.  It needs to be called in at [facility] [phone].  Please call and advise.

## 2014-05-08 ENCOUNTER — Other Ambulatory Visit: Payer: Self-pay | Admitting: *Deleted

## 2014-05-08 DIAGNOSIS — B20 Human immunodeficiency virus [HIV] disease: Secondary | ICD-10-CM

## 2014-05-08 MED ORDER — EFAVIRENZ-EMTRICITAB-TENOFOVIR 600-200-300 MG PO TABS: 1.0000 | ORAL_TABLET | Freq: Every day | ORAL | Status: DC

## 2014-05-23 ENCOUNTER — Emergency Department (HOSPITAL_COMMUNITY)
Admission: EM | Admit: 2014-05-23 | Discharge: 2014-05-24 | Disposition: A | Discharge status: Home / Self Care | Payer: Medicaid Other | Attending: Emergency Medicine | Admitting: Emergency Medicine

## 2014-05-23 ENCOUNTER — Encounter (HOSPITAL_COMMUNITY): Payer: Self-pay | Admitting: *Deleted

## 2014-05-23 ENCOUNTER — Emergency Department (HOSPITAL_COMMUNITY): Payer: Medicaid Other

## 2014-05-23 DIAGNOSIS — Z21 Asymptomatic human immunodeficiency virus [HIV] infection status: Secondary | ICD-10-CM | POA: Insufficient documentation

## 2014-05-23 DIAGNOSIS — Z87828 Personal history of other (healed) physical injury and trauma: Secondary | ICD-10-CM | POA: Diagnosis not present

## 2014-05-23 DIAGNOSIS — R4182 Altered mental status, unspecified: Secondary | ICD-10-CM | POA: Diagnosis present

## 2014-05-23 DIAGNOSIS — N390 Urinary tract infection, site not specified: Secondary | ICD-10-CM | POA: Diagnosis not present

## 2014-05-23 DIAGNOSIS — Z87891 Personal history of nicotine dependence: Secondary | ICD-10-CM | POA: Insufficient documentation

## 2014-05-23 DIAGNOSIS — Z79899 Other long term (current) drug therapy: Secondary | ICD-10-CM | POA: Diagnosis not present

## 2014-05-23 DIAGNOSIS — G40909 Epilepsy, unspecified, not intractable, without status epilepticus: Secondary | ICD-10-CM | POA: Insufficient documentation

## 2014-05-23 LAB — COMPREHENSIVE METABOLIC PANEL
ALT: 40 U/L — AB (ref 0–35)
ANION GAP: 13 (ref 5–15)
AST: 73 U/L — AB (ref 0–37)
Albumin: 4.4 g/dL (ref 3.5–5.2)
Alkaline Phosphatase: 66 U/L (ref 39–117)
BUN: 10 mg/dL (ref 6–23)
CHLORIDE: 104 meq/L (ref 96–112)
CO2: 25 mmol/L (ref 19–32)
Calcium: 9.4 mg/dL (ref 8.4–10.5)
Creatinine, Ser: 0.98 mg/dL (ref 0.50–1.10)
GFR calc Af Amer: 73 mL/min — ABNORMAL LOW (ref >90)
GFR calc non Af Amer: 63 mL/min — ABNORMAL LOW (ref >90)
GLUCOSE: 111 mg/dL — AB (ref 70–99)
Potassium: 4 mmol/L (ref 3.5–5.1)
Sodium: 142 mmol/L (ref 135–145)
Total Bilirubin: 0.8 mg/dL (ref 0.3–1.2)
Total Protein: 8.5 g/dL — ABNORMAL HIGH (ref 6.0–8.3)

## 2014-05-23 LAB — CBC
HCT: 38.5 % (ref 36.0–46.0)
HEMOGLOBIN: 12.9 g/dL (ref 12.0–15.0)
MCH: 27.7 pg (ref 26.0–34.0)
MCHC: 33.5 g/dL (ref 30.0–36.0)
MCV: 82.8 fL (ref 78.0–100.0)
Platelets: 145 10*3/uL — ABNORMAL LOW (ref 150–400)
RBC: 4.65 MIL/uL (ref 3.87–5.11)
RDW: 14.5 % (ref 11.5–15.5)
WBC: 9.3 10*3/uL (ref 4.0–10.5)

## 2014-05-23 LAB — ETHANOL: Alcohol, Ethyl (B): <5 mg/dL (ref 0–9)

## 2014-05-23 LAB — TROPONIN I: Troponin I: <0.03 ng/mL (ref <0.031)

## 2014-05-23 NOTE — ED Notes (Signed)
Pt asked again for urine sample, states she "cannot give any" and that she "wants to get out of here." pt unwilling to have catheter to obtain urine sample.

## 2014-05-23 NOTE — ED Notes (Addendum)
Pts caregiver states that patient has been having some hallucinations for the past 3 days, states that pt has been "arguing with someone late at night" when noone was around. Pts caregiver states pt has been acting like she sees and hears things that are not there. Pt denies these claims. Resting on stretcher, calm, cooperative, alert, oriented to person and time, pt unsure where she is or why she is here, caregiver states this is baseline for patient. NAD

## 2014-05-23 NOTE — ED Notes (Signed)
Family has come to sit with patient.

## 2014-05-23 NOTE — ED Notes (Signed)
Pt in with family stating that patient has not been acting like herself for the last few days, patient appears to be hallucinating, worse at night, symptoms seem worse today, pt alert and oriented to person and place, disoriented to time which is her baseline

## 2014-05-23 NOTE — ED Notes (Addendum)
When this RN went into the pt's room to disconnect her from the monitor to go to CT, this RN found the pt standing at the end of the bed stating "I gotta get out of here!". This RN helped the pt back into bed before she was transported to CT. Charge RN notified and a [employer] cabin crew will sit with the pt.

## 2014-05-23 NOTE — ED Notes (Signed)
Patient transported to CT 

## 2014-05-23 NOTE — ED Provider Notes (Signed)
CSN: [id number]     Arrival date & time 05/23/14  1752 History   First MD Initiated Contact with Patient 05/23/14 1917     Chief Complaint  Patient presents with  . Altered Mental Status     (Consider location/radiation/quality/duration/timing/severity/associated sxs/prior Treatment) HPI Comments: 59 year old female, she has a history of significant substance abuse and subsequent mental health disease secondary to her substance abuse. She presents with her caregiver who was checking on her today when her mother who is 26 years old called to say that her daughter, Miss Derk, was hallucinating over the last couple of days. She has been arguing with someone who is not very late at night, she was on the front porch today yelling and cars were going by and talking to the house as if there was somebody there. She has had no other significant complaints, no trauma, there is unclear history as to substance abuse recently. Reportedly there was no diarrhea, no vomiting, no fevers, no coughing or shortness of breath. The agent denies having any complaints. She does have a history of HIV and is well controlled on medications with normal CD4 tested recently. She does have a history of seizures and was recently admitted for altered mental status after a seizure, she is on Keppra alone.  Patient is a 59 y.o. female presenting with altered mental status. The history is provided by the patient.  Altered Mental Status   Past Medical History  Diagnosis Date  . HIV infection   . Substance abuse   . H/O head injury   . Seizures   . Dementia   . Depression    Past Surgical History  Procedure Laterality Date  . Unremarkable     Family History  Problem Relation Age of Onset  . Colon cancer Neg Hx   . Diabetes Mother   . Behavior problems Mother   . Headache Mother    History  Substance Use Topics  . Smoking status: Former Research scientist (life sciences)  . Smokeless tobacco: Never Used  . Alcohol Use: No   OB History     No data available     Review of Systems  Unable to perform ROS: Mental status change      Allergies  Review of patient's allergies indicates no known allergies.  Home Medications   Prior to Admission medications   Medication Sig Start Date End Date Taking? Authorizing Provider  efavirenz-emtricitabine-tenofovir (ATRIPLA) [phone] MG per tablet Take 1 tablet by mouth at bedtime. 05/08/14  Yes Thayer Headings, MD  levETIRAcetam (KEPPRA) 750 MG tablet Take 1 tablet (750 mg total) by mouth 2 (two) times daily. 04/08/14  Yes Penni Bombard, MD  Multiple Vitamin (MULTIVITAMIN) tablet Take 1 tablet by mouth daily.     Yes Historical Provider, MD  sertraline (ZOLOFT) 50 MG tablet Take 3 tablets (150 mg total) by mouth daily. 11/07/13  Yes Vikram R Penumalli, MD   BP 118/77 mmHg  Pulse 94  Temp(Src) 98.1 F (36.7 C) (Oral)  Resp 15  SpO2 96% Physical Exam  Constitutional: She appears well-developed and well-nourished. No distress.  HENT:  Head: Normocephalic and atraumatic.  Mouth/Throat: Oropharynx is clear and moist. No oropharyngeal exudate.  Very poor dentition, no signs of intraoral abscesses, trismus or torticollis  Eyes: Conjunctivae and EOM are normal. Pupils are equal, round, and reactive to light. Right eye exhibits no discharge. Left eye exhibits no discharge. No scleral icterus.  Neck: Normal range of motion. Neck supple. No JVD present. No  thyromegaly present.  Cardiovascular: Normal rate, regular rhythm, normal heart sounds and intact distal pulses.  Exam reveals no gallop and no friction rub.   No murmur heard. Pulmonary/Chest: Effort normal and breath sounds normal. No respiratory distress. She has no wheezes. She has no rales.  Abdominal: Soft. Bowel sounds are normal. She exhibits no distension and no mass. There is no tenderness.  Musculoskeletal: Normal range of motion. She exhibits no edema or tenderness.  Lymphadenopathy:    She has no cervical adenopathy.   Neurological: She is alert. Coordination normal.  Slow to follow commands, moves all extremities 4 with normal strength, cranial nerves III through XII intact.  Skin: Skin is warm and dry. No rash noted. No erythema.  Psychiatric: She has a normal mood and affect. Her behavior is normal.  Nursing note and vitals reviewed.   ED Course  Procedures (including critical care time) Labs Review Labs Reviewed  CBC - Abnormal; Notable for the following:    Platelets 145 (*)    All other components within normal limits  COMPREHENSIVE METABOLIC PANEL - Abnormal; Notable for the following:    Glucose, Bld 111 (*)    Total Protein 8.5 (*)    AST 73 (*)    ALT 40 (*)    GFR calc non Af Amer 63 (*)    GFR calc Af Amer 73 (*)    All other components within normal limits  URINALYSIS, ROUTINE W REFLEX MICROSCOPIC  URINE RAPID DRUG SCREEN (HOSP PERFORMED)  ETHANOL  TROPONIN I  AMMONIA    Imaging Review No results found.   EKG Interpretation   Date/Time:  Thursday May 23 2014 20:03:04 EST Ventricular Rate:  91 PR Interval:  119 QRS Duration: 72 QT Interval:  357 QTC Calculation: 439 R Axis:   74 Text Interpretation:  Age not entered, assumed to be  59 years old for  purpose of ECG interpretation Sinus rhythm Borderline short PR interval  Nonspecific T abnormalities, diffuse leads Since last tracing T wave  abnormality now present in diffuse leads Abnormal ekg Confirmed by Sabra Heck   MD, Quantisha Marsicano ([id number]) on 05/23/2014 8:40:48 PM      MDM   Final diagnoses:  Altered mental state    The patient has normal vital signs, she has an abnormal exam and that she is slightly confused and slow to answer questions. She is not able to tell me the date or where she is, according to the caregiver she is usually very interactive with him, she is able to answer simple questions, she does not usually hallucinate, this is new. We'll check for any kind of drug screen abnormalities, CT scan of the  head, anticipate consultation with either neurology or psychiatry depending on results and outcomes. She does not appear agitated, she is not actively hallucinating   Change of shift - care signed out to Dr. Tomi Bamberger - pending psych consult for new onset hallucinations  Labs unremarkable overall, UA pending at change of shift.     Johnna Acosta, MD 05/23/14 [phone]

## 2014-05-23 NOTE — ED Notes (Signed)
This technician is sitting with the patient to ensure that she does not exit her bed without assistance.

## 2014-05-24 DIAGNOSIS — F0390 Unspecified dementia without behavioral disturbance: Secondary | ICD-10-CM

## 2014-05-24 LAB — URINE MICROSCOPIC-ADD ON

## 2014-05-24 LAB — URINALYSIS, ROUTINE W REFLEX MICROSCOPIC
GLUCOSE, UA: NEGATIVE mg/dL
Hgb urine dipstick: NEGATIVE
Ketones, ur: 40 mg/dL — AB
NITRITE: NEGATIVE
PH: 6 (ref 5.0–8.0)
Protein, ur: NEGATIVE mg/dL
Specific Gravity, Urine: 1.029 (ref 1.005–1.030)
Urobilinogen, UA: 1 mg/dL (ref 0.0–1.0)

## 2014-05-24 LAB — RAPID URINE DRUG SCREEN, HOSP PERFORMED
Amphetamines: NOT DETECTED
BARBITURATES: NOT DETECTED
Benzodiazepines: NOT DETECTED
COCAINE: NOT DETECTED
Opiates: NOT DETECTED
Tetrahydrocannabinol: NOT DETECTED

## 2014-05-24 MED ORDER — EFAVIRENZ-EMTRICITAB-TENOFOVIR 600-200-300 MG PO TABS
1.0000 | ORAL_TABLET | Freq: Every day | ORAL | Status: DC
  Filled 2014-05-24: qty 1

## 2014-05-24 MED ORDER — CEPHALEXIN 500 MG PO CAPS: 500.0000 mg | ORAL_CAPSULE | Freq: Four times a day (QID) | ORAL | Status: DC

## 2014-05-24 MED ORDER — LEVETIRACETAM 750 MG PO TABS
750.0000 mg | ORAL_TABLET | Freq: Two times a day (BID) | ORAL | Status: DC
  Administered 2014-05-24: 750 mg via ORAL
  Filled 2014-05-24 (×2): qty 1

## 2014-05-24 MED ORDER — SERTRALINE HCL 50 MG PO TABS
150.0000 mg | ORAL_TABLET | Freq: Every day | ORAL | Status: DC
  Administered 2014-05-24: 150 mg via ORAL
  Filled 2014-05-24: qty 3

## 2014-05-24 NOTE — ED Notes (Signed)
Called shiela in the lab. She will add on the culture

## 2014-05-24 NOTE — ED Notes (Signed)
PT BELONGINGS RETURNED TO HER AT DISCHARGE

## 2014-05-24 NOTE — ED Notes (Signed)
Pt acting paranoid, looking around her like she is afraid. Also continues to talk to someone when noone is around.

## 2014-05-24 NOTE — Consult Note (Signed)
Telepsych Consultation   Reason for Consult:  Psychosis Referring Physician:  EDP [name] [name] is an 59 y.o. female.  Assessment: AXIS I:  Delirium/Dementia (strong evidence for organic etiology) AXIS II:  Deferred AXIS III:   Past Medical History  Diagnosis Date  . HIV infection   . Substance abuse   . H/O head injury   . Seizures   . Dementia   . Depression    AXIS IV:  Medical factors AXIS V:  51-60 moderate symptoms  Plan:  No evidence of imminent risk to self or others at present.   Patient does not meet criteria for psychiatric inpatient admission.  Subjective:   [name] [name] is a 59 y.o. female patient admitted with a medical history of poorly controlled HIV, altered mental status secondary to seizures, and significant dementia. On interview pt reports visual hallucinations, specifically shadows. Pt was alert and oriented to person and states that she is at the hospital, but disoriented to time and situation which has been mentioned in prior notes to be her baseline. The interview was complicated by garbled speech and inattentiveness. Pt clearly denies SI, HI and auditory hallucinations, and contracts for safety at this time. Given patient's history and lack of psychiatric admissions, current presentation is likely consistent with delirium. Additionally, patient's urinalysis is consistent with UTI showing large leukocytes and high WBC content.   HPI:  [name] [name] is a8 y.o. female who voluntarily presents to [facility] with worsening hallucinations. Pt was brought in by family, stating that pt has not been acting like herself for the last few days, she appears to be hallucinating and the hallucinations are worse at night. Upon arrival at the [place], pt alert and oriented to person and place but disoriented to time which her baseline. This Probation officer attempted to interview pt, however she appeared preoccupied and barely audible, when asked what year it is, pt replied 1995,  then 1999. The following is collateral and hx is provided by the family: pt has been arguing with someone who is not there, she's been on the front porch, yelling and cars were going by and talking to the house as if there was somebody there. Per hx, pt has a hx of seizures and was recently admitted for altered mental status after a seizure. Pt also has a hx of HIV and dementia.  HPI Elements:   Location:  Psychiatric. Quality:  Fluctuating. Severity:  Moderate. Timing:  Intermittent. Duration:  Transient with history of such. Context:  Exacerbation of underlying cognitive deficits secondary to delirium .  Past Psychiatric History: Past Medical History  Diagnosis Date  . HIV infection   . Substance abuse   . H/O head injury   . Seizures   . Dementia   . Depression     reports that she has quit smoking. She has never used smokeless tobacco. She reports that she uses illicit drugs about 7 times per week. She reports that she does not drink alcohol. Family History  Problem Relation Age of Onset  . Colon cancer Neg Hx   . Diabetes Mother   . Behavior problems Mother   . Headache Mother    Family History Substance Abuse: No Family Supports: Yes, List: (Family members ) Living Arrangements: Parent (Lives with mother ) Can pt return to current living arrangement?: Yes Allergies:  No Known Allergies  ACT Assessment Complete:  Yes:    Educational Status    Risk to Self: Risk to self with the  past 6 months Suicidal Ideation: No Suicidal Intent: No Is patient at risk for suicide?: No Suicidal Plan?: No Access to Means: No What has been your use of drugs/alcohol within the last 12 months?: Past hx of SA  Previous Attempts/Gestures: No How many times?: 0 Other Self Harm Risks: None  Triggers for Past Attempts: None known Intentional Self Injurious Behavior: None Family Suicide History: No Recent stressful life event(s): Other (Comment) (Worsening hallucinations  ) Persecutory voices/beliefs?: No Depression: No Depression Symptoms:  (None reported ) Substance abuse history and/or treatment for substance abuse?: Yes Suicide prevention information given to non-admitted patients: Not applicable  Risk to Others: Risk to Others within the past 6 months Homicidal Ideation: No Thoughts of Harm to Others: No Current Homicidal Intent: No Current Homicidal Plan: No Access to Homicidal Means: No Identified Victim: None  History of harm to others?: No Assessment of Violence: None Noted Violent Behavior Description: None  Does patient have access to weapons?: No Criminal Charges Pending?: No Does patient have a court date: No  Abuse: Abuse/Neglect Assessment (Assessment to be complete while patient is alone) Physical Abuse: Denies Verbal Abuse: Denies Sexual Abuse: Denies Exploitation of patient/patient's resources: Denies Self-Neglect: Denies  Prior Inpatient Therapy: Prior Inpatient Therapy Prior Inpatient Therapy: No Prior Therapy Dates: Unk  Prior Therapy Facilty/Provider(s): Unk  Reason for Treatment: Unk   Prior Outpatient Therapy: Prior Outpatient Therapy Prior Outpatient Therapy: No Prior Therapy Dates: Unk  Prior Therapy Facilty/Provider(s): Unk  Reason for Treatment: Unk   Additional Information: Additional Information 1:1 In Past 12 Months?: No CIRT Risk: No Elopement Risk: No Does patient have medical clearance?: Yes                  Objective: Blood pressure 114/79, pulse 84, temperature 98.3 F (36.8 C), temperature source Oral, resp. rate 18, SpO2 98 %.There is no weight on file to calculate BMI. Results for orders placed or performed during the hospital encounter of 05/23/14 (from the past 72 hour(s))  CBC     Status: Abnormal   Collection Time: 05/23/14  6:01 PM  Result Value Ref Range   WBC 9.3 4.0 - 10.5 K/uL   RBC 4.65 3.87 - 5.11 MIL/uL   Hemoglobin 12.9 12.0 - 15.0 g/dL   HCT 38.5 36.0 - 46.0 %   MCV  82.8 78.0 - 100.0 fL   MCH 27.7 26.0 - 34.0 pg   MCHC 33.5 30.0 - 36.0 g/dL   RDW 14.5 11.5 - 15.5 %   Platelets 145 (L) 150 - 400 K/uL  Comprehensive metabolic panel     Status: Abnormal   Collection Time: 05/23/14  6:01 PM  Result Value Ref Range   Sodium 142 135 - 145 mmol/L    Comment: Please note change in reference range.   Potassium 4.0 3.5 - 5.1 mmol/L    Comment: Please note change in reference range.   Chloride 104 96 - 112 mEq/L   CO2 25 19 - 32 mmol/L   Glucose, Bld 111 (H) 70 - 99 mg/dL   BUN 10 6 - 23 mg/dL   Creatinine, Ser 0.98 0.50 - 1.10 mg/dL   Calcium 9.4 8.4 - 10.5 mg/dL   Total Protein 8.5 (H) 6.0 - 8.3 g/dL   Albumin 4.4 3.5 - 5.2 g/dL   AST 73 (H) 0 - 37 U/L   ALT 40 (H) 0 - 35 U/L   Alkaline Phosphatase 66 39 - 117 U/L   Total Bilirubin 0.8 0.3 -  1.2 mg/dL   GFR calc non Af Amer 63 (L) >90 mL/min   GFR calc Af Amer 73 (L) >90 mL/min    Comment: (NOTE) The eGFR has been calculated using the CKD EPI equation. This calculation has not been validated in all clinical situations. eGFR's persistently <90 mL/min signify possible Chronic Kidney Disease.    Anion gap 13 5 - 15  Ethanol     Status: None   Collection Time: 05/23/14  7:55 PM  Result Value Ref Range   Alcohol, Ethyl (B) <5 0 - 9 mg/dL    Comment:        LOWEST DETECTABLE LIMIT FOR SERUM ALCOHOL IS 11 mg/dL FOR MEDICAL PURPOSES ONLY   Troponin I     Status: None   Collection Time: 05/23/14  7:56 PM  Result Value Ref Range   Troponin I <0.03 <0.031 ng/mL    Comment:        NO INDICATION OF MYOCARDIAL INJURY. Please note change in reference range.   Urinalysis, Routine w reflex microscopic     Status: Abnormal   Collection Time: 05/23/14 11:48 PM  Result Value Ref Range   Color, Urine AMBER (A) YELLOW    Comment: BIOCHEMICALS MAY BE AFFECTED BY COLOR   APPearance CLEAR CLEAR   Specific Gravity, Urine 1.029 1.005 - 1.030   pH 6.0 5.0 - 8.0   Glucose, UA NEGATIVE NEGATIVE mg/dL   Hgb  urine dipstick NEGATIVE NEGATIVE   Bilirubin Urine SMALL (A) NEGATIVE   Ketones, ur 40 (A) NEGATIVE mg/dL   Protein, ur NEGATIVE NEGATIVE mg/dL   Urobilinogen, UA 1.0 0.0 - 1.0 mg/dL   Nitrite NEGATIVE NEGATIVE   Leukocytes, UA LARGE (A) NEGATIVE  Urine rapid drug screen (hosp performed)     Status: None   Collection Time: 05/23/14 11:48 PM  Result Value Ref Range   Opiates NONE DETECTED NONE DETECTED   Cocaine NONE DETECTED NONE DETECTED   Benzodiazepines NONE DETECTED NONE DETECTED   Amphetamines NONE DETECTED NONE DETECTED   Tetrahydrocannabinol NONE DETECTED NONE DETECTED   Barbiturates NONE DETECTED NONE DETECTED    Comment:        DRUG SCREEN FOR MEDICAL PURPOSES ONLY.  IF CONFIRMATION IS NEEDED FOR ANY PURPOSE, NOTIFY LAB WITHIN 5 DAYS.        LOWEST DETECTABLE LIMITS FOR URINE DRUG SCREEN Drug Class       Cutoff (ng/mL) Amphetamine      1000 Barbiturate      200 Benzodiazepine   202 Tricyclics       542 Opiates          300 Cocaine          300 THC              50   Urine microscopic-add on     Status: None   Collection Time: 05/23/14 11:48 PM  Result Value Ref Range   Squamous Epithelial / LPF RARE RARE   WBC, UA 7-10 <3 WBC/hpf   RBC / HPF 0-2 <3 RBC/hpf   Bacteria, UA RARE RARE   Labs are reviewed and are pertinent for leukocytes in UA and negative drug and alcohol screen.  Current Facility-Administered Medications  Medication Dose Route Frequency Provider Last Rate Last Dose  . efavirenz-emtricitabine-tenofovir (ATRIPLA) 600-200-300 MG per tablet 1 tablet  1 tablet Oral QHS Everlene Balls, MD      . levETIRAcetam (KEPPRA) tablet 750 mg  750 mg Oral BID Everlene Balls, MD      .  sertraline (ZOLOFT) tablet 150 mg  150 mg Oral Daily Everlene Balls, MD       Current Outpatient Prescriptions  Medication Sig Dispense Refill  . efavirenz-emtricitabine-tenofovir (ATRIPLA) [phone] MG per tablet Take 1 tablet by mouth at bedtime. 30 tablet 5  . levETIRAcetam (KEPPRA)  750 MG tablet Take 1 tablet (750 mg total) by mouth 2 (two) times daily. 60 tablet 5  . Multiple Vitamin (MULTIVITAMIN) tablet Take 1 tablet by mouth daily.      . sertraline (ZOLOFT) 50 MG tablet Take 3 tablets (150 mg total) by mouth daily. 90 tablet 12    Psychiatric Specialty Exam:     Blood pressure 114/79, pulse 84, temperature 98.3 F (36.8 C), temperature source Oral, resp. rate 18, SpO2 98 %.There is no weight on file to calculate BMI.  General Appearance: Disheveled  Eye Contact::  Minimal  Speech:  Garbled and Slow  Volume:  Decreased  Mood:  Dysphoric  Affect:  Non-Congruent  Thought Process:  Loose  Orientation:  Other:  Orriented to person and place but disorriented to time and situation, mentioned by family that this is her baseline  Thought Content:  Hallucinations: Visual  Suicidal Thoughts:  No  Homicidal Thoughts:  No  Memory:  Immediate;   Poor Recent;   Poor Remote;   Poor  Judgement:  Impaired  Insight:  Lacking  Psychomotor Activity:  Decreased  Concentration:  Fair  Recall:  Poor  Akathisia:  No  Handed:    AIMS (if indicated):     Assets:  Desire for Improvement Resilience  Sleep:      Treatment Plan Summary: Given patient's history of altered mental status and significant dementia along with current delirium, this case should be managed by the medical team. However, we are suggesting the possibility of using low dose Risperidol 0.25m BID to manage psychotic symptoms in the interim.  Disposition:  See above.  Case discussed with Dr. KHorald Pollen JElyse Jarvis FNP-BC 05/24/2014 9:15 AM

## 2014-05-24 NOTE — ED Provider Notes (Signed)
[name] [name], TSS, called to discuss reason for consult.   Janice Norrie, MD 05/24/14 [phone]

## 2014-05-24 NOTE — ED Notes (Signed)
Pt standing at doorway, stating she "wants to go home." pt appears to be talking to someone when no one is around.

## 2014-05-24 NOTE — ED Notes (Signed)
Pt placed in paper scrubs, belongings collected, security called to come wand patient. Tech sitting with patient at this time.

## 2014-05-24 NOTE — ED Notes (Signed)
Pt has been repeatedly looking around laughing and calling out "Ya" "Mama, I'm coming [place]" "No I'm not sleeping" "Good Lord" without being prompted by conversation with someone. Pt has been asking to leave because her "Mama" needs her. Pt will exit her bed and leave the room with technician sitter following her and encouraging her to return to bed in an attempt to find her "Mama". Secondary school teacher has repeatedly told her that her Mom is fine and is at home sleeping and we need her to stay in bed so we can make sure that she is all right. Pt holds herself with her arms held close to her body and her hands tightly clasped together.

## 2014-05-24 NOTE — ED Notes (Signed)
TTS MACHINE MOVED INTO PATIENT ROOM FOR CONSULT

## 2014-05-24 NOTE — ED Notes (Signed)
Called staffing office for sitter

## 2014-05-24 NOTE — ED Notes (Signed)
Social worker called to assure pt has caregiver at home before discharge.

## 2014-05-24 NOTE — ED Notes (Signed)
Dr Lamar Benes at bedside

## 2014-05-24 NOTE — Discharge Instructions (Signed)
You have a urinary tract infection. Take antibiotic as directed for 1 week. Follow up with your primary care doctor.  Urinary Tract Infection A urinary tract infection (UTI) can occur any place along the urinary tract. The tract includes the kidneys, ureters, bladder, and urethra. A type of germ called bacteria often causes a UTI. UTIs are often helped with antibiotic medicine.  HOME CARE   If given, take antibiotics as told by your doctor. Finish them even if you start to feel better.  Drink enough fluids to keep your pee (urine) clear or pale yellow.  Avoid tea, drinks with caffeine, and bubbly (carbonated) drinks.  Pee often. Avoid holding your pee in for a long time.  Pee before and after having sex (intercourse).  Wipe from front to back after you poop (bowel movement) if you are a woman. Use each tissue only once. GET HELP RIGHT AWAY IF:   You have back pain.  You have lower belly (abdominal) pain.  You have chills.  You feel sick to your stomach (nauseous).  You throw up (vomit).  Your burning or discomfort with peeing does not go away.  You have a fever.  Your symptoms are not better in 3 days. MAKE SURE YOU:   Understand these instructions.  Will watch your condition.  Will get help right away if you are not doing well or get worse. Document Released: 10/13/2007 Document Revised: 01/19/2012 Document Reviewed: 11/25/2011 [facility] Patient Information 2015 [place], [place]. This information is not intended to replace advice given to you by your health care provider. Make sure you discuss any questions you have with your health care provider. Altered Mental Status Altered mental status most often refers to an abnormal change in your responsiveness and awareness. It can affect your speech, thought, mobility, memory, attention span, or alertness. It can range from slight confusion to complete unresponsiveness (coma). Altered mental status can be a sign of a serious  underlying medical condition. Rapid evaluation and medical treatment is necessary for patients having an altered mental status. CAUSES   Low blood sugar (hypoglycemia) or diabetes.  Severe loss of body fluids (dehydration) or a body salt (electrolyte) imbalance.  A stroke or other neurologic problem, such as dementia or delirium.  A head injury or tumor.  A drug or alcohol overdose.  Exposure to toxins or poisons.  Depression, anxiety, and stress.  A low oxygen level (hypoxia).  An infection.  Blood loss.  Twitching or shaking (seizure).  Heart problems, such as heart attack or heart rhythm problems (arrhythmias).  A body temperature that is too low or too high (hypothermia or hyperthermia). DIAGNOSIS  A diagnosis is based on your history, symptoms, physical and neurologic examinations, and diagnostic tests. Diagnostic tests may include:  Measurement of your blood pressure, pulse, breathing, and oxygen levels (vital signs).  Blood tests.  Urine tests.  X-ray exams.  A computerized magnetic scan (magnetic resonance imaging, MRI).  A computerized X-ray scan (computed tomography, CT scan). TREATMENT  Treatment will depend on the cause. Treatment may include:  Management of an underlying medical or mental health condition.  Critical care or support in the hospital. [place]   Only take over-the-counter or prescription medicines for pain, discomfort, or fever as directed by your caregiver.  Manage underlying conditions as directed by your caregiver.  Eat a healthy, well-balanced diet to maintain strength.  Join a support group or prevention program to cope with the condition or trauma that caused the altered mental status.  Ask your caregiver to help choose a program that works for you.  Follow up with your caregiver for further examination, therapy, or testing as directed. SEEK MEDICAL CARE IF:   You feel unwell or have chills.  You or your  family notice a change in your behavior or your alertness.  You have trouble following your caregiver's treatment plan.  You have questions or concerns. SEEK IMMEDIATE MEDICAL CARE IF:   You have a rapid heartbeat or have chest pain.  You have difficulty breathing.  You have a fever.  You have a headache with a stiff neck.  You cough up blood.  You have blood in your urine or stool.  You have severe agitation or confusion. MAKE SURE YOU:   Understand these instructions.  Will watch your condition.  Will get help right away if you are not doing well or get worse. Document Released: 10/14/2009 Document Revised: 07/19/2011 Document Reviewed: 10/14/2009 [facility] Patient Information 2015 [place], [place]. This information is not intended to replace advice given to you by your health care provider. Make sure you discuss any questions you have with your health care provider.

## 2014-05-24 NOTE — ED Provider Notes (Signed)
Patient evaluated by Dr. Lamar Benes, no altered mental status or psychosis at this time. Urinalysis positive for infection. He advises to treat for urinary tract infection and discharged home. He does not feel psychiatric medications are appropriate at this time. Vital signs stable. Treat with Keflex. She has 24-hour care at home. Stable for discharge.  Carman Ching, PA-C 05/24/14 [id number]

## 2014-05-24 NOTE — Consult Note (Signed)
[facility] Face-to-Face Psychiatry Consult   Reason for Consult:  UTI and hallucination x 2 days and dementia by histroy Referring Physician:  EDP  [name] [name] is an 59 y.o. female. Total Time spent with patient: 45 minutes  Assessment: AXIS I:  Dementia AXIS II:  Deferred AXIS III:   Past Medical History  Diagnosis Date  . HIV infection   . Substance abuse   . H/O head injury   . Seizures   . Dementia   . Depression    AXIS IV:  other psychosocial or environmental problems, problems related to social environment and problems with primary support group AXIS V:  51-60 moderate symptoms  Plan:  No evidence of imminent risk to self or others at present.   Patient does not meet criteria for psychiatric inpatient admission. Supportive therapy provided about ongoing stressors.  Subjective:   [name] [name] is a 59 y.o. female patient admitted with UTI and hallucination.  HPI:  [name] [name] is a 59 y.o. female brought in by family for worsening hallucinations. Patient has history of HIV and dementia and seizure disorder. Patient seen and chart reviewed and case discussed with staff RN and EDP. Patient appeared lying in her bed, awake, alert but not oriented well except her name and knows that she is staying with her mother who is 75 years old. She able to sip water from a cup and able to swallow when given ice cream and fed her. She was unable to feed her self when given ice cream cup and spoon. Patient stated that her mother sent her to hospital. Patient is poor historian, does not appeared to responding to internal stimuli. Patient has dehydration and ketone in urine and needs to be treated. She has been taking sertraline but no known antipsychotic treatment and no acute psych hospitalization at [facility]. Patient does not meet criteria of acute psych hospital and not recommends antipsychotics at this time.   Please review the following information for further details: Upon arrival at the  [facility], pt alert and oriented to person and place but disoriented to time which her baseline. This Probation officer attempted to interview pt, however she appeared preoccupied and barely audible, when asked what year it is, pt replied 1995, then 1999. The following is collateral and hx is provided by the family: pt has been arguing with someone who is not there, she's been on the front porch, yelling and cars were going by and talking to the house as if there was somebody there. Per hx, pt has a hx of seizures and was recently admitted for altered mental status after a seizure. Pt also has a hx of HIV and dementia.   HPI Elements:   Location:  auditory hallucination. Quality:  poor insihgt. Severity:  AMS. Timing:  UTI . Duration:  two days. Context:  psychosis secondary to medical condition..  Past Psychiatric History: Past Medical History  Diagnosis Date  . HIV infection   . Substance abuse   . H/O head injury   . Seizures   . Dementia   . Depression     reports that she has quit smoking. She has never used smokeless tobacco. She reports that she uses illicit drugs about 7 times per week. She reports that she does not drink alcohol. Family History  Problem Relation Age of Onset  . Colon cancer Neg Hx   . Diabetes Mother   . Behavior problems Mother   . Headache Mother    Family  History Substance Abuse: No Family Supports: Yes, List: (Family members ) Living Arrangements: Parent (Lives with mother ) Can pt return to current living arrangement?: Yes Abuse/Neglect [facility]) Physical Abuse: Denies Verbal Abuse: Denies Sexual Abuse: Denies Allergies:  No Known Allergies  ACT Assessment Complete:  Yes:    Educational Status    Risk to Self: Risk to self with the past 6 months Suicidal Ideation: No Suicidal Intent: No Is patient at risk for suicide?: No Suicidal Plan?: No Access to Means: No What has been your use of drugs/alcohol within the last 12 months?: Past hx of SA   Previous Attempts/Gestures: No How many times?: 0 Other Self Harm Risks: None  Triggers for Past Attempts: None known Intentional Self Injurious Behavior: None Family Suicide History: No Recent stressful life event(s): Other (Comment) (Worsening hallucinations ) Persecutory voices/beliefs?: No Depression: No Depression Symptoms:  (None reported ) Substance abuse history and/or treatment for substance abuse?: Yes Suicide prevention information given to non-admitted patients: Not applicable  Risk to Others: Risk to Others within the past 6 months Homicidal Ideation: No Thoughts of Harm to Others: No Current Homicidal Intent: No Current Homicidal Plan: No Access to Homicidal Means: No Identified Victim: None  History of harm to others?: No Assessment of Violence: None Noted Violent Behavior Description: None  Does patient have access to weapons?: No Criminal Charges Pending?: No Does patient have a court date: No  Abuse: Abuse/Neglect Assessment (Assessment to be complete while patient is alone) Physical Abuse: Denies Verbal Abuse: Denies Sexual Abuse: Denies Exploitation of patient/patient's resources: Denies Self-Neglect: Denies  Prior Inpatient Therapy: Prior Inpatient Therapy Prior Inpatient Therapy: No Prior Therapy Dates: Unk  Prior Therapy Facilty/Provider(s): Unk  Reason for Treatment: Unk   Prior Outpatient Therapy: Prior Outpatient Therapy Prior Outpatient Therapy: No Prior Therapy Dates: Unk  Prior Therapy Facilty/Provider(s): Unk  Reason for Treatment: Unk   Additional Information: Additional Information 1:1 In Past 12 Months?: No CIRT Risk: No Elopement Risk: No Does patient have medical clearance?: Yes   Objective: Blood pressure 114/79, pulse 84, temperature 98.3 F (36.8 C), temperature source Oral, resp. rate 18, SpO2 98 %.There is no weight on file to calculate BMI. Results for orders placed or performed during the hospital encounter of 05/23/14  (from the past 72 hour(s))  CBC     Status: Abnormal   Collection Time: 05/23/14  6:01 PM  Result Value Ref Range   WBC 9.3 4.0 - 10.5 K/uL   RBC 4.65 3.87 - 5.11 MIL/uL   Hemoglobin 12.9 12.0 - 15.0 g/dL   HCT 38.5 36.0 - 46.0 %   MCV 82.8 78.0 - 100.0 fL   MCH 27.7 26.0 - 34.0 pg   MCHC 33.5 30.0 - 36.0 g/dL   RDW 14.5 11.5 - 15.5 %   Platelets 145 (L) 150 - 400 K/uL  Comprehensive metabolic panel     Status: Abnormal   Collection Time: 05/23/14  6:01 PM  Result Value Ref Range   Sodium 142 135 - 145 mmol/L    Comment: Please note change in reference range.   Potassium 4.0 3.5 - 5.1 mmol/L    Comment: Please note change in reference range.   Chloride 104 96 - 112 mEq/L   CO2 25 19 - 32 mmol/L   Glucose, Bld 111 (H) 70 - 99 mg/dL   BUN 10 6 - 23 mg/dL   Creatinine, Ser 0.98 0.50 - 1.10 mg/dL   Calcium 9.4 8.4 - 10.5 mg/dL  Total Protein 8.5 (H) 6.0 - 8.3 g/dL   Albumin 4.4 3.5 - 5.2 g/dL   AST 73 (H) 0 - 37 U/L   ALT 40 (H) 0 - 35 U/L   Alkaline Phosphatase 66 39 - 117 U/L   Total Bilirubin 0.8 0.3 - 1.2 mg/dL   GFR calc non Af Amer 63 (L) >90 mL/min   GFR calc Af Amer 73 (L) >90 mL/min    Comment: (NOTE) The eGFR has been calculated using the CKD EPI equation. This calculation has not been validated in all clinical situations. eGFR's persistently <90 mL/min signify possible Chronic Kidney Disease.    Anion gap 13 5 - 15  Ethanol     Status: None   Collection Time: 05/23/14  7:55 PM  Result Value Ref Range   Alcohol, Ethyl (B) <5 0 - 9 mg/dL    Comment:        LOWEST DETECTABLE LIMIT FOR SERUM ALCOHOL IS 11 mg/dL FOR MEDICAL PURPOSES ONLY   Troponin I     Status: None   Collection Time: 05/23/14  7:56 PM  Result Value Ref Range   Troponin I <0.03 <0.031 ng/mL    Comment:        NO INDICATION OF MYOCARDIAL INJURY. Please note change in reference range.   Urinalysis, Routine w reflex microscopic     Status: Abnormal   Collection Time: 05/23/14 11:48 PM   Result Value Ref Range   Color, Urine AMBER (A) YELLOW    Comment: BIOCHEMICALS MAY BE AFFECTED BY COLOR   APPearance CLEAR CLEAR   Specific Gravity, Urine 1.029 1.005 - 1.030   pH 6.0 5.0 - 8.0   Glucose, UA NEGATIVE NEGATIVE mg/dL   Hgb urine dipstick NEGATIVE NEGATIVE   Bilirubin Urine SMALL (A) NEGATIVE   Ketones, ur 40 (A) NEGATIVE mg/dL   Protein, ur NEGATIVE NEGATIVE mg/dL   Urobilinogen, UA 1.0 0.0 - 1.0 mg/dL   Nitrite NEGATIVE NEGATIVE   Leukocytes, UA LARGE (A) NEGATIVE  Urine rapid drug screen (hosp performed)     Status: None   Collection Time: 05/23/14 11:48 PM  Result Value Ref Range   Opiates NONE DETECTED NONE DETECTED   Cocaine NONE DETECTED NONE DETECTED   Benzodiazepines NONE DETECTED NONE DETECTED   Amphetamines NONE DETECTED NONE DETECTED   Tetrahydrocannabinol NONE DETECTED NONE DETECTED   Barbiturates NONE DETECTED NONE DETECTED    Comment:        DRUG SCREEN FOR MEDICAL PURPOSES ONLY.  IF CONFIRMATION IS NEEDED FOR ANY PURPOSE, NOTIFY LAB WITHIN 5 DAYS.        LOWEST DETECTABLE LIMITS FOR URINE DRUG SCREEN Drug Class       Cutoff (ng/mL) Amphetamine      1000 Barbiturate      200 Benzodiazepine   831 Tricyclics       517 Opiates          300 Cocaine          300 THC              50   Urine microscopic-add on     Status: None   Collection Time: 05/23/14 11:48 PM  Result Value Ref Range   Squamous Epithelial / LPF RARE RARE   WBC, UA 7-10 <3 WBC/hpf   RBC / HPF 0-2 <3 RBC/hpf   Bacteria, UA RARE RARE   Labs are reviewed and are pertinent for ketone in Urine.  Current Facility-Administered Medications  Medication Dose Route  Frequency Provider Last Rate Last Dose  . efavirenz-emtricitabine-tenofovir (ATRIPLA) [phone] MG per tablet 1 tablet  1 tablet Oral QHS Everlene Balls, MD      . levETIRAcetam (KEPPRA) tablet 750 mg  750 mg Oral BID Everlene Balls, MD      . sertraline (ZOLOFT) tablet 150 mg  150 mg Oral Daily Everlene Balls, MD        Current Outpatient Prescriptions  Medication Sig Dispense Refill  . efavirenz-emtricitabine-tenofovir (ATRIPLA) [phone] MG per tablet Take 1 tablet by mouth at bedtime. 30 tablet 5  . levETIRAcetam (KEPPRA) 750 MG tablet Take 1 tablet (750 mg total) by mouth 2 (two) times daily. 60 tablet 5  . Multiple Vitamin (MULTIVITAMIN) tablet Take 1 tablet by mouth daily.      . sertraline (ZOLOFT) 50 MG tablet Take 3 tablets (150 mg total) by mouth daily. 90 tablet 12    Psychiatric Specialty Exam: Physical Exam Full physical performed in Emergency Department. I have reviewed this assessment and concur with its findings.   ROS poor cognition and poor historian due to dementia and dehydration  Blood pressure 114/79, pulse 84, temperature 98.3 F (36.8 C), temperature source Oral, resp. rate 18, SpO2 98 %.There is no weight on file to calculate BMI.  General Appearance: Bizarre and Disheveled  Eye Contact::  Good  Speech:  Blocked and Slow  Volume:  Decreased  Mood:  Anxious  Affect:  Appropriate and Constricted  Thought Process:  Loose  Orientation:  Negative  Thought Content:  NA  Suicidal Thoughts:  No  Homicidal Thoughts:  No  Memory:  Negative Immediate;   Fair Recent;   Poor  Judgement:  Poor  Insight:  Lacking  Psychomotor Activity:  Decreased  Concentration:  Poor  Recall:  Poor  Fund of Knowledge:Poor  Language: Fair  Akathisia:  NA  Handed:  Right  AIMS (if indicated):     Assets:  Desire for Improvement Housing Intimacy Leisure Time  Sleep:      Musculoskeletal: Strength & Muscle Tone: decreased Gait & Station: unable to stand Patient leans: N/A  Treatment Plan Summary: Daily contact with patient to assess and evaluate symptoms and progress in treatment Medication management  Jaquavious Mercer,JANARDHAHA R. 05/24/2014 10:05 AM

## 2014-05-24 NOTE — ED Notes (Signed)
Pt wanded and cleared by security.  

## 2014-05-24 NOTE — BH Assessment (Addendum)
Tele Assessment Note   [name] [name] is a66 y.o. female who voluntarily presents to [facility] with worsening hallucinations.  Pt was brought in by family, stating that pt has not been acting like herself for the last few days, she appears to be hallucinating and the hallucinations are worse at night. Upon arrival at the [facility], pt alert and oriented to person and place but disoriented to time which her baseline.  This Probation officer attempted to interview pt, however she appeared preoccupied and barely audible, when asked what year it is, pt replied 1995, then 1999.  The following is collateral and hx is provided by the family: pt has been arguing with someone who is not there, she's been on the front porch, yelling and cars were going by and talking to the house as if there was somebody there.  Per hx, pt has a hx of seizures and was recently admitted for altered mental status after a seizure.  Pt also has a hx of HIV and dementia.         Axis I: Unspecified mental disorder due to another medical condition Axis II: Deferred Axis III:  Past Medical History  Diagnosis Date  . HIV infection   . Substance abuse   . H/O head injury   . Seizures   . Dementia   . Depression    Axis IV: other psychosocial or environmental problems, problems related to social environment and problems with primary support group Axis V: 31-40 impairment in reality testing  Past Medical History:  Past Medical History  Diagnosis Date  . HIV infection   . Substance abuse   . H/O head injury   . Seizures   . Dementia   . Depression     Past Surgical History  Procedure Laterality Date  . Unremarkable      Family History:  Family History  Problem Relation Age of Onset  . Colon cancer Neg Hx   . Diabetes Mother   . Behavior problems Mother   . Headache Mother     Social History:  reports that she has quit smoking. She has never used smokeless tobacco. She reports that she uses illicit drugs about 7 times per  week. She reports that she does not drink alcohol.  Additional Social History:     CIWA: CIWA-Ar BP: 122/78 mmHg Pulse Rate: 71 COWS:    PATIENT STRENGTHS: (choose at least two) Supportive family/friends  Allergies: No Known Allergies  Home Medications:  (Not in a hospital admission)  OB/GYN Status:  No LMP recorded. Patient is postmenopausal.              Risk to self with the past 6 months Is patient at risk for suicide?: No                                     Advance Directives (For Healthcare) Does patient have an advance directive?: No Would patient like information on creating an advanced directive?: No - patient declined information          Disposition:     Girtha Rm 05/24/2014 2:14 AM

## 2014-05-24 NOTE — ED Notes (Signed)
PT FAMILY HAS ARRIVED TO VISIT. [place] [facility]

## 2014-05-24 NOTE — ED Notes (Signed)
Pt escorted to pod c, report given to Charlaine Dalton, RN

## 2014-05-28 LAB — URINE CULTURE: Colony Count: 1000

## 2014-05-29 ENCOUNTER — Telehealth (HOSPITAL_BASED_OUTPATIENT_CLINIC_OR_DEPARTMENT_OTHER): Payer: Self-pay | Admitting: Emergency Medicine

## 2014-05-29 NOTE — Telephone Encounter (Signed)
Post ED Visit - Positive Culture Follow-up  Culture report reviewed by antimicrobial stewardship pharmacist: []  Wes Dulaney, Pharm.D., BCPS [x]  Heide Guile, Pharm.D., BCPS []  Alycia Rossetti, Pharm.D., BCPS []  [place], Pharm.D., BCPS, AAHIVP []  Legrand Como, Pharm.D., BCPS, AAHIVP []  Isac Sarna, Pharm.D., BCPS  Positive urine culture Staph Treated with cephalexin, organism sensitive to the same and no further patient follow-up is required at this time.  Hazle Nordmann 05/29/2014, 2:10 PM

## 2014-06-13 ENCOUNTER — Ambulatory Visit: Payer: Medicaid Other | Admitting: Internal Medicine

## 2014-09-05 ENCOUNTER — Encounter: Payer: Self-pay | Admitting: Diagnostic Neuroimaging

## 2014-09-05 ENCOUNTER — Ambulatory Visit (INDEPENDENT_AMBULATORY_CARE_PROVIDER_SITE_OTHER): Payer: Medicaid Other | Admitting: Diagnostic Neuroimaging

## 2014-09-05 VITALS — BP 107/71 | HR 77 | Ht 59.0 in | Wt 89.6 lb

## 2014-09-05 DIAGNOSIS — G40109 Localization-related (focal) (partial) symptomatic epilepsy and epileptic syndromes with simple partial seizures, not intractable, without status epilepticus: Secondary | ICD-10-CM

## 2014-09-05 DIAGNOSIS — B20 Human immunodeficiency virus [HIV] disease: Secondary | ICD-10-CM | POA: Diagnosis not present

## 2014-09-05 DIAGNOSIS — F0280 Dementia in other diseases classified elsewhere without behavioral disturbance: Secondary | ICD-10-CM

## 2014-09-05 NOTE — Progress Notes (Signed)
[facility]  PATIENT: [name] [name] DOB: 1955/05/14  REFERRING CLINICIAN:  HISTORY FROM: patient and home health aid REASON FOR VISIT: follow up visit   HISTORICAL  CHIEF COMPLAINT:  No chief complaint on file.   HISTORY OF PRESENT ILLNESS:   UPDATE 09/05/14: Per caregiver, only 1 seizure (staring spell) in Nov 2015, but pt didn't go to hospital. Had 1 event of hallucinations in setting of UTI. Pt still living at home, with 57yo mother upstairs. [facility] aids come to help.   UPDATE 08/30/13: Since last visit, she may have had 2 seizures last month, but patient doesn't remember. This is reported second hand. Patient's 16 year old mother with dementia, thinks she heard a sound last month from the upstairs apartment and thinks patient may have had a seizure. However this was not corroborated by first hand account or by any other witnesses. Patient's home health aide has never witnessed any seizures.  UPDATE 06/13/12 (CM): patient returns for folowup with her caregiver. Last seizure over a year ago. Patient claims she stopped using crack cocaine in December 2013. Patient does not drive. She needs refills on her Keppra. She claims she has not missed doses of her meds.   PRIOR HPI (Dr. Mendel Corning): Ms. Signer, 59 yr old returns for follow-up. She was last seen 06/14/11. She was evaluated by Dr. Doy Mince 10/01/09 for "Seizures" which caregiver has seen increase in frequency over 2 years- occur at least 2/wk- starts w/ becoming less responsive, R hand clenches, grits teeth, will fall if standing- altered for about 18min, then normal- unaware of event- no assoc w/ activity or time of day- no shaking all over- events stereotypical- has been on Dilantin in past, placed on Keppra by Dr. Doy Mince. No further seizure activity.  Care giver who stays 3 hrs M-Friday and gives her meds and makes sure she has food, etc.   H/o CHI in past- no known FH sz- active cocaine  use-   REVIEW OF SYSTEMS: Full 14 system review of systems performed and notable only for seizure hallucinations depression.   ALLERGIES: No Known Allergies  HOME MEDICATIONS: Outpatient Prescriptions Prior to Visit  Medication Sig Dispense Refill  . efavirenz-emtricitabine-tenofovir (ATRIPLA) [phone] MG per tablet Take 1 tablet by mouth at bedtime. 30 tablet 5  . levETIRAcetam (KEPPRA) 750 MG tablet Take 1 tablet (750 mg total) by mouth 2 (two) times daily. 60 tablet 5  . Multiple Vitamin (MULTIVITAMIN) tablet Take 1 tablet by mouth daily.      . sertraline (ZOLOFT) 50 MG tablet Take 3 tablets (150 mg total) by mouth daily. 90 tablet 12  . cephALEXin (KEFLEX) 500 MG capsule Take 1 capsule (500 mg total) by mouth 4 (four) times daily. 28 capsule 0   No facility-administered medications prior to visit.    PAST MEDICAL HISTORY: Past Medical History  Diagnosis Date  . HIV infection   . Substance abuse   . H/O head injury   . Seizures   . Dementia   . Depression     PAST SURGICAL HISTORY: Past Surgical History  Procedure Laterality Date  . Unremarkable      FAMILY HISTORY: Family History  Problem Relation Age of Onset  . Colon cancer Neg Hx   . Diabetes Mother   . Behavior problems Mother   . Headache Mother     SOCIAL HISTORY:  History   Social History  . Marital Status: Single    Spouse Name: N/A  . Number  of Children: 1  . Years of Education: N/A   Occupational History  .     Social History Main Topics  . Smoking status: Former Research scientist (life sciences)  . Smokeless tobacco: Never Used  . Alcohol Use: No  . Drug Use: 7.00 per week     Comment: crack  . Sexual Activity:    Partners: Male     Comment: pt. given condoms   Other Topics Concern  . Not on file   Social History Narrative   Patient lives at home with her mother.   Caffeine Use: 0     PHYSICAL EXAM  Filed Vitals:   09/05/14 1403  BP: 107/71  Pulse: 77  Height: 4\' 11"  (1.499 m)  Weight: 89  lb 9.6 oz (40.642 kg)    Not recorded      Body mass index is 18.09 kg/(m^2).  GENERAL EXAM: Patient is in no distress; well developed, nourished and groomed; neck is supple; FRAIL APPEARING. APPEARS OLDER THAN STATED AGE.  CARDIOVASCULAR: Regular rate and rhythm, no murmurs, no carotid bruits  NEUROLOGIC: MENTAL STATUS: awake; MINIMAL SPEECH; FOLLOWS SIMPLE COMMANDS CRANIAL NERVE: pupils equal and reactive to light, visual fields full to confrontation, extraocular muscles intact, ENDGAZE NYSTAGMUS, facial sensation and strength symmetric, hearing intact, palate elevates symmetrically, uvula midline, shoulder shrug symmetric, tongue midline. MOTOR: normal bulk and tone, full strength in the BUE, BLE SENSORY: normal and symmetric to light touch COORDINATION: finger-nose-finger normal REFLEXES: deep tendon reflexes present and symmetric GAIT/STATION: narrow based gait     DIAGNOSTIC DATA (LABS, IMAGING, TESTING) - I reviewed patient records, labs, notes, testing and imaging myself where available.  Lab Results  Component Value Date   WBC 9.3 05/23/2014   HGB 12.9 05/23/2014   HCT 38.5 05/23/2014   MCV 82.8 05/23/2014   PLT 145* 05/23/2014      Component Value Date/Time   NA 142 05/23/2014 1801   K 4.0 05/23/2014 1801   CL 104 05/23/2014 1801   CO2 25 05/23/2014 1801   GLUCOSE 111* 05/23/2014 1801   BUN 10 05/23/2014 1801   CREATININE 0.98 05/23/2014 1801   CREATININE 0.79 02/05/2014 1442   CALCIUM 9.4 05/23/2014 1801   PROT 8.5* 05/23/2014 1801   ALBUMIN 4.4 05/23/2014 1801   AST 73* 05/23/2014 1801   ALT 40* 05/23/2014 1801   ALKPHOS 66 05/23/2014 1801   BILITOT 0.8 05/23/2014 1801   GFRNONAA 63* 05/23/2014 1801   GFRNONAA 84 02/05/2014 1442   GFRAA 73* 05/23/2014 1801   GFRAA >89 02/05/2014 1442   Lab Results  Component Value Date   CHOL 232* 02/05/2014   HDL 43 02/05/2014   LDLCALC 146* 02/05/2014   TRIG 215* 02/05/2014   CHOLHDL 5.4 02/05/2014   No  results found for: HGBA1C No results found for: VITAMINB12 No results found for: TSH  10/08/09 EEG - intermittent slowing of the left fronto-temporal area; no epileptiform discharges  10/16/09 MRI brain - mild generalized atrophy which is age inappropriate. No structural lesions are noted.   ASSESSMENT AND PLAN  59 y.o. 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 Had been seizure from for almost 2 years, and then 2 possible events last month, although not directly witnessed.  Dx: complex partial seizures + dementia (? HIV associated) + history of crack cocaine abuse + history of traumatic brain injury + depression/anxiety  PLAN: - continue current medications - had long discussion about long term living arrangements; I think patient would benefit  from social work assessment - will setup social work consultation; palliative care consult also recommended  Return in about 1 year (around 09/05/2015).    Penni Bombard, MD 2/50/0370, 4:88 PM Certified in Neurology, Neurophysiology and Neuroimaging  [facility] [address] [place], [place] [address] [phone]

## 2014-09-05 NOTE — Patient Instructions (Addendum)
Continue current medications.   Will setup social work consultation.

## 2014-09-06 ENCOUNTER — Other Ambulatory Visit: Payer: Self-pay

## 2014-09-06 MED ORDER — LEVETIRACETAM 750 MG PO TABS: 750.0000 mg | ORAL_TABLET | Freq: Two times a day (BID) | ORAL | Status: DC

## 2014-10-04 ENCOUNTER — Other Ambulatory Visit: Payer: Self-pay | Admitting: *Deleted

## 2014-10-04 DIAGNOSIS — B20 Human immunodeficiency virus [HIV] disease: Secondary | ICD-10-CM

## 2014-10-04 MED ORDER — EFAVIRENZ-EMTRICITAB-TENOFOVIR 600-200-300 MG PO TABS: 1.0000 | ORAL_TABLET | Freq: Every day | ORAL | Status: DC

## 2014-10-14 ENCOUNTER — Encounter: Payer: Self-pay | Admitting: Internal Medicine

## 2014-10-14 ENCOUNTER — Ambulatory Visit (INDEPENDENT_AMBULATORY_CARE_PROVIDER_SITE_OTHER): Payer: Medicaid Other | Admitting: Internal Medicine

## 2014-10-14 ENCOUNTER — Ambulatory Visit
Admission: RE | Admit: 2014-10-14 | Discharge: 2014-10-14 | Disposition: A | Discharge status: Home / Self Care | Payer: Medicaid Other | Source: Ambulatory Visit | Attending: Internal Medicine | Admitting: Internal Medicine

## 2014-10-14 ENCOUNTER — Other Ambulatory Visit: Payer: Self-pay | Admitting: Internal Medicine

## 2014-10-14 ENCOUNTER — Other Ambulatory Visit (HOSPITAL_COMMUNITY)
Admission: RE | Admit: 2014-10-14 | Discharge: 2014-10-14 | Disposition: A | Discharge status: Home / Self Care | Payer: Medicaid Other | Source: Ambulatory Visit | Attending: Internal Medicine | Admitting: Internal Medicine

## 2014-10-14 VITALS — BP 102/68 | HR 76 | Temp 98.3°F | Ht 59.0 in | Wt 84.0 lb

## 2014-10-14 DIAGNOSIS — Z113 Encounter for screening for infections with a predominantly sexual mode of transmission: Secondary | ICD-10-CM | POA: Insufficient documentation

## 2014-10-14 DIAGNOSIS — B20 Human immunodeficiency virus [HIV] disease: Secondary | ICD-10-CM | POA: Diagnosis not present

## 2014-10-14 DIAGNOSIS — R634 Abnormal weight loss: Secondary | ICD-10-CM

## 2014-10-14 DIAGNOSIS — R636 Underweight: Secondary | ICD-10-CM | POA: Diagnosis present

## 2014-10-14 DIAGNOSIS — Z79899 Other long term (current) drug therapy: Secondary | ICD-10-CM | POA: Diagnosis not present

## 2014-10-14 NOTE — Assessment & Plan Note (Signed)
It may just be from poor diet but I will check LDH and CXR to be sure no other signs such as for cancer.

## 2014-10-14 NOTE — Progress Notes (Signed)
  Subjective:    Patient ID: [name] [name], female    DOB: April 01, 1956, 59 y.o.   MRN: [id number]  HPI She comes in for followup of her HIV. She has been on Atripla and denies any missed doses. She is given her medications by her mother. She tells me she lives with her mother now. She is here with her caretaker.  She has had a 16 lb weight los.     Review of Systems  Constitutional: Negative for fatigue and unexpected weight change.  HENT: Negative for ear discharge, sore throat and trouble swallowing.   Eyes: Negative for photophobia, pain, discharge, redness, itching and visual disturbance.  Respiratory: Negative for cough and shortness of breath.   Cardiovascular: Negative for chest pain.  Gastrointestinal: Negative for nausea and diarrhea.  Musculoskeletal: Negative for myalgias and arthralgias.  Skin: Negative for rash.  Neurological: Negative for dizziness and headaches.  Hematological: Negative for adenopathy.  Psychiatric/Behavioral: Negative for dysphoric mood.       Objective:   Physical Exam  Constitutional: She appears well-developed and well-nourished. No distress.  HENT:  Mouth/Throat: No oropharyngeal exudate.  Eyes: Conjunctivae are normal. Right eye exhibits no discharge. Left eye exhibits no discharge. No scleral icterus.  Cardiovascular: Normal rate, regular rhythm and normal heart sounds.   No murmur heard. Pulmonary/Chest: Effort normal and breath sounds normal. No respiratory distress. She has no wheezes.  Lymphadenopathy:    She has no cervical adenopathy.    She has no axillary adenopathy.       Right: No supraclavicular adenopathy present.       Left: No supraclavicular adenopathy present.  Skin: No rash noted.          Assessment & Plan:

## 2014-10-14 NOTE — Assessment & Plan Note (Signed)
Labs today and she can rtc in about 3 months.

## 2014-10-15 LAB — LIPID PANEL
CHOL/HDL RATIO: 4.5 ratio
Cholesterol: 208 mg/dL — ABNORMAL HIGH (ref 0–200)
HDL: 46 mg/dL (ref >=46)
LDL Cholesterol: 112 mg/dL — ABNORMAL HIGH (ref 0–99)
TRIGLYCERIDES: 252 mg/dL — AB (ref <150)
VLDL: 50 mg/dL — ABNORMAL HIGH (ref 0–40)

## 2014-10-15 LAB — LACTATE DEHYDROGENASE: LDH: 194 U/L (ref 94–250)

## 2014-10-15 LAB — CBC WITH DIFFERENTIAL/PLATELET
Basophils Absolute: 0.1 10*3/uL (ref 0.0–0.1)
Basophils Relative: 1 % (ref 0–1)
EOS PCT: 2 % (ref 0–5)
Eosinophils Absolute: 0.1 10*3/uL (ref 0.0–0.7)
HCT: 37.4 % (ref 36.0–46.0)
Hemoglobin: 12.7 g/dL (ref 12.0–15.0)
LYMPHS PCT: 36 % (ref 12–46)
Lymphs Abs: 2 10*3/uL (ref 0.7–4.0)
MCH: 27.2 pg (ref 26.0–34.0)
MCHC: 34 g/dL (ref 30.0–36.0)
MCV: 80.1 fL (ref 78.0–100.0)
MONOS PCT: 6 % (ref 3–12)
MPV: 9.6 fL (ref 8.6–12.4)
Monocytes Absolute: 0.3 10*3/uL (ref 0.1–1.0)
NEUTROS ABS: 3 10*3/uL (ref 1.7–7.7)
Neutrophils Relative %: 55 % (ref 43–77)
Platelets: 161 10*3/uL (ref 150–400)
RBC: 4.67 MIL/uL (ref 3.87–5.11)
RDW: 15.3 % (ref 11.5–15.5)
WBC: 5.5 10*3/uL (ref 4.0–10.5)

## 2014-10-15 LAB — COMPLETE METABOLIC PANEL WITH GFR
ALBUMIN: 3.9 g/dL (ref 3.5–5.2)
ALT: 33 U/L (ref 0–35)
AST: 54 U/L — ABNORMAL HIGH (ref 0–37)
Alkaline Phosphatase: 64 U/L (ref 39–117)
BILIRUBIN TOTAL: 0.3 mg/dL (ref 0.2–1.2)
BUN: 12 mg/dL (ref 6–23)
CO2: 26 mEq/L (ref 19–32)
Calcium: 9.1 mg/dL (ref 8.4–10.5)
Chloride: 105 mEq/L (ref 96–112)
Creat: 0.86 mg/dL (ref 0.50–1.10)
GFR, Est African American: 87 mL/min
GFR, Est Non African American: 76 mL/min
Glucose, Bld: 86 mg/dL (ref 70–99)
Potassium: 3.8 mEq/L (ref 3.5–5.3)
Sodium: 137 mEq/L (ref 135–145)
Total Protein: 7.8 g/dL (ref 6.0–8.3)

## 2014-10-15 LAB — RPR: RPR Ser Ql: REACTIVE — AB

## 2014-10-15 LAB — HEPATITIS C ANTIBODY: HCV Ab: REACTIVE — AB

## 2014-10-15 LAB — RPR TITER: RPR Titer: 1:2 {titer}

## 2014-10-15 LAB — FLUORESCENT TREPONEMAL AB(FTA)-IGG-BLD: Fluorescent Treponemal ABS: REACTIVE — AB

## 2014-10-16 LAB — URINE CYTOLOGY ANCILLARY ONLY
Chlamydia: NEGATIVE
NEISSERIA GONORRHEA: NEGATIVE

## 2014-10-16 LAB — HIV-1 RNA QUANT-NO REFLEX-BLD: HIV-1 RNA Quant, Log: <1.3 {Log} (ref <1.30)

## 2014-10-16 LAB — T-HELPER CELL (CD4) - (RCID CLINIC ONLY)
CD4 % Helper T Cell: 30 % — ABNORMAL LOW (ref 33–55)
CD4 T CELL ABS: 590 /uL (ref 400–2700)

## 2014-10-18 LAB — PROTEIN ELECTROPHORESIS, SERUM, WITH REFLEX
Albumin ELP: 4 g/dL (ref 3.8–4.8)
Alpha-1-Globulin: 0.3 g/dL (ref 0.2–0.3)
Alpha-2-Globulin: 0.8 g/dL (ref 0.5–0.9)
BETA 2: 0.3 g/dL (ref 0.2–0.5)
Beta Globulin: 0.5 g/dL (ref 0.4–0.6)
GAMMA GLOBULIN: 2 g/dL — AB (ref 0.8–1.7)
Total Protein, Serum Electrophoresis: 7.8 g/dL (ref 6.1–8.1)

## 2014-10-18 LAB — IGG, IGA, IGM
IgA: 215 mg/dL (ref 69–380)
IgG (Immunoglobin G), Serum: 2400 mg/dL — ABNORMAL HIGH (ref 690–1700)
IgM, Serum: 238 mg/dL (ref 52–322)

## 2014-10-18 LAB — HEPATITIS C RNA QUANTITATIVE
HCV QUANT LOG: 6.23 {Log} — AB (ref <1.18)
HCV Quantitative: 1710870 IU/mL — ABNORMAL HIGH (ref <15)

## 2014-10-18 LAB — IFE INTERPRETATION

## 2014-10-20 ENCOUNTER — Other Ambulatory Visit: Payer: Self-pay

## 2014-10-20 MED ORDER — SERTRALINE HCL 50 MG PO TABS: 150.0000 mg | ORAL_TABLET | Freq: Every day | ORAL | Status: DC

## 2014-11-08 ENCOUNTER — Encounter (HOSPITAL_COMMUNITY): Payer: Self-pay | Admitting: Emergency Medicine

## 2014-11-08 ENCOUNTER — Emergency Department (HOSPITAL_COMMUNITY): Payer: Medicaid Other

## 2014-11-08 ENCOUNTER — Inpatient Hospital Stay (HOSPITAL_COMMUNITY)
Admission: EM | Admit: 2014-11-08 | Discharge: 2014-11-11 | DRG: 070 | Disposition: A | Discharge status: Home / Self Care | Payer: Medicaid Other | Attending: Internal Medicine | Admitting: Internal Medicine

## 2014-11-08 DIAGNOSIS — Z87891 Personal history of nicotine dependence: Secondary | ICD-10-CM

## 2014-11-08 DIAGNOSIS — N390 Urinary tract infection, site not specified: Secondary | ICD-10-CM | POA: Diagnosis present

## 2014-11-08 DIAGNOSIS — B20 Human immunodeficiency virus [HIV] disease: Secondary | ICD-10-CM | POA: Insufficient documentation

## 2014-11-08 DIAGNOSIS — D638 Anemia in other chronic diseases classified elsewhere: Secondary | ICD-10-CM | POA: Diagnosis present

## 2014-11-08 DIAGNOSIS — G9340 Encephalopathy, unspecified: Principal | ICD-10-CM | POA: Diagnosis present

## 2014-11-08 DIAGNOSIS — D696 Thrombocytopenia, unspecified: Secondary | ICD-10-CM | POA: Insufficient documentation

## 2014-11-08 DIAGNOSIS — Z681 Body mass index (BMI) 19 or less, adult: Secondary | ICD-10-CM

## 2014-11-08 DIAGNOSIS — G40909 Epilepsy, unspecified, not intractable, without status epilepticus: Secondary | ICD-10-CM | POA: Diagnosis present

## 2014-11-08 DIAGNOSIS — Z833 Family history of diabetes mellitus: Secondary | ICD-10-CM

## 2014-11-08 DIAGNOSIS — F329 Major depressive disorder, single episode, unspecified: Secondary | ICD-10-CM | POA: Diagnosis present

## 2014-11-08 DIAGNOSIS — D649 Anemia, unspecified: Secondary | ICD-10-CM | POA: Insufficient documentation

## 2014-11-08 DIAGNOSIS — Z21 Asymptomatic human immunodeficiency virus [HIV] infection status: Secondary | ICD-10-CM | POA: Diagnosis present

## 2014-11-08 DIAGNOSIS — Z781 Physical restraint status: Secondary | ICD-10-CM

## 2014-11-08 DIAGNOSIS — F0390 Unspecified dementia without behavioral disturbance: Secondary | ICD-10-CM | POA: Diagnosis present

## 2014-11-08 DIAGNOSIS — R4182 Altered mental status, unspecified: Secondary | ICD-10-CM

## 2014-11-08 DIAGNOSIS — E43 Unspecified severe protein-calorie malnutrition: Secondary | ICD-10-CM | POA: Diagnosis present

## 2014-11-08 DIAGNOSIS — D6959 Other secondary thrombocytopenia: Secondary | ICD-10-CM | POA: Diagnosis present

## 2014-11-08 DIAGNOSIS — Z79899 Other long term (current) drug therapy: Secondary | ICD-10-CM

## 2014-11-08 NOTE — ED Notes (Signed)
Pt arrived to the ED via EMS with a complaint of altered mental status.  Pt lives in a pseudo group home environment with her Godmother and a CNA.  Pt was said to remove her clothes around 1700 hrs today and laid on the floor.  Pt has a hx of seizures but pt's caregivers and family states they observed no seizure activity.  Pt is said to have similar symptoms previously when she had a UTI.  Pt is poor historian and not particularly answering questions.  Per EMS pt can ambulate.

## 2014-11-08 NOTE — ED Provider Notes (Signed)
CSN: [id number]     Arrival date & time 11/08/14  2211 History   First MD Initiated Contact with Patient 11/08/14 2305     Chief Complaint  Patient presents with  . Altered Mental Status     (Consider location/radiation/quality/duration/timing/severity/associated sxs/prior Treatment) HPI Patient is not responding to questions. Nonverbal at this time. Level V caveat applies. Per EMS patient lives in a group home type environment with godmother. Found in the floor after having moved all of her clothes this evening. Per EMS patient is normally alert and oriented. Past Medical History  Diagnosis Date  . HIV infection   . Substance abuse   . H/O head injury   . Seizures   . Dementia   . Depression    Past Surgical History  Procedure Laterality Date  . Unremarkable     Family History  Problem Relation Age of Onset  . Colon cancer Neg Hx   . Diabetes Mother   . Behavior problems Mother   . Headache Mother    History  Substance Use Topics  . Smoking status: Former Research scientist (life sciences)  . Smokeless tobacco: Never Used  . Alcohol Use: No   OB History    No data available     Review of Systems  Unable to perform ROS: Mental status change      Allergies  Review of patient's allergies indicates no known allergies.  Home Medications   Prior to Admission medications   Medication Sig Start Date End Date Taking? Authorizing Provider  efavirenz-emtricitabine-tenofovir (ATRIPLA) [phone] MG per tablet Take 1 tablet by mouth at bedtime. 10/04/14  Yes Thayer Headings, MD  levETIRAcetam (KEPPRA) 750 MG tablet Take 1 tablet (750 mg total) by mouth 2 (two) times daily. 09/06/14  Yes Penni Bombard, MD  Multiple Vitamin (MULTIVITAMIN) tablet Take 1 tablet by mouth daily.     Yes Historical Provider, MD  sertraline (ZOLOFT) 50 MG tablet Take 3 tablets (150 mg total) by mouth daily. 10/20/14  Yes Penni Bombard, MD  feeding supplement, ENSURE ENLIVE, (ENSURE ENLIVE) LIQD Take 237 mLs by mouth  2 (two) times daily between meals. 11/11/14   Robbie Lis, MD  LORazepam (ATIVAN) 1 MG tablet Take 1 tablet (1 mg total) by mouth every 8 (eight) hours as needed for anxiety. 11/11/14   Robbie Lis, MD  nitrofurantoin, macrocrystal-monohydrate, (MACROBID) 100 MG capsule Take 1 capsule (100 mg total) by mouth 2 (two) times daily. 11/11/14   Robbie Lis, MD   BP 110/65 mmHg  Pulse 111  Temp(Src) 97.6 F (36.4 C) (Axillary)  Resp 14  Ht 4\' 11"  (1.499 m)  Wt 82 lb 0.2 oz (37.2 kg)  SpO2 100% Physical Exam  Constitutional: She appears well-developed and well-nourished. No distress.  Mildly cachectic  HENT:  Head: Normocephalic and atraumatic.  Mouth/Throat: Oropharynx is clear and moist. No oropharyngeal exudate.  Eyes: EOM are normal. Pupils are equal, round, and reactive to light.  Neck: Normal range of motion. Neck supple.  No posterior midline cervical tenderness to palpation. No meningismus.  Cardiovascular: Normal rate and regular rhythm.   Pulmonary/Chest: Effort normal and breath sounds normal. No respiratory distress. She has no wheezes. She has no rales.  Abdominal: Soft. Bowel sounds are normal. She exhibits no distension and no mass. There is no tenderness. There is no rebound and no guarding.  Musculoskeletal: Normal range of motion. She exhibits no edema or tenderness.  Neurological: She is alert.  Appears to be moving  all extremities. Following some commands.   Skin: Skin is warm and dry. No rash noted. No erythema.  Nursing note and vitals reviewed.   ED Course  Procedures (including critical care time) Labs Review Labs Reviewed  CBC WITH DIFFERENTIAL/PLATELET - Abnormal; Notable for the following:    Hemoglobin 11.3 (*)    HCT 35.4 (*)    Platelets 140 (*)    All other components within normal limits  COMPREHENSIVE METABOLIC PANEL - Abnormal; Notable for the following:    Glucose, Bld 123 (*)    Calcium 8.7 (*)    AST 57 (*)    All other components within  normal limits  URINALYSIS, ROUTINE W REFLEX MICROSCOPIC (NOT AT [facility]) - Abnormal; Notable for the following:    Leukocytes, UA LARGE (*)    All other components within normal limits  URINE MICROSCOPIC-ADD ON - Abnormal; Notable for the following:    Bacteria, UA FEW (*)    All other components within normal limits  COMPREHENSIVE METABOLIC PANEL - Abnormal; Notable for the following:    Glucose, Bld 110 (*)    Calcium 8.5 (*)    Total Protein 8.3 (*)    AST 55 (*)    All other components within normal limits  APTT - Abnormal; Notable for the following:    aPTT 20 (*)    All other components within normal limits  COMPREHENSIVE METABOLIC PANEL - Abnormal; Notable for the following:    Calcium 8.4 (*)    Total Protein 8.6 (*)    AST 57 (*)    All other components within normal limits  CBC - Abnormal; Notable for the following:    Platelets 132 (*)    All other components within normal limits  URINE CULTURE  URINE RAPID DRUG SCREEN, HOSP PERFORMED  ETHANOL  MAGNESIUM  CBC WITH DIFFERENTIAL/PLATELET  PROTIME-INR  TSH  GLUCOSE, CAPILLARY  GLUCOSE, CAPILLARY  HEMOGLOBIN A1C    Imaging Review No results found.   EKG Interpretation None      MDM   Final diagnoses:  Altered mental status, unspecified altered mental status type    Questional urinary tract infection. Given dose of IV Rocephin in the emergency department. Discuss with hospitalist evaluate the patient. Please patient does not medically necessitate admission. Patient observed in the emergency department for greater than 9 hours. Continues to be minimally verbal and not at her baseline. CT head without any acute findings. Will ask psychiatry to evaluate for recommendations.  Psychiatry attempted to interview the patient but the patient remained nonverbal. Discussed with [facility] hospitalist and will admit for altered mental status.  Julianne Rice, MD 11/12/14 [phone]

## 2014-11-08 NOTE — ED Notes (Signed)
Bed: ZQ94 Expected date:  Expected time:  Means of arrival:  Comments: EMS 49F AMS

## 2014-11-09 DIAGNOSIS — B20 Human immunodeficiency virus [HIV] disease: Secondary | ICD-10-CM | POA: Diagnosis not present

## 2014-11-09 DIAGNOSIS — Z79899 Other long term (current) drug therapy: Secondary | ICD-10-CM | POA: Diagnosis not present

## 2014-11-09 DIAGNOSIS — N390 Urinary tract infection, site not specified: Secondary | ICD-10-CM | POA: Diagnosis not present

## 2014-11-09 DIAGNOSIS — G9340 Encephalopathy, unspecified: Secondary | ICD-10-CM | POA: Diagnosis not present

## 2014-11-09 DIAGNOSIS — D638 Anemia in other chronic diseases classified elsewhere: Secondary | ICD-10-CM | POA: Diagnosis not present

## 2014-11-09 DIAGNOSIS — R410 Disorientation, unspecified: Secondary | ICD-10-CM | POA: Diagnosis not present

## 2014-11-09 DIAGNOSIS — R4182 Altered mental status, unspecified: Secondary | ICD-10-CM | POA: Diagnosis not present

## 2014-11-09 DIAGNOSIS — D696 Thrombocytopenia, unspecified: Secondary | ICD-10-CM | POA: Insufficient documentation

## 2014-11-09 DIAGNOSIS — Z681 Body mass index (BMI) 19 or less, adult: Secondary | ICD-10-CM | POA: Diagnosis not present

## 2014-11-09 DIAGNOSIS — Z21 Asymptomatic human immunodeficiency virus [HIV] infection status: Secondary | ICD-10-CM | POA: Diagnosis present

## 2014-11-09 DIAGNOSIS — F0390 Unspecified dementia without behavioral disturbance: Secondary | ICD-10-CM | POA: Diagnosis present

## 2014-11-09 DIAGNOSIS — Z833 Family history of diabetes mellitus: Secondary | ICD-10-CM | POA: Diagnosis not present

## 2014-11-09 DIAGNOSIS — F329 Major depressive disorder, single episode, unspecified: Secondary | ICD-10-CM | POA: Diagnosis present

## 2014-11-09 DIAGNOSIS — D649 Anemia, unspecified: Secondary | ICD-10-CM | POA: Insufficient documentation

## 2014-11-09 DIAGNOSIS — Z781 Physical restraint status: Secondary | ICD-10-CM | POA: Diagnosis not present

## 2014-11-09 DIAGNOSIS — E43 Unspecified severe protein-calorie malnutrition: Secondary | ICD-10-CM | POA: Diagnosis present

## 2014-11-09 DIAGNOSIS — Z87891 Personal history of nicotine dependence: Secondary | ICD-10-CM | POA: Diagnosis not present

## 2014-11-09 DIAGNOSIS — D6959 Other secondary thrombocytopenia: Secondary | ICD-10-CM | POA: Diagnosis present

## 2014-11-09 DIAGNOSIS — G40909 Epilepsy, unspecified, not intractable, without status epilepticus: Secondary | ICD-10-CM | POA: Diagnosis present

## 2014-11-09 LAB — COMPREHENSIVE METABOLIC PANEL
ALBUMIN: 3.9 g/dL (ref 3.5–5.0)
ALBUMIN: 3.9 g/dL (ref 3.5–5.0)
ALK PHOS: 65 U/L (ref 38–126)
ALT: 35 U/L (ref 14–54)
ALT: 35 U/L (ref 14–54)
AST: 55 U/L — ABNORMAL HIGH (ref 15–41)
AST: 57 U/L — ABNORMAL HIGH (ref 15–41)
Alkaline Phosphatase: 63 U/L (ref 38–126)
Anion gap: 12 (ref 5–15)
Anion gap: 9 (ref 5–15)
BUN: 10 mg/dL (ref 6–20)
BUN: 12 mg/dL (ref 6–20)
CALCIUM: 8.7 mg/dL — AB (ref 8.9–10.3)
CHLORIDE: 102 mmol/L (ref 101–111)
CHLORIDE: 104 mmol/L (ref 101–111)
CO2: 23 mmol/L (ref 22–32)
CO2: 26 mmol/L (ref 22–32)
Calcium: 8.5 mg/dL — ABNORMAL LOW (ref 8.9–10.3)
Creatinine, Ser: 0.71 mg/dL (ref 0.44–1.00)
Creatinine, Ser: 0.88 mg/dL (ref 0.44–1.00)
GFR calc Af Amer: >60 mL/min (ref >60)
GFR calc non Af Amer: >60 mL/min (ref >60)
GFR calc non Af Amer: >60 mL/min (ref >60)
GLUCOSE: 123 mg/dL — AB (ref 65–99)
Glucose, Bld: 110 mg/dL — ABNORMAL HIGH (ref 65–99)
Potassium: 3.5 mmol/L (ref 3.5–5.1)
Potassium: 4.2 mmol/L (ref 3.5–5.1)
Sodium: 137 mmol/L (ref 135–145)
Sodium: 139 mmol/L (ref 135–145)
Total Bilirubin: 0.4 mg/dL (ref 0.3–1.2)
Total Bilirubin: 0.4 mg/dL (ref 0.3–1.2)
Total Protein: 8.1 g/dL (ref 6.5–8.1)
Total Protein: 8.3 g/dL — ABNORMAL HIGH (ref 6.5–8.1)

## 2014-11-09 LAB — APTT: aPTT: 20 seconds — ABNORMAL LOW (ref 24–37)

## 2014-11-09 LAB — URINE MICROSCOPIC-ADD ON

## 2014-11-09 LAB — CBC WITH DIFFERENTIAL/PLATELET
BASOS PCT: 1 % (ref 0–1)
Basophils Absolute: 0 10*3/uL (ref 0.0–0.1)
Basophils Absolute: 0.1 10*3/uL (ref 0.0–0.1)
Basophils Relative: 1 % (ref 0–1)
EOS ABS: 0.1 10*3/uL (ref 0.0–0.7)
Eosinophils Absolute: 0.1 10*3/uL (ref 0.0–0.7)
Eosinophils Relative: 2 % (ref 0–5)
Eosinophils Relative: 2 % (ref 0–5)
HCT: 35.4 % — ABNORMAL LOW (ref 36.0–46.0)
HCT: 37.8 % (ref 36.0–46.0)
Hemoglobin: 11.3 g/dL — ABNORMAL LOW (ref 12.0–15.0)
Hemoglobin: 12.2 g/dL (ref 12.0–15.0)
LYMPHS PCT: 30 % (ref 12–46)
Lymphocytes Relative: 30 % (ref 12–46)
Lymphs Abs: 1.7 10*3/uL (ref 0.7–4.0)
Lymphs Abs: 1.8 10*3/uL (ref 0.7–4.0)
MCH: 26.6 pg (ref 26.0–34.0)
MCH: 26.8 pg (ref 26.0–34.0)
MCHC: 31.9 g/dL (ref 30.0–36.0)
MCHC: 32.3 g/dL (ref 30.0–36.0)
MCV: 82.9 fL (ref 78.0–100.0)
MCV: 83.3 fL (ref 78.0–100.0)
MONOS PCT: 8 % (ref 3–12)
Monocytes Absolute: 0.4 10*3/uL (ref 0.1–1.0)
Monocytes Absolute: 0.5 10*3/uL (ref 0.1–1.0)
Monocytes Relative: 7 % (ref 3–12)
NEUTROS ABS: 3.4 10*3/uL (ref 1.7–7.7)
NEUTROS PCT: 60 % (ref 43–77)
Neutro Abs: 3.7 10*3/uL (ref 1.7–7.7)
Neutrophils Relative %: 59 % (ref 43–77)
PLATELETS: 158 10*3/uL (ref 150–400)
Platelets: 140 10*3/uL — ABNORMAL LOW (ref 150–400)
RBC: 4.25 MIL/uL (ref 3.87–5.11)
RBC: 4.56 MIL/uL (ref 3.87–5.11)
RDW: 14.3 % (ref 11.5–15.5)
RDW: 14.3 % (ref 11.5–15.5)
WBC: 5.6 10*3/uL (ref 4.0–10.5)
WBC: 6.1 10*3/uL (ref 4.0–10.5)

## 2014-11-09 LAB — RAPID URINE DRUG SCREEN, HOSP PERFORMED
AMPHETAMINES: NOT DETECTED
Barbiturates: NOT DETECTED
Benzodiazepines: NOT DETECTED
COCAINE: NOT DETECTED
Opiates: NOT DETECTED
TETRAHYDROCANNABINOL: NOT DETECTED

## 2014-11-09 LAB — URINALYSIS, ROUTINE W REFLEX MICROSCOPIC
Bilirubin Urine: NEGATIVE
Glucose, UA: NEGATIVE mg/dL
Hgb urine dipstick: NEGATIVE
Ketones, ur: NEGATIVE mg/dL
Nitrite: NEGATIVE
PROTEIN: NEGATIVE mg/dL
SPECIFIC GRAVITY, URINE: 1.025 (ref 1.005–1.030)
UROBILINOGEN UA: 0.2 mg/dL (ref 0.0–1.0)
pH: 6.5 (ref 5.0–8.0)

## 2014-11-09 LAB — ETHANOL: Alcohol, Ethyl (B): <5 mg/dL (ref <5)

## 2014-11-09 LAB — MAGNESIUM: Magnesium: 1.9 mg/dL (ref 1.7–2.4)

## 2014-11-09 LAB — PROTIME-INR
INR: 1.03 (ref 0.00–1.49)
Prothrombin Time: 13.7 seconds (ref 11.6–15.2)

## 2014-11-09 LAB — TSH: TSH: 1.726 u[IU]/mL (ref 0.350–4.500)

## 2014-11-09 MED ORDER — EMTRICITABINE 200 MG PO CAPS
200.0000 mg | ORAL_CAPSULE | ORAL | Status: DC
  Administered 2014-11-10: 200 mg via ORAL
  Filled 2014-11-09: qty 1

## 2014-11-09 MED ORDER — LEVETIRACETAM 750 MG PO TABS
750.0000 mg | ORAL_TABLET | Freq: Two times a day (BID) | ORAL | Status: DC
  Administered 2014-11-09 – 2014-11-11 (×4): 750 mg via ORAL
  Filled 2014-11-09 (×5): qty 1

## 2014-11-09 MED ORDER — LORAZEPAM 2 MG/ML IJ SOLN
1.0000 mg | INTRAMUSCULAR | Status: DC | PRN
  Administered 2014-11-09 – 2014-11-10 (×3): 1 mg via INTRAVENOUS
  Filled 2014-11-09 (×3): qty 1

## 2014-11-09 MED ORDER — SODIUM CHLORIDE 0.9 % IJ SOLN
3.0000 mL | Freq: Two times a day (BID) | INTRAMUSCULAR | Status: DC
  Administered 2014-11-11: 3 mL via INTRAVENOUS

## 2014-11-09 MED ORDER — ONDANSETRON HCL 4 MG PO TABS: 4.0000 mg | ORAL_TABLET | Freq: Four times a day (QID) | ORAL | Status: DC | PRN

## 2014-11-09 MED ORDER — ADULT MULTIVITAMIN W/MINERALS CH
1.0000 | ORAL_TABLET | Freq: Every day | ORAL | Status: DC
  Administered 2014-11-10 – 2014-11-11 (×2): 1 via ORAL
  Filled 2014-11-09 (×3): qty 1

## 2014-11-09 MED ORDER — EFAVIRENZ 600 MG PO TABS
600.0000 mg | ORAL_TABLET | Freq: Every day | ORAL | Status: DC
  Administered 2014-11-09 – 2014-11-10 (×2): 600 mg via ORAL
  Filled 2014-11-09 (×2): qty 1

## 2014-11-09 MED ORDER — HALOPERIDOL LACTATE 5 MG/ML IJ SOLN: 2.0000 mg | Freq: Four times a day (QID) | INTRAMUSCULAR | Status: DC | PRN

## 2014-11-09 MED ORDER — DEXTROSE 5 % IV SOLN
1.0000 g | Freq: Once | INTRAVENOUS | Status: AC
  Administered 2014-11-09: 1 g via INTRAVENOUS
  Filled 2014-11-09: qty 10

## 2014-11-09 MED ORDER — SERTRALINE HCL 50 MG PO TABS
150.0000 mg | ORAL_TABLET | Freq: Every day | ORAL | Status: DC
  Administered 2014-11-10 – 2014-11-11 (×2): 150 mg via ORAL
  Filled 2014-11-09 (×3): qty 1

## 2014-11-09 MED ORDER — TENOFOVIR DISOPROXIL FUMARATE 300 MG PO TABS
300.0000 mg | ORAL_TABLET | ORAL | Status: DC
  Administered 2014-11-10: 300 mg via ORAL
  Filled 2014-11-09: qty 1

## 2014-11-09 MED ORDER — SODIUM CHLORIDE 0.9 % IV SOLN
INTRAVENOUS | Status: DC
  Administered 2014-11-09 – 2014-11-10 (×2): via INTRAVENOUS

## 2014-11-09 MED ORDER — EFAVIRENZ-EMTRICITAB-TENOFOVIR 600-200-300 MG PO TABS: 1.0000 | ORAL_TABLET | Freq: Every day | ORAL | Status: DC

## 2014-11-09 MED ORDER — ONDANSETRON HCL 4 MG/2ML IJ SOLN: 4.0000 mg | Freq: Four times a day (QID) | INTRAMUSCULAR | Status: DC | PRN

## 2014-11-09 MED ORDER — CEFTRIAXONE SODIUM IN DEXTROSE 20 MG/ML IV SOLN
1.0000 g | INTRAVENOUS | Status: DC
  Administered 2014-11-09 – 2014-11-11 (×2): 1 g via INTRAVENOUS
  Filled 2014-11-09 (×2): qty 50

## 2014-11-09 NOTE — BH Assessment (Signed)
Tele Assessment Note   [name] [name] is an 59 y.o. female that this clinician attempted to assess.  Pt is not responding to questions and is nonverbal at this time. Per EMS patient lives in a group home type environment with godmother. Pt was found in the floor after having moved all of her clothes this evening. Per EMS patient is normally alert and oriented.  Consulted with EDP [place] who agreed pt be evaluated by psychiatry on rounds this AM.  Pt is medically cleared per EDP [place].  Updated ED and TTS staff.  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 HIV infection   . Substance abuse   . H/O head injury   . Seizures   . Dementia   . Depression    Axis IV: other psychosocial or environmental problems Axis V: 21-30 behavior considerably influenced by delusions or hallucinations OR serious impairment in judgment, communication OR inability to function in almost all areas  Past Medical History:  Past Medical History  Diagnosis Date  . HIV infection   . Substance abuse   . H/O head injury   . Seizures   . Dementia   . Depression     Past Surgical History  Procedure Laterality Date  . Unremarkable      Family History:  Family History  Problem Relation Age of Onset  . Colon cancer Neg Hx   . Diabetes Mother   . Behavior problems Mother   . Headache Mother     Social History:  reports that she has quit smoking. She has never used smokeless tobacco. She reports that she uses illicit drugs about 7 times per week. She reports that she does not drink alcohol.  Additional Social History:  Alcohol / Drug Use Pain Medications: see med list Prescriptions: see med list Over the Counter: see med list History of alcohol / drug use?:  (UTA) Longest period of sobriety (when/how long): UTA Negative Consequences of Use:  (UTA) Withdrawal Symptoms:  (UTA)  CIWA:  CIWA-Ar BP: 120/76 mmHg Pulse Rate: 81 COWS:    PATIENT STRENGTHS: (choose at least two) General fund of knowledge Supportive family/friends  Allergies: No Known Allergies  Home Medications:  (Not in a hospital admission)  OB/GYN Status:  No LMP recorded. Patient is postmenopausal.  General Assessment Data Location of Assessment: [facility] ED TTS Assessment: In system Is this a Tele or Face-to-Face Assessment?: Tele Assessment Is this an Initial Assessment or a Re-assessment for this encounter?: Initial Assessment Marital status: Single Maiden name:  (UTA) Is patient pregnant?:  (UTA) Pregnancy Status: Unable to assess Living Arrangements: Group Home Can pt return to current living arrangement?: Yes Admission Status: Voluntary Is patient capable of signing voluntary admission?: No Referral Source: Self/Family/Friend Insurance type: Medicaid  Medical Screening Exam ([place]) Medical Exam completed:  (na)  Crisis Care Plan Living Arrangements: Group Home Name of Psychiatrist: [place] Name of Therapist: UTA  Education Status Is patient currently in school?: No Current Grade: na Highest grade of school patient has completed: [employer] Name of school: na Contact person: na  Risk to self with the past 6 months Suicidal Ideation:  (UTA) Has patient been a risk to self within the past 6 months prior to admission? :  (UTA) Suicidal Intent:  (UTA) Has patient had any suicidal intent within the past 6 months prior to admission? :  (UTA) Is patient at  risk for suicide?:  (UTA) Suicidal Plan?:  (UTA) Has patient had any suicidal plan within the past 6 months prior to admission? :  (UTA) Access to Means:  (UTA) What has been your use of drugs/alcohol within the last 12 months?: UTA Previous Attempts/Gestures:  (UTA) How many times?:  (UTA) Other Self Harm Risks:  (UTA) Triggers for Past Attempts:  (UTA) Intentional Self Injurious Behavior:  (UTA) Family Suicide History: Unable  to assess Recent stressful life event(s):  (UTA) Persecutory voices/beliefs?:  Pincus Badder) Depression:  (UTA) Depression Symptoms:  (UTA) Substance abuse history and/or treatment for substance abuse?:  (UTA) Suicide prevention information given to non-admitted patients:  (UTA)  Risk to Others within the past 6 months Homicidal Ideation:  (UTA) Does patient have any lifetime risk of violence toward others beyond the six months prior to admission? :  (UTA) Thoughts of Harm to Others:  (UTA) Current Homicidal Intent:  (UTA) Current Homicidal Plan:  (UTA) Access to Homicidal Means:  (UTA) Identified Victim:  (UTA) History of harm to others?:  (UTA) Assessment of Violence:  (UTA) Violent Behavior Description:  (UTA) Does patient have access to weapons?:  ([place]) Criminal Charges Pending?:  (UTA) Does patient have a court date:  (UTA) Is patient on probation?:  (UTA)  Psychosis Hallucinations:  (Has reported in past per assessments - UTA)  Mental Status Report Appearance/Hygiene: In scrubs Eye Contact: Poor Motor Activity: Unremarkable Speech: Other (Comment) (Pt was mute) Level of Consciousness: Quiet/awake Mood:  (UTA) Affect: Unable to Assess Anxiety Level:  (UTA) Thought Processes: Unable to Assess Judgement: Unable to Assess Orientation: Unable to assess Obsessive Compulsive Thoughts/Behaviors: Unable to Assess  Cognitive Functioning Concentration: Unable to Assess Memory: Unable to Assess IQ:  (UTA) Insight: Unable to Assess Impulse Control: Unable to Assess Appetite:  (UTA) Weight Loss:  (UTA) Weight Gain:  (UTA) Sleep: Unable to Assess Total Hours of Sleep:  (UTA) Vegetative Symptoms: Unable to Assess  ADLScreening [facility] Assessment Services) Patient's cognitive ability adequate to safely complete daily activities?:  (UTA) Patient able to express need for assistance with ADLs?:  (UTA) Independently performs ADLs?:  (UTA)  Prior Inpatient Therapy Prior Inpatient  Therapy:  (UTA) Prior Therapy Dates: UTA Prior Therapy Facilty/Provider(s): UTA Reason for Treatment: UTA  Prior Outpatient Therapy Prior Outpatient Therapy:  (UTA) Prior Therapy Dates: UTA Prior Therapy Facilty/Provider(s): UTA Reason for Treatment: UTA Does patient have an ACCT team?: Unknown Does patient have Intensive In-House Services?  : Unknown Does patient have [facility] services? : Unknown Does patient have P4CC services?: Unknown  ADL Screening (condition at time of admission) Patient's cognitive ability adequate to safely complete daily activities?:  (UTA) Is the patient deaf or have difficulty hearing?:  (UTA) Does the patient have difficulty seeing, even when wearing glasses/contacts?:  (UTA) Does the patient have difficulty concentrating, remembering, or making decisions?:  (UTA) Patient able to express need for assistance with ADLs?:  (UTA) Does the patient have difficulty dressing or bathing?:  (UTA) Independently performs ADLs?:  (UTA) Does the patient have difficulty walking or climbing stairs?:  (UTA)  Home Assistive Devices/Equipment Home Assistive Devices/Equipment:  (UTA)    Abuse/Neglect Assessment (Assessment to be complete while patient is alone) Physical Abuse:  (UTA) Verbal Abuse:  (UTA) Sexual Abuse:  (UTA) Exploitation of patient/patient's resources:  (UTA) Self-Neglect:  (UTA) Possible abuse reported to::  (UTA) Values / Beliefs Cultural Requests During Hospitalization:  (UTA) Spiritual Requests During Hospitalization:  (UTA) Consults Spiritual Care Consult Needed:  (UTA) Social Work Scientific laboratory technician  Needed:  (UTA) Advance Directives (For Healthcare) Does patient have an advance directive?:  (UTA) Would patient like information on creating an advanced directive?:  (UTA)    Additional Information 1:1 In Past 12 Months?:  (UTA) CIRT Risk:  (UTA) Elopement Risk:  (UTA) Does patient have medical clearance?: Yes     Disposition:   Disposition Initial Assessment Completed for this Encounter: Yes Disposition of Patient: Other dispositions Other disposition(s): Other (Comment) (To be seen by psychiatry this AM)  Shaune Pascal, MS, [facility] Therapeutic Triage Specialist [facility]   11/09/2014 8:05 AM

## 2014-11-09 NOTE — ED Notes (Signed)
Attempted Telepsych video with patient.  Multiple attempts were made; however, the patient just smiled and stared blankly around the room.  She did not seem to be able to focus on any task or even respond to the therapist in any way.

## 2014-11-09 NOTE — ED Notes (Signed)
Hospitalist in bedside.

## 2014-11-09 NOTE — BH Assessment (Signed)
[place] Assessment Progress Note   Called and scheduled pt's tele assessment with this clinician.  Pt to be seen via tele assessment.  Shaune Pascal, MS, [facility] Therapeutic Triage Specialist [facility]

## 2014-11-09 NOTE — ED Notes (Signed)
Attempted to call report to TCU.  [place], RN will call me back as soon as she gets two other new patients settled.

## 2014-11-09 NOTE — H&P (Addendum)
[facility] History and Physical  [name] [name] ZOX:[id number] DOB: 10/16/1955 DOA: 11/08/2014  Referring physician: ER physician: Dr. Julianne Rice  PCP: No primary care provider on file.- pt cannot tell us about PCP due to altered mental status; she is from ?group home  Chief Complaint: altered mental status   HPI:  59 year old female with past medical history of HIV (on HAART), last CD4 about 3 weeks ago was 580, seizures, from ?group home who presented to [facility] ED after she was found lying on the floor after she removed all her clothes. She is not a good historian due to altered mental status. Her mother and caregiver have provided some information to ED over the phone saying they found her lying on the floor but they do not know the details of the events that took place.   In ED, pt is hemodynamically stable but confused. Her CT head did not reveal acute intracranial findings. Platelets were mildly decreased to 140 and hemoglobin was 11.3 otherwise unremarkable. She was admitted for further evaluation of altered mental status.   Assessment & Plan    Active Problems:   Acute encephalopathy - UTI verus depression however would not think depression would present altered mental status. ? 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 No acute intracranial process. 2. Moderate generalized atrophy, stable from prior.   Electronically Signed   By: Jeannine Boga M.D.   On: 11/08/2014 23:59     Code Status: Full Family Communication: Family not at the bedside  Disposition Plan: Admit for further evaluation; telemetry   Eun Vermeer, Dedra Skeens, MD  [facility] Pager [phone]  Time spent in minutes: 75 minutes  Review of Systems:  Unable to obtain to altered mental status   Past Medical History  Diagnosis Date  . HIV infection   . Substance abuse   . H/O head injury   . Seizures   . Dementia   . Depression    Past Surgical History  Procedure Laterality Date  . Unremarkable     Social History:  reports that she has quit smoking. She has never used smokeless tobacco. She reports that she uses illicit drugs about 7 times per week. She reports that she does not drink alcohol.  No Known Allergies  Family History:  Family History  Problem Relation Age of Onset  . Colon cancer Neg Hx   . Diabetes Mother   . Behavior problems Mother   . Headache Mother  Prior to Admission medications   Medication Sig Start Date End Date Taking? Authorizing Provider  efavirenz-emtricitabine-tenofovir (ATRIPLA) [phone] MG per tablet Take 1 tablet by mouth at bedtime. 10/04/14  Yes Thayer Headings, MD  levETIRAcetam (KEPPRA) 750 MG tablet Take 1 tablet (750 mg total) by mouth 2 (two) times daily. 09/06/14  Yes Penni Bombard, MD  Multiple Vitamin (MULTIVITAMIN) tablet Take 1 tablet by mouth daily.     Yes Historical Provider, MD  sertraline (ZOLOFT) 50 MG tablet Take 3 tablets (150 mg total) by mouth daily. 10/20/14  Yes Penni Bombard, MD   Physical Exam: Filed Vitals:   11/09/14 0059 11/09/14 0510  11/09/14 0726 11/09/14 0900  BP: 120/69 109/70 120/76 120/72  Pulse: 76 73 81 76  Temp:      TempSrc:      Resp: 18 18 17    Weight:    37.4 kg (82 lb 7.2 oz)  SpO2: 100% 97% 100% 100%    Physical Exam  Constitutional: Appears malnourished. No distress.  HENT: Normocephalic. No tonsillar erythema or exudates Eyes: Conjunctivae are normal. No scleral icterus.  Neck: Normal ROM. Neck supple. No JVD. No tracheal deviation. No thyromegaly.  CVS: RRR, S1/S2 +, no murmurs, no gallops, no carotid bruit.  Pulmonary: Effort and breath sounds normal, no stridor, rhonchi, wheezes, rales.  Abdominal: Soft. BS +,  no distension, tenderness, rebound or guarding.  Musculoskeletal: Normal range of motion. No edema and no tenderness.  Lymphadenopathy: No lymphadenopathy noted, cervical, inguinal. Neuro: Alert. Normal reflexes, muscle tone coordination. No focal neurologic deficits. Skin: Skin is warm and dry. No rash noted.  No erythema. No pallor.  Psychiatric: Unable to assess due to altered mental status  Labs on Admission:  Basic Metabolic Panel:  Recent Labs Lab 11/08/14 2356 11/09/14 1048  NA 139 137  K 3.5 4.2  CL 104 102  CO2 26 23  GLUCOSE 123* 110*  BUN 12 10  CREATININE 0.88 0.71  CALCIUM 8.7* 8.5*  MG  --  1.9   Liver Function Tests:  Recent Labs Lab 11/08/14 2356 11/09/14 1048  AST 57* 55*  ALT 35 35  ALKPHOS 65 63  BILITOT 0.4 0.4  PROT 8.1 8.3*  ALBUMIN 3.9 3.9   No results for input(s): LIPASE, AMYLASE in the last 168 hours. No results for input(s): AMMONIA in the last 168 hours. CBC:  Recent Labs Lab 11/08/14 2356 11/09/14 1048  WBC 6.1 5.6  NEUTROABS 3.7 3.4  HGB 11.3* 12.2  HCT 35.4* 37.8  MCV 83.3 82.9  PLT 140* 158   Cardiac Enzymes: No results for input(s): CKTOTAL, CKMB, CKMBINDEX, TROPONINI in the last 168 hours. BNP: Invalid input(s): POCBNP CBG: No results for input(s): GLUCAP in the last 168 hours.  If 7PM-7AM, please contact  night-coverage [email or web address] Password [facility] 11/09/2014, 7:56 PM

## 2014-11-10 DIAGNOSIS — R410 Disorientation, unspecified: Secondary | ICD-10-CM

## 2014-11-10 LAB — COMPREHENSIVE METABOLIC PANEL
ALT: 35 U/L (ref 14–54)
AST: 57 U/L — ABNORMAL HIGH (ref 15–41)
Albumin: 3.9 g/dL (ref 3.5–5.0)
Alkaline Phosphatase: 62 U/L (ref 38–126)
Anion gap: 10 (ref 5–15)
BILIRUBIN TOTAL: 0.4 mg/dL (ref 0.3–1.2)
BUN: 7 mg/dL (ref 6–20)
CALCIUM: 8.4 mg/dL — AB (ref 8.9–10.3)
CO2: 25 mmol/L (ref 22–32)
Chloride: 103 mmol/L (ref 101–111)
Creatinine, Ser: 0.79 mg/dL (ref 0.44–1.00)
GFR calc non Af Amer: >60 mL/min (ref >60)
Glucose, Bld: 85 mg/dL (ref 65–99)
POTASSIUM: 3.9 mmol/L (ref 3.5–5.1)
Sodium: 138 mmol/L (ref 135–145)
TOTAL PROTEIN: 8.6 g/dL — AB (ref 6.5–8.1)

## 2014-11-10 LAB — CBC
HCT: 38.4 % (ref 36.0–46.0)
Hemoglobin: 12.8 g/dL (ref 12.0–15.0)
MCH: 27.8 pg (ref 26.0–34.0)
MCHC: 33.3 g/dL (ref 30.0–36.0)
MCV: 83.3 fL (ref 78.0–100.0)
Platelets: 132 10*3/uL — ABNORMAL LOW (ref 150–400)
RBC: 4.61 MIL/uL (ref 3.87–5.11)
RDW: 14.3 % (ref 11.5–15.5)
WBC: 4.4 10*3/uL (ref 4.0–10.5)

## 2014-11-10 LAB — GLUCOSE, CAPILLARY: GLUCOSE-CAPILLARY: 81 mg/dL (ref 65–99)

## 2014-11-10 NOTE — Consult Note (Addendum)
[facility] Face-to-Face Psychiatry Consult   Reason for Consult:  Mental Status Changes  Referring Physician:  Dr. Charlies Silvers  Patient Identification: [name] [name] MRN:  [id number] Principal Diagnosis: Delirium  Diagnosis:   Patient Active Problem List   Diagnosis Date Noted  . Acute encephalopathy [G93.40] 11/09/2014  . Anemia of chronic disease [D63.8]   . Thrombocytopenia [D69.6]   . HIV disease [B20]   . Loss of weight [R63.4] 10/14/2014  . Screening examination for venereal disease [Z11.3] 02/05/2014  . Encounter for long-term (current) use of medications [Z79.899] 02/05/2014  . Seizure disorder [G40.909] 09/05/2013  . Loss of consciousness [R40.4] 09/05/2013  . Blurry vision, right eye [H53.8] 08/23/2013  . Underweight [R63.6] 08/23/2013  . Periodontal disease [K05.6] 09/01/2010  . VAGINAL DISCHARGE [N89.8] 04/08/2010  . TREMOR [R25.9] 04/25/2007  . AMENORRHEA [N91.2] 09/21/2006  . Human immunodeficiency virus (HIV) disease [B20] 03/11/2006  . SYPHILIS, EARLY, SYMPTOMATIC, SECONDARY NOS [A51.49] 03/11/2006  . DEPENDENCE, ALCOHOL NEC/NOS, UNSPECIFIED [F10.20] 03/11/2006  . DEPENDENCE, COCAINE, UNSPECIFIED [F14.20] 03/11/2006  . DEPRESSION [F32.9] 03/11/2006  . SEIZURE DISORDER [R56.9] 03/11/2006  . DEMENTIA, HX OF [Z86.59] 03/11/2006  . HEPATITIS B, HX OF [Z86.19] 03/11/2006    Total Time spent with patient: 20 minutes  Subjective:   [name] [name] is a 59 y.o. female patient admitted with  Mental status changes   HPI:   59 year old woman, group home resident , who was admitted due to  Confusion, mental status changes, bizarre behavior ( undressing and lying on floor ). Work up has included Head CT scan which is negative  Except for stable atrophy.  Patient is a poor historian, and although cooperative, she cannot provide information- speech is poor and very limited, and she does not answer most questions, she perseverates and repeats " CBS", referring to wanting to change her  TV's channel, pointing to her food tray and asking to " move it". She is currently not agitated , and presents calm , cooperative but as discussed with nursing staff , she was more agitated last evening and night, suggestive of " sundowning".  I have spoken with RN , who has spoken with patient's mother as well- mother has reported concern/suspicion that this may be drug induced, based on prior episodes and on history of substance abuse- her UDS is negative, and her BAL is < 5. There are no current symptoms of  Withdrawal- no significant tremors, no diaphoresis, no restlessness, no facial flushing, and vitals are stable . There is no indication or known history of recent  Head trauma. Patient does have  A history of chronic medical illnesses, to include HIV ( with a recent CD4 count of 500), Seizure Disorder, Secondary Syphilis  Mother has reported that there have been no recent new medications added to her regimen, but that Keppra dose was recently increased .  HPI Elements:   Worsening mental status, acute delirium, possibly superimposed on underlying dementia- current etiology not clear   Past Medical History:  Past Medical History  Diagnosis Date  . HIV infection   . Substance abuse   . H/O head injury   . Seizures   . Dementia   . Depression     Past Surgical History  Procedure Laterality Date  . Unremarkable     Family History:  Family History  Problem Relation Age of Onset  . Colon cancer Neg Hx   . Diabetes Mother   . Behavior problems Mother   . Headache Mother  Social History:  History  Alcohol Use No     History  Drug Use  . 7.00 per week    Comment: crack    History   Social History  . Marital Status: Single    Spouse Name: N/A  . Number of Children: 1  . Years of Education: N/A   Occupational History  .     Social History Main Topics  . Smoking status: Former Research scientist (life sciences)  . Smokeless tobacco: Never Used  . Alcohol Use: No  . Drug Use: 7.00 per week      Comment: crack  . Sexual Activity:    Partners: Male     Comment: pt. given condoms   Other Topics Concern  . None   Social History Narrative   Patient lives at home with her mother.   Caffeine Use: 0   Additional Social History:    Pain Medications: see med list Prescriptions: see med list Over the Counter: see med list History of alcohol / drug use?:  (UTA) Longest period of sobriety (when/how long): UTA Negative Consequences of Use:  (UTA) Withdrawal Symptoms:  (UTA)   Allergies:  No Known Allergies  Labs:  Results for orders placed or performed during the hospital encounter of 11/08/14 (from the past 48 hour(s))  Urinalysis, Routine w reflex microscopic (not at [facility])     Status: Abnormal   Collection Time: 11/08/14 11:46 PM  Result Value Ref Range   Color, Urine YELLOW YELLOW   APPearance CLEAR CLEAR   Specific Gravity, Urine 1.025 1.005 - 1.030   pH 6.5 5.0 - 8.0   Glucose, UA NEGATIVE NEGATIVE mg/dL   Hgb urine dipstick NEGATIVE NEGATIVE   Bilirubin Urine NEGATIVE NEGATIVE   Ketones, ur NEGATIVE NEGATIVE mg/dL   Protein, ur NEGATIVE NEGATIVE mg/dL   Urobilinogen, UA 0.2 0.0 - 1.0 mg/dL   Nitrite NEGATIVE NEGATIVE   Leukocytes, UA LARGE (A) NEGATIVE  Urine rapid drug screen (hosp performed)     Status: None   Collection Time: 11/08/14 11:46 PM  Result Value Ref Range   Opiates NONE DETECTED NONE DETECTED   Cocaine NONE DETECTED NONE DETECTED   Benzodiazepines NONE DETECTED NONE DETECTED   Amphetamines NONE DETECTED NONE DETECTED   Tetrahydrocannabinol NONE DETECTED NONE DETECTED   Barbiturates NONE DETECTED NONE DETECTED    Comment:        DRUG SCREEN FOR MEDICAL PURPOSES ONLY.  IF CONFIRMATION IS NEEDED FOR ANY PURPOSE, NOTIFY LAB WITHIN 5 DAYS.        LOWEST DETECTABLE LIMITS FOR URINE DRUG SCREEN Drug Class       Cutoff (ng/mL) Amphetamine      1000 Barbiturate      200 Benzodiazepine   696 Tricyclics       295 Opiates          300 Cocaine           300 THC              50   Urine microscopic-add on     Status: Abnormal   Collection Time: 11/08/14 11:46 PM  Result Value Ref Range   Squamous Epithelial / LPF RARE RARE   WBC, UA 7-10 <3 WBC/hpf   Bacteria, UA FEW (A) RARE   Urine-Other MUCOUS PRESENT   CBC with Differential/Platelet     Status: Abnormal   Collection Time: 11/08/14 11:56 PM  Result Value Ref Range   WBC 6.1 4.0 - 10.5 K/uL   RBC 4.25  3.87 - 5.11 MIL/uL   Hemoglobin 11.3 (L) 12.0 - 15.0 g/dL   HCT 35.4 (L) 36.0 - 46.0 %   MCV 83.3 78.0 - 100.0 fL   MCH 26.6 26.0 - 34.0 pg   MCHC 31.9 30.0 - 36.0 g/dL   RDW 14.3 11.5 - 15.5 %   Platelets 140 (L) 150 - 400 K/uL   Neutrophils Relative % 60 43 - 77 %   Neutro Abs 3.7 1.7 - 7.7 K/uL   Lymphocytes Relative 30 12 - 46 %   Lymphs Abs 1.8 0.7 - 4.0 K/uL   Monocytes Relative 7 3 - 12 %   Monocytes Absolute 0.4 0.1 - 1.0 K/uL   Eosinophils Relative 2 0 - 5 %   Eosinophils Absolute 0.1 0.0 - 0.7 K/uL   Basophils Relative 1 0 - 1 %   Basophils Absolute 0.1 0.0 - 0.1 K/uL  Comprehensive metabolic panel     Status: Abnormal   Collection Time: 11/08/14 11:56 PM  Result Value Ref Range   Sodium 139 135 - 145 mmol/L   Potassium 3.5 3.5 - 5.1 mmol/L   Chloride 104 101 - 111 mmol/L   CO2 26 22 - 32 mmol/L   Glucose, Bld 123 (H) 65 - 99 mg/dL   BUN 12 6 - 20 mg/dL   Creatinine, Ser 0.88 0.44 - 1.00 mg/dL   Calcium 8.7 (L) 8.9 - 10.3 mg/dL   Total Protein 8.1 6.5 - 8.1 g/dL   Albumin 3.9 3.5 - 5.0 g/dL   AST 57 (H) 15 - 41 U/L   ALT 35 14 - 54 U/L   Alkaline Phosphatase 65 38 - 126 U/L   Total Bilirubin 0.4 0.3 - 1.2 mg/dL   GFR calc non Af Amer >60 >60 mL/min   GFR calc Af Amer >60 >60 mL/min    Comment: (NOTE) The eGFR has been calculated using the CKD EPI equation. This calculation has not been validated in all clinical situations. eGFR's persistently <60 mL/min signify possible Chronic Kidney Disease.    Anion gap 9 5 - 15  Ethanol     Status: None    Collection Time: 11/08/14 11:57 PM  Result Value Ref Range   Alcohol, Ethyl (B) <5 <5 mg/dL    Comment:        LOWEST DETECTABLE LIMIT FOR SERUM ALCOHOL IS 5 mg/dL FOR MEDICAL PURPOSES ONLY   Comprehensive metabolic panel     Status: Abnormal   Collection Time: 11/09/14 10:48 AM  Result Value Ref Range   Sodium 137 135 - 145 mmol/L   Potassium 4.2 3.5 - 5.1 mmol/L   Chloride 102 101 - 111 mmol/L   CO2 23 22 - 32 mmol/L   Glucose, Bld 110 (H) 65 - 99 mg/dL   BUN 10 6 - 20 mg/dL   Creatinine, Ser 0.71 0.44 - 1.00 mg/dL   Calcium 8.5 (L) 8.9 - 10.3 mg/dL   Total Protein 8.3 (H) 6.5 - 8.1 g/dL   Albumin 3.9 3.5 - 5.0 g/dL   AST 55 (H) 15 - 41 U/L   ALT 35 14 - 54 U/L   Alkaline Phosphatase 63 38 - 126 U/L   Total Bilirubin 0.4 0.3 - 1.2 mg/dL   GFR calc non Af Amer >60 >60 mL/min   GFR calc Af Amer >60 >60 mL/min    Comment: (NOTE) The eGFR has been calculated using the CKD EPI equation. This calculation has not been validated in all clinical situations. eGFR's  persistently <60 mL/min signify possible Chronic Kidney Disease.    Anion gap 12 5 - 15  Magnesium     Status: None   Collection Time: 11/09/14 10:48 AM  Result Value Ref Range   Magnesium 1.9 1.7 - 2.4 mg/dL  CBC WITH DIFFERENTIAL     Status: None   Collection Time: 11/09/14 10:48 AM  Result Value Ref Range   WBC 5.6 4.0 - 10.5 K/uL   RBC 4.56 3.87 - 5.11 MIL/uL   Hemoglobin 12.2 12.0 - 15.0 g/dL   HCT 37.8 36.0 - 46.0 %   MCV 82.9 78.0 - 100.0 fL   MCH 26.8 26.0 - 34.0 pg   MCHC 32.3 30.0 - 36.0 g/dL   RDW 14.3 11.5 - 15.5 %   Platelets 158 150 - 400 K/uL   Neutrophils Relative % 59 43 - 77 %   Neutro Abs 3.4 1.7 - 7.7 K/uL   Lymphocytes Relative 30 12 - 46 %   Lymphs Abs 1.7 0.7 - 4.0 K/uL   Monocytes Relative 8 3 - 12 %   Monocytes Absolute 0.5 0.1 - 1.0 K/uL   Eosinophils Relative 2 0 - 5 %   Eosinophils Absolute 0.1 0.0 - 0.7 K/uL   Basophils Relative 1 0 - 1 %   Basophils Absolute 0.0 0.0 - 0.1  K/uL  APTT     Status: Abnormal   Collection Time: 11/09/14 10:48 AM  Result Value Ref Range   aPTT 20 (L) 24 - 37 seconds  Protime-INR     Status: None   Collection Time: 11/09/14 10:48 AM  Result Value Ref Range   Prothrombin Time 13.7 11.6 - 15.2 seconds   INR 1.03 0.00 - 1.49  TSH     Status: None   Collection Time: 11/09/14 10:48 AM  Result Value Ref Range   TSH 1.726 0.350 - 4.500 uIU/mL  Comprehensive metabolic panel     Status: Abnormal   Collection Time: 11/10/14  5:26 AM  Result Value Ref Range   Sodium 138 135 - 145 mmol/L   Potassium 3.9 3.5 - 5.1 mmol/L   Chloride 103 101 - 111 mmol/L   CO2 25 22 - 32 mmol/L   Glucose, Bld 85 65 - 99 mg/dL   BUN 7 6 - 20 mg/dL   Creatinine, Ser 0.79 0.44 - 1.00 mg/dL   Calcium 8.4 (L) 8.9 - 10.3 mg/dL   Total Protein 8.6 (H) 6.5 - 8.1 g/dL   Albumin 3.9 3.5 - 5.0 g/dL   AST 57 (H) 15 - 41 U/L   ALT 35 14 - 54 U/L   Alkaline Phosphatase 62 38 - 126 U/L   Total Bilirubin 0.4 0.3 - 1.2 mg/dL   GFR calc non Af Amer >60 >60 mL/min   GFR calc Af Amer >60 >60 mL/min    Comment: (NOTE) The eGFR has been calculated using the CKD EPI equation. This calculation has not been validated in all clinical situations. eGFR's persistently <60 mL/min signify possible Chronic Kidney Disease.    Anion gap 10 5 - 15  CBC     Status: Abnormal   Collection Time: 11/10/14  5:26 AM  Result Value Ref Range   WBC 4.4 4.0 - 10.5 K/uL   RBC 4.61 3.87 - 5.11 MIL/uL   Hemoglobin 12.8 12.0 - 15.0 g/dL   HCT 38.4 36.0 - 46.0 %   MCV 83.3 78.0 - 100.0 fL   MCH 27.8 26.0 - 34.0 pg  MCHC 33.3 30.0 - 36.0 g/dL   RDW 14.3 11.5 - 15.5 %   Platelets 132 (L) 150 - 400 K/uL  Glucose, capillary     Status: None   Collection Time: 11/10/14  8:08 AM  Result Value Ref Range   Glucose-Capillary 81 65 - 99 mg/dL    Vitals: Blood pressure 117/88, pulse 80, temperature 97.6 F (36.4 C), temperature source Axillary, resp. rate 16, weight 82 lb 0.2 oz (37.2 kg),  SpO2 100 %.  Risk to Self: Suicidal Ideation:  (UTA) Suicidal Intent:  (UTA) Is patient at risk for suicide?:  (UTA) Suicidal Plan?:  (UTA) Access to Means:  (UTA) What has been your use of drugs/alcohol within the last 12 months?: UTA How many times?:  (UTA) Other Self Harm Risks:  (UTA) Triggers for Past Attempts:  (UTA) Intentional Self Injurious Behavior:  (UTA) Risk to Others: Homicidal Ideation:  (UTA) Thoughts of Harm to Others:  (UTA) Current Homicidal Intent:  (UTA) Current Homicidal Plan:  (UTA) Access to Homicidal Means:  (UTA) Identified Victim:  (UTA) History of harm to others?:  (UTA) Assessment of Violence:  (UTA) Violent Behavior Description:  (UTA) Does patient have access to weapons?:  (UTA) Criminal Charges Pending?:  (UTA) Does patient have a court date:  Special educational needs teacher) Prior Inpatient Therapy: Prior Inpatient Therapy:  (UTA) Prior Therapy Dates: UTA Prior Therapy Facilty/Provider(s): UTA Reason for Treatment: UTA Prior Outpatient Therapy: Prior Outpatient Therapy:  (UTA) Prior Therapy Dates: UTA Prior Therapy Facilty/Provider(s): UTA Reason for Treatment: UTA Does patient have an ACCT team?: Unknown Does patient have Intensive In-House Services?  : Unknown Does patient have [facility] services? : Unknown Does patient have P4[facility] services?: Unknown  Current Facility-Administered Medications  Medication Dose Route Frequency Provider Last Rate Last Dose  . cefTRIAXone (ROCEPHIN) 1 g in dextrose 5 % 50 mL IVPB - Premix  1 g Intravenous Q24H Robbie Lis, MD   1 g at 11/09/14 2355  . efavirenz (SUSTIVA) tablet 600 mg  600 mg Oral QHS Robbie Lis, MD   600 mg at 11/09/14 2206  . emtricitabine (EMTRIVA) capsule 200 mg  200 mg Oral Q48H Robbie Lis, MD      . haloperidol lactate (HALDOL) injection 2 mg  2 mg Intravenous Q6H PRN Robbie Lis, MD      . levETIRAcetam (KEPPRA) tablet 750 mg  750 mg Oral BID Robbie Lis, MD   750 mg at 11/10/14 1018  . LORazepam  (ATIVAN) injection 1 mg  1 mg Intravenous Q4H PRN Robbie Lis, MD   1 mg at 11/10/14 0451  . multivitamin with minerals tablet 1 tablet  1 tablet Oral Daily Robbie Lis, MD   1 tablet at 11/10/14 1018  . ondansetron (ZOFRAN) tablet 4 mg  4 mg Oral Q6H PRN Robbie Lis, MD       Or  . ondansetron [facility]) injection 4 mg  4 mg Intravenous Q6H PRN Robbie Lis, MD      . sertraline (ZOLOFT) tablet 150 mg  150 mg Oral Daily Robbie Lis, MD   150 mg at 11/10/14 1018  . sodium chloride 0.9 % injection 3 mL  3 mL Intravenous Q12H Robbie Lis, MD   3 mL at 11/09/14 1000  . tenofovir (VIREAD) tablet 300 mg  300 mg Oral Q48H Robbie Lis, MD        Musculoskeletal: Strength & Muscle Tone: within normal limits Gait & Station: gait  not examined  Patient leans: N/A  Psychiatric Specialty Exam: Physical Exam  ROS unable to obtain ROS due to poor communication  Blood pressure 117/88, pulse 80, temperature 97.6 F (36.4 C), temperature source Axillary, resp. rate 16, weight 82 lb 0.2 oz (37.2 kg), SpO2 100 %.Body mass index is 16.56 kg/(m^2).  General Appearance: poorly groomed, in bed , restraints , appears chronically ill  Eye Contact::  Good  Speech:  minimal  Volume:  Decreased  Mood:  difficult to assess due to lack of communication but does not appear depressed and smiled pleasantly throughout session  Affect:  Appropriate  Thought Process:  slow   Orientation:  Other:  unable to assess due to poor communication  Thought Content:  does not appear internally preoccupied or paranoid at this time  Suicidal Thoughts:  No- no mention of SI or HI, does not endorse   Homicidal Thoughts:  No  Memory:  poor   Judgement:  Impaired  Insight:  Lacking  Psychomotor Activity:  currently not agitated   Concentration:  Poor  Recall:  Poor  Fund of Knowledge:Poor  Language: Poor  Akathisia:  Negative  Handed:  Right  AIMS (if indicated):     Assets:  Desire for Improvement Resilience   ADL's:  Impaired  Cognition: impaired, unable to assess orientation  Due to lack of communication but  Presents alert and attentive at this time  Sleep:      Medical Decision Making: Review of Psycho-Social Stressors (1), Review or order clinical lab tests (1), Established Problem, Worsening (2) and Review of Medication Regimen & Side Effects (2)  Recommendations-  1. Presentation is of acute delirium, possibly superimposed on underlying dementia stemming from chronic illnesses . 2. As per mother, substance abuse is suspected by family as causative , but patient not currently presenting with symptoms of WDL and UDS/BAL negative . There are drugs not identified by UDS , so drug etiology cannot be ruled out . 3. Recent Keppra dose increase is only medication change identified by mother as recent and possibly temporally related to MS changes . Keppra can cause confusion as side effect.  4. Based on history of secondary syphilis, HIV, current  mental status changes, very impaired  speech , consider Neurology / ID consultations 5. Agree with low dose Haldol 1-2 mgrs Q 6 hours PRN for agitation. Would obtain routine EKG to rule out QTc prolongation. 6. Agree with low dose Ativan 0.5 mgrs- 1 mgr Q 6 hours PRN severe agitation. 7. Would continue Zoloft at present dose 8.  Will follow with you- please contact psychiatrist/Behavioral Health on call if any questions  Mckynna Vanloan 11/10/2014 1:13 PM

## 2014-11-10 NOTE — Progress Notes (Signed)
PT Cancellation Note  Patient Details Name: [name] [name] MRN: [id number] DOB: 14-Dec-1955   Cancelled Treatment:     PT eval received but deferred this date on advise of RN.  Pt currently in 4pt restraints and not following cues.  Will follow.   Matalyn Nawaz 11/10/2014, 10:11 AM

## 2014-11-10 NOTE — Progress Notes (Signed)
Patient ID: [name] [name], female   DOB: 1955/09/19, 59 y.o.   MRN: [id number] [facility] PROGRESS NOTE  [name] [name] TDV:[id number] DOB: 1955-12-29 DOA: 11/08/2014 PCP: No primary care provider on file. - will ask CM to help establish PCP in [facility]  Brief narrative:    59 year old female with past medical history of HIV (on HAART), last CD4 about 3 weeks ago was 580, seizures, from ?group home who presented to [facility] ED after she was found lying on the floor after she removed all her clothes. Her mother and caregiver have provided some information to ED over the phone saying they found her lying on the floor but they did not know the details of the events that took place. Pt is not a good historian due to altered mental status.   In ED, pt is hemodynamically stable but confused. Her CT head did not reveal acute intracranial findings. Platelets were mildly decreased to 140 and hemoglobin was 11.3 otherwise unremarkable.   Barrier to discharge: Pt needs psych evaluation prior to discharge. Anticipate discharge by 7/4.   Assessment/Plan:     Active Problems:  Acute encephalopathy - UTI verus depression versus possible HIV encephalopathy - Psych evaluation pending - Agitated overnight so has restraints on  - Continue ativan and haldol intermittently to control agitation   UTI - Large leukocytes on urinalysis - Urine culture pending - Continue daily rocephin    Seizures - Continue Keppra - No reports of seizures    Anemia of chronic disease - Likely due to HIV - Hemoglobin 11.3 on admission and now WNL - Stable    Thrombocytopenia - Due to HIV - Platelets transiently normalized and this am 132 - No reports of bleeding    HIV disease - Continue ART - Recent CD4 590    Underweight, severe protein calorie malnutrition - Nutrition consulted   DVT prophylaxis:  - SCD's bilaterally    Code Status: Full.  Family Communication:  Family not at the bedside   Disposition Plan: needs psych evaluation. Likely discharge by 7/4 or 7/5.  IV access:  Peripheral IV  Procedures and diagnostic studies:    Ct Head Wo Contrast 11/08/2014 1. No acute intracranial process. 2. Moderate generalized atrophy, stable from prior. Electronically Signed By: Jeannine Boga M.D. On: 11/08/2014 23:59   Medical Consultants:  None   Other Consultants:  Nutrition  IAnti-Infectives:   Rocephin 11/09/2014 -->    Leisa Lenz, MD  [facility] Pager [phone]  Time spent in minutes: 15 minutes  If 7PM-7AM, please contact night-coverage [email or web address] Password [email or web address] 11/10/2014, 10:38 AM   LOS: 1 day    HPI/Subjective: No acute overnight events. Agitated overnight and required restraints.   Objective: Filed Vitals:   11/09/14 0726 11/09/14 0900 11/09/14 2058 11/10/14 0550  BP: 120/76 120/72 122/76 117/88  Pulse: 81 76 74 80  Temp:   97.6 F (36.4 C) 97.6 F (36.4 C)  TempSrc:   Axillary Axillary  Resp: 17  14 16   Weight:  37.4 kg (82 lb 7.2 oz)  37.2 kg (82 lb 0.2 oz)  SpO2: 100% 100% 100% 100%    Intake/Output Summary (Last 24 hours) at 11/10/14 1038 Last data filed at 11/10/14 0600  Gross per 24 hour  Intake   1390 ml  Output      1 ml  Net   1389 ml    Exam:   General:  Pt is sleeping, not in acute distress; has mittens on  Cardiovascular: Regular rate and rhythm, S1/S2, no murmurs  Respiratory: Clear to auscultation bilaterally, no wheezing, no crackles, no rhonchi  Abdomen: Soft, non tender, non distended, bowel sounds present  Extremities: No edema, pulses DP and PT palpable bilaterally  Neuro: Grossly nonfocal  Data Reviewed: Basic Metabolic Panel:  Recent Labs Lab 11/08/14 2356 11/09/14 1048 11/10/14 0526  NA 139 137 138  K 3.5 4.2 3.9  CL 104 102 103  CO2 26 23 25   GLUCOSE 123* 110* 85  BUN 12 10 7   CREATININE 0.88 0.71 0.79  CALCIUM 8.7* 8.5* 8.4*  MG  --  1.9  --    Liver Function  Tests:  Recent Labs Lab 11/08/14 2356 11/09/14 1048 11/10/14 0526  AST 57* 55* 57*  ALT 35 35 35  ALKPHOS 65 63 62  BILITOT 0.4 0.4 0.4  PROT 8.1 8.3* 8.6*  ALBUMIN 3.9 3.9 3.9   No results for input(s): LIPASE, AMYLASE in the last 168 hours. No results for input(s): AMMONIA in the last 168 hours. CBC:  Recent Labs Lab 11/08/14 2356 11/09/14 1048 11/10/14 0526  WBC 6.1 5.6 4.4  NEUTROABS 3.7 3.4  --   HGB 11.3* 12.2 12.8  HCT 35.4* 37.8 38.4  MCV 83.3 82.9 83.3  PLT 140* 158 132*   Cardiac Enzymes: No results for input(s): CKTOTAL, CKMB, CKMBINDEX, TROPONINI in the last 168 hours. BNP: Invalid input(s): POCBNP CBG:  Recent Labs Lab 11/10/14 0808  GLUCAP 81    No results found for this or any previous visit (from the past 240 hour(s)).   Scheduled Meds: . cefTRIAXone (ROCEPHIN)  IV  1 g Intravenous Q24H  . efavirenz  600 mg Oral QHS  . emtricitabine  200 mg Oral Q48H  . levETIRAcetam  750 mg Oral BID  . multivitamin with minerals  1 tablet Oral Daily  . sertraline  150 mg Oral Daily  . tenofovir  300 mg Oral Q48H

## 2014-11-11 DIAGNOSIS — N390 Urinary tract infection, site not specified: Secondary | ICD-10-CM

## 2014-11-11 DIAGNOSIS — G9340 Encephalopathy, unspecified: Principal | ICD-10-CM

## 2014-11-11 DIAGNOSIS — E43 Unspecified severe protein-calorie malnutrition: Secondary | ICD-10-CM | POA: Insufficient documentation

## 2014-11-11 DIAGNOSIS — D638 Anemia in other chronic diseases classified elsewhere: Secondary | ICD-10-CM

## 2014-11-11 DIAGNOSIS — B20 Human immunodeficiency virus [HIV] disease: Secondary | ICD-10-CM

## 2014-11-11 LAB — URINE CULTURE: Culture: 9000

## 2014-11-11 LAB — GLUCOSE, CAPILLARY: GLUCOSE-CAPILLARY: 84 mg/dL (ref 65–99)

## 2014-11-11 MED ORDER — ENSURE ENLIVE PO LIQD: 237.0000 mL | Freq: Two times a day (BID) | ORAL | Status: DC

## 2014-11-11 MED ORDER — NITROFURANTOIN MONOHYD MACRO 100 MG PO CAPS: 100.0000 mg | ORAL_CAPSULE | Freq: Two times a day (BID) | ORAL | Status: DC

## 2014-11-11 MED ORDER — LORAZEPAM 1 MG PO TABS: 1.0000 mg | ORAL_TABLET | Freq: Three times a day (TID) | ORAL | Status: DC | PRN

## 2014-11-11 NOTE — Discharge Instructions (Signed)
Altered Mental Status Altered mental status most often refers to an abnormal change in your responsiveness and awareness. It can affect your speech, thought, mobility, memory, attention span, or alertness. It can range from slight confusion to complete unresponsiveness (coma). Altered mental status can be a sign of a serious underlying medical condition. Rapid evaluation and medical treatment is necessary for patients having an altered mental status. CAUSES   Low blood sugar (hypoglycemia) or diabetes.  Severe loss of body fluids (dehydration) or a body salt (electrolyte) imbalance.  A stroke or other neurologic problem, such as dementia or delirium.  A head injury or tumor.  A drug or alcohol overdose.  Exposure to toxins or poisons.  Depression, anxiety, and stress.  A low oxygen level (hypoxia).  An infection.  Blood loss.  Twitching or shaking (seizure).  Heart problems, such as heart attack or heart rhythm problems (arrhythmias).  A body temperature that is too low or too high (hypothermia or hyperthermia). DIAGNOSIS  A diagnosis is based on your history, symptoms, physical and neurologic examinations, and diagnostic tests. Diagnostic tests may include:  Measurement of your blood pressure, pulse, breathing, and oxygen levels (vital signs).  Blood tests.  Urine tests.  X-ray exams.  A computerized magnetic scan (magnetic resonance imaging, MRI).  A computerized X-ray scan (computed tomography, CT scan). TREATMENT  Treatment will depend on the cause. Treatment may include:  Management of an underlying medical or mental health condition.  Critical care or support in the hospital. [place]   Only take over-the-counter or prescription medicines for pain, discomfort, or fever as directed by your caregiver.  Manage underlying conditions as directed by your caregiver.  Eat a healthy, well-balanced diet to maintain strength.  Join a support group or  prevention program to cope with the condition or trauma that caused the altered mental status. Ask your caregiver to help choose a program that works for you.  Follow up with your caregiver for further examination, therapy, or testing as directed. SEEK MEDICAL CARE IF:   You feel unwell or have chills.  You or your family notice a change in your behavior or your alertness.  You have trouble following your caregiver's treatment plan.  You have questions or concerns. SEEK IMMEDIATE MEDICAL CARE IF:   You have a rapid heartbeat or have chest pain.  You have difficulty breathing.  You have a fever.  You have a headache with a stiff neck.  You cough up blood.  You have blood in your urine or stool.  You have severe agitation or confusion. MAKE SURE YOU:   Understand these instructions.  Will watch your condition.  Will get help right away if you are not doing well or get worse. Document Released: 10/14/2009 Document Revised: 07/19/2011 Document Reviewed: 10/14/2009 [facility] Patient Information 2015 [place], [place]. This information is not intended to replace advice given to you by your health care provider. Make sure you discuss any questions you have with your health care provider. Urinary Tract Infection Urinary tract infections (UTIs) can develop anywhere along your urinary tract. Your urinary tract is your body's drainage system for removing wastes and extra water. Your urinary tract includes two kidneys, two ureters, a bladder, and a urethra. Your kidneys are a pair of bean-shaped organs. Each kidney is about the size of your fist. They are located below your ribs, one on each side of your spine. CAUSES Infections are caused by microbes, which are microscopic organisms, including fungi, viruses, and bacteria. These  organisms are so small that they can only be seen through a microscope. Bacteria are the microbes that most commonly cause UTIs. SYMPTOMS  Symptoms of UTIs may  vary by age and gender of the patient and by the location of the infection. Symptoms in young women typically include a frequent and intense urge to urinate and a painful, burning feeling in the bladder or urethra during urination. Older women and men are more likely to be tired, shaky, and weak and have muscle aches and abdominal pain. A fever may mean the infection is in your kidneys. Other symptoms of a kidney infection include pain in your back or sides below the ribs, nausea, and vomiting. DIAGNOSIS To diagnose a UTI, your caregiver will ask you about your symptoms. Your caregiver also will ask to provide a urine sample. The urine sample will be tested for bacteria and white blood cells. White blood cells are made by your body to help fight infection. TREATMENT  Typically, UTIs can be treated with medication. Because most UTIs are caused by a bacterial infection, they usually can be treated with the use of antibiotics. The choice of antibiotic and length of treatment depend on your symptoms and the type of bacteria causing your infection. HOME CARE INSTRUCTIONS  If you were prescribed antibiotics, take them exactly as your caregiver instructs you. Finish the medication even if you feel better after you have only taken some of the medication.  Drink enough water and fluids to keep your urine clear or pale yellow.  Avoid caffeine, tea, and carbonated beverages. They tend to irritate your bladder.  Empty your bladder often. Avoid holding urine for long periods of time.  Empty your bladder before and after sexual intercourse.  After a bowel movement, women should cleanse from front to back. Use each tissue only once. SEEK MEDICAL CARE IF:   You have back pain.  You develop a fever.  Your symptoms do not begin to resolve within 3 days. SEEK IMMEDIATE MEDICAL CARE IF:   You have severe back pain or lower abdominal pain.  You develop chills.  You have nausea or vomiting.  You have  continued burning or discomfort with urination. MAKE SURE YOU:   Understand these instructions.  Will watch your condition.  Will get help right away if you are not doing well or get worse. Document Released: 02/03/2005 Document Revised: 10/26/2011 Document Reviewed: 06/04/2011 [facility] Patient Information 2015 [place], [place]. This information is not intended to replace advice given to you by your health care provider. Make sure you discuss any questions you have with your health care provider.

## 2014-11-11 NOTE — Discharge Summary (Signed)
Physician Discharge Summary  [name] [name] ZTI:[id number] DOB: 1955-07-21 DOA: 11/08/2014  PCP: No primary care provider on file. will set up appointment with [facility] on discharge  Admit date: 11/08/2014 Discharge date: 11/11/2014  Recommendations for Outpatient Follow-up:  1. Per psychiatry, continue Ativan every 8 hours as needed to control agitation. 2. Continue Macrobid 100 mg twice daily for 5 days for treatment of UTI.  Discharge Diagnoses:  Active Problems:   Acute encephalopathy   Anemia of chronic disease   Thrombocytopenia   HIV disease   Protein-calorie malnutrition, severe    Discharge Condition: stable   Diet recommendation: as tolerated   History of present illness:  59 year old female with past medical history of HIV (on HAART), last CD4 about 3 weeks ago was 580, seizures, from home who presented to [facility] ED after she was found lying on the floor after she removed all her clothes. Her mother and caregiver have provided some information to ED over the phone saying they found her lying on the floor but they did not know the details of the events that took place. Pt is not a good historian due to altered mental status.  In ED, pt is hemodynamically stable but confused. Her CT head did not reveal acute intracranial findings. Platelets were mildly decreased to 140 and hemoglobin was 11.3 otherwise unremarkable.  Psychiatry has seen the patient in consultation. They recommended continuing Zoloft and adding Ativan and/or Haldol to control agitation.   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 We added Ativan on discharge to control agitation if needed. - Spoke with patient's home health aide who acknowledged the addition of Ativan as needed on medication list. - Patient is medically stable for discharge.   UTI - Large leukocytes seen on  urinalysis - Urine culture pending as of this morning 11/11/2014. - Patient has received total of 3 days of IV Rocephin. We will prescribe empiric Macrobid for 5 days on discharge.   Seizures - Continue Keppra on discharge    Anemia of chronic disease - Likely due to HIV - Hemoglobin 11.3 on admission and then WNL   Thrombocytopenia - Due to HIV - Platelets transiently normalized and then down to 132. Stable.  - No reports of bleeding    HIV disease - Continue ART - Recent CD4 590    Underweight, severe protein calorie malnutrition - Nutrition consulted  - Continue nutritional supplementation on discharge   DVT prophylaxis:  - SCD's bilaterally    Code Status: Full.  Family Communication: Family not at the bedside; I spoke with the patient's home health aide who will provide transportation for patient on discharge.   IV access:  Peripheral IV  Procedures and diagnostic studies:   Ct Head Wo Contrast 11/08/2014 1. No acute intracranial process. 2. Moderate generalized atrophy, stable from prior. Electronically Signed By: [name] [name] M.D. On: 11/08/2014 23:59   Medical Consultants:  None   Other Consultants:  Nutrition  IAnti-Infectives:   Rocephin 11/09/2014 --> 11/11/2014    Signed:  Leisa Lenz, MD  [facility] 11/11/2014, 10:15 AM  Pager #: [phone]  Time spent in minutes: more than 30 minutes  Discharge Exam: Filed Vitals:   11/11/14 0529  BP: 110/65  Pulse: 111  Temp: 97.6 F (36.4 C)  Resp: 14   Filed Vitals:   11/10/14 1359 11/10/14 2120 11/11/14 0529 11/11/14 0700  BP: 111/86 120/75 110/65   Pulse: 81 74 111   Temp: 97.4 F (36.3 C) 97.4 F (36.3 C) 97.6 F (36.4 C)   TempSrc: Oral Axillary Axillary   Resp: 18 16 14    Height:    4\' 11"  (1.499 m)  Weight:      SpO2: 99% 99% 100%     General: Pt is alert, disoriented, not in acute distress Cardiovascular: Regular rate and rhythm, S1/S2  +, no murmurs Respiratory: Clear to auscultation bilaterally, no wheezing, no crackles, no rhonchi Abdominal: Soft, non tender, non distended, bowel sounds +, no guarding Extremities: no edema, no cyanosis, pulses palpable bilaterally DP and PT Neuro: Grossly nonfocal  Discharge Instructions  Discharge Instructions    Call MD for:  difficulty breathing, headache or visual disturbances    Complete by:  As directed      Call MD for:  persistant dizziness or light-headedness    Complete by:  As directed      Call MD for:  persistant nausea and vomiting    Complete by:  As directed      Call MD for:  severe uncontrolled pain    Complete by:  As directed      Diet - low sodium heart healthy    Complete by:  As directed      Discharge instructions    Complete by:  As directed   1. Per psychiatry, continue Ativan every 8 hours as needed to control agitation. 2. Continue Macrobid 100 mg twice daily for 5 days for treatment of UTI.     Increase activity slowly    Complete by:  As directed             Medication List    TAKE these medications        efavirenz-emtricitabine-tenofovir 600-200-300 MG per tablet  Commonly known as:  ATRIPLA  Take 1 tablet by mouth at bedtime.     feeding supplement (ENSURE ENLIVE) Liqd  Take 237 mLs by mouth 2 (two) times daily between meals.     levETIRAcetam 750 MG tablet  Commonly known as:  KEPPRA  Take 1 tablet (750 mg total) by mouth 2 (two) times daily.     LORazepam 1 MG tablet  Commonly known as:  ATIVAN  Take 1 tablet (1 mg total) by mouth every 8 (eight) hours as needed for anxiety.     multivitamin tablet  Take 1 tablet by mouth daily.     nitrofurantoin (macrocrystal-monohydrate) 100 MG capsule  Commonly known as:  MACROBID  Take 1 capsule (100 mg total) by mouth 2 (two) times daily.     sertraline 50 MG tablet  Commonly known as:  ZOLOFT  Take 3 tablets (150 mg total) by mouth daily.           Follow-up Information     Follow up with [facility]    . Schedule an appointment as soon as possible for a visit in 1 week.   Why:  Follow up appt after recent hospitalization   Contact information:   [address] [place] [address] [phone]       The results of significant diagnostics from this hospitalization (including imaging, microbiology, ancillary and laboratory) are listed below for reference.    Significant Diagnostic Studies: Dg Chest 2 View  10/14/2014   CLINICAL DATA:  HIV.  20 lb weight loss in 1 month.  EXAM: CHEST  2 VIEW  COMPARISON:  09/05/2013  FINDINGS: Heart size and pulmonary vascularity are normal and the lungs are clear. No effusions. No acute osseous abnormality. Slight thoracolumbar scoliosis.  IMPRESSION: No acute abnormality.   Electronically Signed   By: Lorriane Shire M.D.   On: 10/14/2014 16:53   Ct Head Wo Contrast  11/08/2014   CLINICAL DATA:  Initial evaluation for acute altered mental status.  EXAM: CT HEAD WITHOUT CONTRAST  TECHNIQUE: Contiguous axial images were obtained from the base of the skull through the vertex without intravenous contrast.  COMPARISON:  Prior CT from 05/23/2014  FINDINGS: Moderate cerebral and cerebellar atrophy again seen, similar to previous. Mild chronic small vessel ischemic changes present within the periventricular white matter.  No acute large vessel territory infarct or hemorrhage. No mass lesion, midline shift, or mass effect. No hydrocephalus. No extra-axial fluid collection.  Scalp soft tissues within normal limits. No acute abnormality about the orbits.  Calvarium intact. Minimal opacity present within the ethmoidal air cells bilaterally. Paranasal sinuses are otherwise clear. No mastoid effusion.  IMPRESSION: 1. No acute intracranial process. 2. Moderate generalized atrophy, stable from prior.   Electronically Signed   By: [name] [name] M.D.   On: 11/08/2014 23:59    Microbiology: No  results found for this or any previous visit (from the past 240 hour(s)).   Labs: Basic Metabolic Panel:  Recent Labs Lab 11/08/14 2356 11/09/14 1048 11/10/14 0526  NA 139 137 138  K 3.5 4.2 3.9  CL 104 102 103  CO2 26 23 25   GLUCOSE 123* 110* 85  BUN 12 10 7   CREATININE 0.88 0.71 0.79  CALCIUM 8.7* 8.5* 8.4*  MG  --  1.9  --    Liver Function Tests:  Recent Labs Lab 11/08/14 2356 11/09/14 1048 11/10/14 0526  AST 57* 55* 57*  ALT 35 35 35  ALKPHOS 65 63 62  BILITOT 0.4 0.4 0.4  PROT 8.1 8.3* 8.6*  ALBUMIN 3.9 3.9 3.9   No results for input(s): LIPASE, AMYLASE in the last 168 hours. No results for input(s): AMMONIA in the last 168 hours. CBC:  Recent Labs Lab 11/08/14 2356 11/09/14 1048 11/10/14 0526  WBC 6.1 5.6 4.4  NEUTROABS 3.7 3.4  --   HGB 11.3* 12.2 12.8  HCT 35.4* 37.8 38.4  MCV 83.3 82.9 83.3  PLT 140* 158 132*   Cardiac Enzymes: No results for input(s): CKTOTAL, CKMB, CKMBINDEX, TROPONINI in the last 168 hours. BNP: BNP (last 3 results) No results for input(s): BNP in the last 8760 hours.  ProBNP (last 3 results) No results for input(s): PROBNP in the last 8760 hours.  CBG:  Recent Labs Lab 11/10/14 0808 11/11/14 0746  GLUCAP 81 84

## 2014-11-11 NOTE — Progress Notes (Signed)
[id number]/Tct-home phone left message with family member for patient to call [phone] [facility] for follow up appointment./Rhonda Davis,RN,BSN,CCM

## 2014-11-11 NOTE — Progress Notes (Addendum)
Initial Nutrition Assessment  DOCUMENTATION CODES:  Underweight, Severe malnutrition in context of chronic illness  INTERVENTION: - Will order Ensure Enlive po BID, each supplement provides 350 kcal and 20 grams of protein - RD will continue to monitor for needs  NUTRITION DIAGNOSIS:  Underweight related to chronic illness as evidenced by other (see comment) (BMI).  GOAL:  Patient will meet greater than or equal to 90% of their needs  MONITOR:  PO intake, Supplement acceptance, Weight trends, Labs, I & O's  REASON FOR ASSESSMENT:  Malnutrition Screening Tool, Consult Diet education  ASSESSMENT: 59 year old female with past medical history of HIV (on HAART), last CD4 about 3 weeks ago was 580, seizures, from ?group home who presented to [facility] ED after she was found lying on the floor after she removed all her clothes. She is not a good historian due to altered mental status. Her mother and caregiver have provided some information to ED over the phone saying they found her lying on the floor but they do not know the details of the events that took place.   Pt seen for MST and consult for diet education; education deferred as no visitors present and pt minimally interactive during discussion. BMI indicates underweight status. Unable to obtain information from pt.  Per chart review, pt has been eating 100% of meals since admission with no supplements ordered; will order Ensure Enlive BID. Per weight hx review, pt has lost 24 lbs (23% body weight) in the past month which is significant for time frame.  Likely meeting needs currently but not PTA. Severe muscle and fat wasting noted. Medications reviewed. Labs reviewed; Ca: 8.4 mg/dL.   Height:  Ht Readings from Last 1 Encounters:  11/11/14 4\' 11"  (1.499 m)    Weight:  Wt Readings from Last 1 Encounters:  11/10/14 82 lb 0.2 oz (37.2 kg)    Ideal Body Weight:  44.1 kg (kg)  Wt Readings from Last 10 Encounters:  11/10/14 82 lb  0.2 oz (37.2 kg)  10/14/14 84 lb (38.102 kg)  09/05/14 89 lb 9.6 oz (40.642 kg)  02/05/14 100 lb (45.36 kg)  11/06/13 104 lb (47.174 kg)  10/08/13 106 lb (48.081 kg)  09/06/13 103 lb 4.8 oz (46.857 kg)  08/30/13 106 lb (48.081 kg)  08/23/13 103 lb (46.72 kg)  04/24/13 99 lb (44.906 kg)    BMI:  Body mass index is 16.56 kg/(m^2).  Estimated Nutritional Needs:  Kcal:  1200-1400  Protein:  45-60 grams  Fluid:  2 L/day  Skin:  Reviewed, no issues  Diet Order:  Diet regular Room service appropriate?: Yes; Fluid consistency:: Thin  EDUCATION NEEDS:  No education needs identified at this time   Intake/Output Summary (Last 24 hours) at 11/11/14 0947 Last data filed at 11/11/14 0819  Gross per 24 hour  Intake    530 ml  Output      0 ml  Net    530 ml    Last BM:  PTA    Jarome Matin, RD, LDN Inpatient Clinical Dietitian Pager # [phone] After hours/weekend pager # [phone]

## 2014-11-11 NOTE — Evaluation (Signed)
Physical Therapy Evaluation Patient Details Name: KAYELYN LEMON MRN: [id number] DOB: [birth year] Today's Date: 11/11/2014   History of Present Illness  59 year old female with past medical history of HIV (on HAART), sustance abuse, seizures, from ?group home admitted with acute encephalopathy  Clinical Impression  Pt admitted with above diagnosis. Pt currently with functional limitations due to the deficits listed below (see PT Problem List).  Pt will benefit from skilled PT to increase their independence and safety with mobility to allow discharge to the venue listed below.   Pt calm and cooperative today.  Pt was able to ambulate in hallway with RW however will not likely need upon d/c as cognition improves.  Pt likely close to her mobility baseline.  Pt unable to state PLOF and home environment.  Recommend 24/7 supervision due to cognition.     Follow Up Recommendations No PT follow up    Equipment Recommendations  Rolling walker with 5" wheels    Recommendations for Other Services       Precautions / Restrictions Precautions Precautions: Fall Precaution Comments: restraints      Mobility  Bed Mobility Overal bed mobility: Needs Assistance Bed Mobility: Supine to Sit;Sit to Supine     Supine to sit: Supervision Sit to supine: Supervision   General bed mobility comments: increased time  Transfers Overall transfer level: Needs assistance Equipment used: Rolling walker (2 wheeled) Transfers: Sit to/from Stand Sit to Stand: Min guard         General transfer comment: close guard for safety however pt did not require physical assist  Ambulation/Gait Ambulation/Gait assistance: Min guard Ambulation Distance (Feet): 160 Feet Assistive device: Rolling walker (2 wheeled) Gait Pattern/deviations: Drifts right/left;Step-through pattern;Narrow base of support     General Gait Details: steady slow pace, did well with RW however tends to drift right and left, no LOB or  unsteadiness observed  Stairs            Wheelchair Mobility    Modified Rankin (Stroke Patients Only)       Balance                                             Pertinent Vitals/Pain Pain Assessment: Faces Faces Pain Scale: Hurts a little bit (shakes head yes to pain but does not appear distressed, did not state location) Pain Intervention(s): Repositioned    Home Living                   Additional Comments: pt with very little verbalizations, did not answer questions    Prior Function           Comments: likely at least ambulatory     Hand Dominance        Extremity/Trunk Assessment               Lower Extremity Assessment: Overall WFL for tasks assessed         Communication   Communication: No difficulties  Cognition Arousal/Alertness: Awake/alert Behavior During Therapy: Flat affect Overall Cognitive Status: No family/caregiver present to determine baseline cognitive functioning Area of Impairment: Following commands       Following Commands: Follows one step commands with increased time            General Comments      Exercises        Assessment/Plan  PT Assessment Patient needs continued PT services  PT Diagnosis Difficulty walking   PT Problem List Decreased mobility;Decreased cognition  PT Treatment Interventions DME instruction;Gait training;Functional mobility training;Patient/family education;Therapeutic activities;Therapeutic exercise   PT Goals (Current goals can be found in the Care Plan section) Acute Rehab PT Goals PT Goal Formulation: With patient Time For Goal Achievement: 11/18/14 Potential to Achieve Goals: Good    Frequency Min 3X/week   Barriers to discharge        Co-evaluation               End of Session   Activity Tolerance: Patient tolerated treatment well Patient left: with restraints reapplied;in bed;with bed alarm set           Time:  0850-0909 PT Time Calculation (min) (ACUTE ONLY): 19 min   Charges:   PT Evaluation $Initial PT Evaluation Tier I: 1 Procedure     PT G Codes:        Alex Mcmanigal,KATHrine E 11/11/2014, 12:18 PM Carmelia Bake, PT, DPT 11/11/2014 Pager: [phone]

## 2014-11-12 LAB — HEMOGLOBIN A1C
HEMOGLOBIN A1C: 5.8 % — AB (ref 4.8–5.6)
MEAN PLASMA GLUCOSE: 120 mg/dL

## 2014-11-19 ENCOUNTER — Encounter: Payer: Self-pay | Admitting: Family Medicine

## 2014-11-19 ENCOUNTER — Ambulatory Visit: Payer: Medicaid Other | Attending: Family Medicine | Admitting: Family Medicine

## 2014-11-19 VITALS — BP 96/66 | HR 84 | Temp 97.8°F | Resp 18 | Ht 60.0 in | Wt 85.6 lb

## 2014-11-19 DIAGNOSIS — G9340 Encephalopathy, unspecified: Secondary | ICD-10-CM

## 2014-11-19 DIAGNOSIS — B351 Tinea unguium: Secondary | ICD-10-CM

## 2014-11-19 DIAGNOSIS — B20 Human immunodeficiency virus [HIV] disease: Secondary | ICD-10-CM | POA: Diagnosis not present

## 2014-11-19 DIAGNOSIS — N390 Urinary tract infection, site not specified: Secondary | ICD-10-CM

## 2014-11-19 DIAGNOSIS — E43 Unspecified severe protein-calorie malnutrition: Secondary | ICD-10-CM

## 2014-11-19 DIAGNOSIS — G40909 Epilepsy, unspecified, not intractable, without status epilepticus: Secondary | ICD-10-CM | POA: Diagnosis not present

## 2014-11-19 LAB — POCT URINALYSIS DIPSTICK
BILIRUBIN UA: NEGATIVE
Blood, UA: NEGATIVE
Glucose, UA: NEGATIVE
KETONES UA: NEGATIVE
NITRITE UA: NEGATIVE
Spec Grav, UA: 1.025
Urobilinogen, UA: 0.2
pH, UA: 6.5

## 2014-11-19 MED ORDER — CIPROFLOXACIN HCL 500 MG PO TABS: 500.0000 mg | ORAL_TABLET | Freq: Two times a day (BID) | ORAL | Status: DC

## 2014-11-19 MED ORDER — LORAZEPAM 1 MG PO TABS: 1.0000 mg | ORAL_TABLET | Freq: Every day | ORAL | Status: DC | PRN

## 2014-11-19 NOTE — Progress Notes (Signed)
Subjective:    Patient ID: [name] [name], female    DOB: 1956-03-04, 59 y.o.   MRN: [id number]  HPI Admit Date: 11/08/14 Discharge Date: 11/11/14  [name] [name] is a 59 year old female with a history of seizures, HIV (CD4 count of 590, on HAART) who presented to the [facility] emergency room after being found on the floor without clothes.  In ED, pt was hemodynamically stable but confused. Her CT head did not reveal acute intracranial findings. Platelets were mildly decreased to 140 and hemoglobin was 11.3 otherwise unremarkable. UA positive for leukocytes and she was placed on IV Rocephin. Keppra was continued for seizures Psychiatry saw the patient in consultation and they recommended continuing Zoloft and adding Ativan and/or Haldol to control agitation. Acute encephalopathy was thought to be secondary to possible HIV encephalopathy versus UTI versus depression. She was switched to [facility] and subsequently discharged.   Interval history:  She has completed her course of antibiotics and remains on Ativan for anxiety which she has been taking three times daily rather than three times daily as needed. Since she got discharged have [place] worker has noticed she has been less competent and performing her ADLs; she would previously be able to make her meals but of recent she has spilled her cereal all over the floor on has also needed frequent reminders about taking her medications. Increased hours is requested to enable the patient cope with her ADLs. Caregiver also request referral to podiatry for clipping of toenails as well as onychomycosis.  Past Medical History  Diagnosis Date  . HIV infection   . Substance abuse   . H/O head injury   . Seizures   . Dementia   . Depression     Past Surgical History  Procedure Laterality Date  . Unremarkable      Family History  Problem Relation Age of Onset  . Colon cancer Neg Hx   . Diabetes Mother   . Behavior problems Mother   .  Headache Mother     History   Social History  . Marital Status: Single    Spouse Name: N/A  . Number of Children: 1  . Years of Education: N/A   Occupational History  .     Social History Main Topics  . Smoking status: Former Research scientist (life sciences)  . Smokeless tobacco: Never Used  . Alcohol Use: No  . Drug Use: 7.00 per week     Comment: crack  . Sexual Activity:    Partners: Male     Comment: pt. given condoms   Other Topics Concern  . Not on file   Social History Narrative   Patient lives at home with her mother.   Caffeine Use: 0    No Known Allergies  Current Outpatient Prescriptions on File Prior to Visit  Medication Sig Dispense Refill  . efavirenz-emtricitabine-tenofovir (ATRIPLA) 600-200-300 MG per tablet Take 1 tablet by mouth at bedtime. 30 tablet 0  . feeding supplement, ENSURE ENLIVE, (ENSURE ENLIVE) LIQD Take 237 mLs by mouth 2 (two) times daily between meals. 237 mL 0  . levETIRAcetam (KEPPRA) 750 MG tablet Take 1 tablet (750 mg total) by mouth 2 (two) times daily. 60 tablet 12  . Multiple Vitamin (MULTIVITAMIN) tablet Take 1 tablet by mouth daily.      . nitrofurantoin, macrocrystal-monohydrate, (MACROBID) 100 MG capsule Take 1 capsule (100 mg total) by mouth 2 (two) times daily. 10 capsule 0  . sertraline (ZOLOFT) 50 MG tablet Take 3 tablets (150  mg total) by mouth daily. 90 tablet 12   No current facility-administered medications on file prior to visit.    Review of Systems  Constitutional: Positive for activity change. Negative for appetite change and fatigue.       ROS obtained from care giver as well as patient is unable to cooperate fully  HENT: Negative for congestion, sinus pressure and sore throat.   Eyes: Negative for visual disturbance.  Respiratory: Negative for cough, chest tightness, shortness of breath and wheezing.   Cardiovascular: Negative for chest pain and palpitations.  Gastrointestinal: Negative for abdominal pain, constipation and abdominal  distention.  Endocrine: Negative for polydipsia.  Genitourinary: Negative for dysuria and frequency.  Musculoskeletal: Negative for back pain and arthralgias.  Skin: Negative for rash.  Neurological: Negative for tremors, light-headedness and numbness.  Hematological: Does not bruise/bleed easily.  Psychiatric/Behavioral: Positive for behavioral problems. Negative for agitation.         Objective: Filed Vitals:   11/19/14 1048  BP: 96/66  Pulse: 84  Temp: 97.8 F (36.6 C)  TempSrc: Oral  Resp: 18  Height: 5' (1.524 m)  Weight: 85 lb 9.6 oz (38.828 kg)  SpO2: 98%        Physical Exam  Constitutional: She appears well-developed. No distress.  thin  HENT:  Head: Normocephalic.  Right Ear: External ear normal.  Left Ear: External ear normal.  Nose: Nose normal.  Mouth/Throat: Oropharynx is clear and moist.  Eyes: Conjunctivae and EOM are normal. Pupils are equal, round, and reactive to light.  Neck: Normal range of motion. No JVD present.  Cardiovascular: Normal rate, regular rhythm, normal heart sounds and intact distal pulses.  Exam reveals no gallop.   No murmur heard. Pulmonary/Chest: Effort normal and breath sounds normal. No respiratory distress. She has no wheezes. She has no rales. She exhibits no tenderness.  Abdominal: Soft. Bowel sounds are normal. She exhibits no distension and no mass. There is no tenderness.  Musculoskeletal: Normal range of motion. She exhibits no edema or tenderness.  Neurological: She is alert.  Not fully oriented.  Skin: Skin is warm and dry. She is not diaphoretic.  Psychiatric:  Flat affect            Assessment & Plan:  59 year old female patient with a history of HIV(CD4 count of 590, currently on antiretrovirals therapy), seizures recently managed for acute encephalopathy.   Acute encephalopathy: Could be secondary to HIV encephalopathy; cannot exclude dementia. Advised to use Ativan sparingly and as needed as the  patient is not really agitated at this time. Referred to psychiatry for evaluation and behavioral health has been called in to see the patient to set up resources for this.  HIV: CD4 count of 590. Continue antiretrovirals therapy and keep appointment with [facility].  Urinary tract infection Recently completed a course of Macrobid and UA still possible for leukocytes. I am placing her on ciprofloxacin and sending off a urine culture.  Seizures: No recent seizure. Continue Keppra. Appointment with neurology is not until next year.  Onychomycosis: Referred to podiatrist for patient's request and also for clipping of toenails.  45 minutes spent in the care of the patient including counseling time.  This note has been created with Surveyor, quantity. Any transcriptional errors are unintentional.

## 2014-11-19 NOTE — Progress Notes (Signed)
ASSESSMENT: Pt currently experiencing symptoms of dementia, with presence of UTI. Pt needs to have further evaluation and to f/u with PCP. Pt would benefit from community resources.  Stage of Change: precontemplative  PLAN: 1. F/U with behavioral health consultant in as needed 2. Psychiatric Medications: Zoloft. 3. Behavioral recommendation(s):   -Consider [place] for additional resources -Consider [facility] for further assessment SUBJECTIVE: Pt. referred by Dr Jarold Song for referral to psych:  Pt. here for referral regarding referral to psych.  Pt. reports the following symptoms/concerns: Pt only concern is that TV at home is not working correctly. Pt's nurse says that pt needs to see psychiatrist for increase in anxiety, and that pt needs to have activities in the community.  Duration of problem: worsening in past two weeks Severity: severe  OBJECTIVE: Orientation & Cognition: Pt disoriented, confused Mood: appropriate, pleasant Affect: appropriate Appearance: appropriate Risk of harm to self or others: no risk of harm to self or others Substance use: none Psychiatric medication use: Unchanged from prior contact. Assessments administered: n/a  Diagnosis: Further evaluation needed CPT Code: N/A -------------------------------------------- Other(s) present in the room: pt home health nurse  Time spent with patient in exam room: 20 minutes

## 2014-11-19 NOTE — Progress Notes (Signed)
Patient is here for hospital follow up. Patient reports she feels fine today. Patient denies any pain.   Patient is accompanied by her caregiver. Caregiver reports she has kind of been out of it since she's been out of the hospital but it has gotten better. Caregiver reports that her memory isn't that well. Caregiver reports she think she needs to see a psychiatrist for her anxiety. Caregiver concerned about the patients mental status decreasing.   Patient had a fall recently on Friday.   Patient has been needing more assistance with ADLs since she has been out of the hospital.

## 2014-11-20 ENCOUNTER — Other Ambulatory Visit: Payer: Self-pay | Admitting: *Deleted

## 2014-11-20 DIAGNOSIS — B20 Human immunodeficiency virus [HIV] disease: Secondary | ICD-10-CM

## 2014-11-20 MED ORDER — EFAVIRENZ-EMTRICITAB-TENOFOVIR 600-200-300 MG PO TABS: 1.0000 | ORAL_TABLET | Freq: Every day | ORAL | Status: DC

## 2014-11-20 NOTE — Progress Notes (Signed)
Quick Note:  Treated ______

## 2014-11-21 LAB — URINE CULTURE: Colony Count: 15000

## 2014-11-22 ENCOUNTER — Inpatient Hospital Stay: Payer: Self-pay

## 2014-11-25 ENCOUNTER — Inpatient Hospital Stay: Payer: Self-pay | Admitting: Family Medicine

## 2014-12-20 ENCOUNTER — Encounter: Payer: Self-pay | Admitting: Family Medicine

## 2014-12-20 ENCOUNTER — Ambulatory Visit: Payer: Medicaid Other | Attending: Family Medicine | Admitting: Family Medicine

## 2014-12-20 VITALS — BP 103/74 | HR 90 | Temp 98.0°F | Resp 16 | Wt 85.4 lb

## 2014-12-20 DIAGNOSIS — F0391 Unspecified dementia with behavioral disturbance: Secondary | ICD-10-CM | POA: Diagnosis not present

## 2014-12-20 DIAGNOSIS — Z87891 Personal history of nicotine dependence: Secondary | ICD-10-CM | POA: Diagnosis not present

## 2014-12-20 DIAGNOSIS — F0390 Unspecified dementia without behavioral disturbance: Secondary | ICD-10-CM | POA: Insufficient documentation

## 2014-12-20 DIAGNOSIS — N390 Urinary tract infection, site not specified: Secondary | ICD-10-CM | POA: Insufficient documentation

## 2014-12-20 DIAGNOSIS — Z0000 Encounter for general adult medical examination without abnormal findings: Secondary | ICD-10-CM

## 2014-12-20 DIAGNOSIS — B20 Human immunodeficiency virus [HIV] disease: Secondary | ICD-10-CM | POA: Diagnosis not present

## 2014-12-20 DIAGNOSIS — R636 Underweight: Secondary | ICD-10-CM

## 2014-12-20 DIAGNOSIS — Z87440 Personal history of urinary (tract) infections: Secondary | ICD-10-CM

## 2014-12-20 DIAGNOSIS — F329 Major depressive disorder, single episode, unspecified: Secondary | ICD-10-CM | POA: Diagnosis not present

## 2014-12-20 DIAGNOSIS — E43 Unspecified severe protein-calorie malnutrition: Secondary | ICD-10-CM

## 2014-12-20 LAB — POCT URINALYSIS DIPSTICK
Bilirubin, UA: NEGATIVE
Blood, UA: NEGATIVE
GLUCOSE UA: NEGATIVE
KETONES UA: NEGATIVE
Nitrite, UA: NEGATIVE
Protein, UA: NEGATIVE
SPEC GRAV UA: 1.02
Urobilinogen, UA: 0.2
pH, UA: 6.5

## 2014-12-20 MED ORDER — ENSURE ENLIVE PO LIQD: 237.0000 mL | Freq: Two times a day (BID) | ORAL | Status: DC

## 2014-12-20 NOTE — Assessment & Plan Note (Signed)
Health care maintenance: schdeule mammogram Dental referral, see dental list

## 2014-12-20 NOTE — Progress Notes (Signed)
Patient here to establish care Patient is here with care giver as patient has a history of  Dementia. Patient also has a history of seizure disorder and  Suffers from depression and anxiety as well Patient is done with the regiment of macrobid so i will obtains A urine sample and Dip for history of UTI

## 2014-12-20 NOTE — Assessment & Plan Note (Signed)
Underweight: Ensure prescription provided please take this to your local pharmacy  Take twice daily 30 minutes before meals

## 2014-12-20 NOTE — Patient Instructions (Addendum)
Ms. Bellisario,  Thank you for coming in today. It was a pleasure meeting you. I look forward to being your primary doctor.  1. Underweight: Ensure prescription provided please take this to your local pharmacy  Take twice daily 30 minutes before meals   2. Recent UTI: resolved with treatment  3. Dementia: continue zoloft,   4. HIV: keep follow up with infectious disease clinic  5. Health care maintenance: schdeule mammogram Dental referral, see dental list  F/u in 3 months with me  Dr. Adrian Blackwater     Dr. Adrian Blackwater

## 2014-12-20 NOTE — Assessment & Plan Note (Signed)
Recent UTI: resolved with treatment. Persistent leukocytes.

## 2014-12-20 NOTE — Progress Notes (Signed)
Subjective:    Patient ID: [name] [name], female    DOB: Oct 14, 1955, 59 y.o.   MRN: [id number] CC: f/u UTI and dementia, meet PCP  HPI 59 yo F with HIV, dementia and seizures presents for f/u visit:  1. UTI: patient has completed macrobid and cipro for recent UTI. Her last urine culture obtained at last OV was negative for a single bacteria. She denies dysuria, fever, chills, flank pain and abdominal pain.   2. Underweight: has a normal appetite. No oral or throat pain when eating. Denies ETOH. Buying ensure with social security benefits.   3. Depression/dementia: patient lives with her 47 yo mother. She feels that her mother prefers for her to stay upstairs in her room. Patient is alone most of the time. She has caregivers at home. She has comes today with her caregiver of 1 year. Patient is taking prozac. She sleeps well. She denies SI.   Social History  Substance Use Topics  . Smoking status: Former Research scientist (life sciences)  . Smokeless tobacco: Never Used  . Alcohol Use: No   Review of Systems  Constitutional: Negative for fever and chills.  Eyes: Negative for visual disturbance.  Respiratory: Negative for shortness of breath.   Cardiovascular: Negative for chest pain.  Gastrointestinal: Negative for nausea, vomiting, abdominal pain and blood in stool.  Genitourinary: Negative for dysuria and flank pain.  Musculoskeletal: Negative for back pain and arthralgias.  Skin: Negative for rash.  Allergic/Immunologic: Negative for immunocompromised state.  Neurological: Negative for dizziness and seizures.  Hematological: Negative for adenopathy. Does not bruise/bleed easily.  Psychiatric/Behavioral: Positive for dysphoric mood. Negative for suicidal ideas.  GAD-7: score of 14. 2-6. 3-1,2,3,7. All others negative      Objective:   Physical Exam  Constitutional: She is oriented to person, place, and time. She appears cachectic. No distress.  HENT:  Mouth/Throat:    Eyes: Conjunctivae and EOM are  normal. Pupils are equal, round, and reactive to light.  Cardiovascular: Normal rate, regular rhythm, normal heart sounds and intact distal pulses.   Pulmonary/Chest: Effort normal and breath sounds normal.  Musculoskeletal: She exhibits no edema.  Neurological: She is alert and oriented to person, place, and time.  Skin: Skin is warm and dry. No rash noted.  Psychiatric: Her speech is normal and behavior is normal. Judgment and thought content normal. Cognition and memory are normal. She exhibits a depressed mood.  Normal speech patient repeats phrases   BP 103/74 mmHg  Pulse 90  Temp(Src) 98 F (36.7 C)  Resp 16  Wt 85 lb 6.4 oz (38.737 kg)  SpO2 100%  Wt Readings from Last 3 Encounters:  12/20/14 85 lb 6.4 oz (38.737 kg)  11/19/14 85 lb 9.6 oz (38.828 kg)  11/10/14 82 lb 0.2 oz (37.2 kg)   Depression screen [facility] 2/9 12/20/2014 10/14/2014 02/05/2014 02/05/2014 11/06/2013  Decreased Interest 1 0 0 0 2  Down, Depressed, Hopeless 3 0 0 0 2  PHQ - 2 Score 4 0 0 0 4  Altered sleeping 0 - - - 0  Tired, decreased energy 3 - - - 2  Change in appetite 0 - - - 0  Feeling bad or failure about yourself  3 - - - 0  Trouble concentrating 3 - - - 2  Moving slowly or fidgety/restless 1 - - - 2  Suicidal thoughts 0 - - - 2  PHQ-9 Score 14 - - - 12         Assessment & Plan:

## 2014-12-24 ENCOUNTER — Telehealth: Payer: Self-pay | Admitting: Family Medicine

## 2014-12-24 NOTE — Telephone Encounter (Signed)
Patient called to request a prescription for Ensure, patient stated that medicaid will not pay for the ensure. Please f/u with pt.

## 2014-12-25 ENCOUNTER — Telehealth: Payer: Self-pay | Admitting: *Deleted

## 2014-12-25 ENCOUNTER — Ambulatory Visit: Payer: Medicaid Other | Admitting: Podiatry

## 2014-12-25 NOTE — Telephone Encounter (Signed)
Pt's mtr states she received a call from the someone stating she had to change her appt, but she doesn't know who, where or when the pt is to be seen and she really needs her toenails clipped.

## 2015-01-14 ENCOUNTER — Encounter: Payer: Self-pay | Admitting: Internal Medicine

## 2015-01-14 ENCOUNTER — Other Ambulatory Visit (HOSPITAL_COMMUNITY)
Admission: RE | Admit: 2015-01-14 | Discharge: 2015-01-14 | Disposition: A | Discharge status: Home / Self Care | Payer: Medicaid Other | Source: Ambulatory Visit | Attending: Internal Medicine | Admitting: Internal Medicine

## 2015-01-14 ENCOUNTER — Other Ambulatory Visit: Payer: Self-pay | Admitting: *Deleted

## 2015-01-14 ENCOUNTER — Ambulatory Visit (INDEPENDENT_AMBULATORY_CARE_PROVIDER_SITE_OTHER): Payer: Medicaid Other | Admitting: Internal Medicine

## 2015-01-14 VITALS — BP 118/80 | HR 92 | Temp 98.2°F | Wt 85.0 lb

## 2015-01-14 DIAGNOSIS — Z113 Encounter for screening for infections with a predominantly sexual mode of transmission: Secondary | ICD-10-CM | POA: Diagnosis not present

## 2015-01-14 DIAGNOSIS — E43 Unspecified severe protein-calorie malnutrition: Secondary | ICD-10-CM

## 2015-01-14 DIAGNOSIS — Z23 Encounter for immunization: Secondary | ICD-10-CM | POA: Diagnosis not present

## 2015-01-14 DIAGNOSIS — R636 Underweight: Secondary | ICD-10-CM

## 2015-01-14 DIAGNOSIS — F1910 Other psychoactive substance abuse, uncomplicated: Secondary | ICD-10-CM | POA: Insufficient documentation

## 2015-01-14 DIAGNOSIS — B20 Human immunodeficiency virus [HIV] disease: Secondary | ICD-10-CM | POA: Diagnosis present

## 2015-01-14 MED ORDER — ELVITEG-COBIC-EMTRICIT-TENOFAF 150-150-200-10 MG PO TABS: 1.0000 | ORAL_TABLET | Freq: Every day | ORAL | Status: DC

## 2015-01-14 MED ORDER — ENSURE ENLIVE PO LIQD: 237.0000 mL | Freq: Two times a day (BID) | ORAL | Status: DC

## 2015-01-14 NOTE — Assessment & Plan Note (Signed)
She seems to be doing good despite drug use, will change to [facility].  :Labs today and rtc 3 months unless concerns.

## 2015-01-14 NOTE — Assessment & Plan Note (Signed)
She was seen by our [place] counselor today and will follow up with her.

## 2015-01-14 NOTE — Progress Notes (Signed)
  Subjective:    Patient ID: [name] [name], female    DOB: 1956/02/27, 59 y.o.   MRN: [id number]  HPI She comes in for followup of her HIV. She has been on Atripla and denies any missed doses. She is given her medications by her mother and her home aide is with her today.  She continues to have weight loss and in discussion with the nurse, endorsed continued crack use.  No diarrhea, no rash.       Review of Systems  Constitutional: Negative for fatigue and unexpected weight change.  HENT: Negative for ear discharge, sore throat and trouble swallowing.   Eyes: Negative for photophobia, pain, discharge, redness, itching and visual disturbance.  Respiratory: Negative for cough and shortness of breath.   Cardiovascular: Negative for chest pain.  Gastrointestinal: Negative for nausea and diarrhea.  Musculoskeletal: Negative for myalgias and arthralgias.  Skin: Negative for rash.  Neurological: Negative for dizziness and headaches.  Hematological: Negative for adenopathy.  Psychiatric/Behavioral: Negative for dysphoric mood.       Objective:   Physical Exam  Constitutional: She appears well-developed and well-nourished. No distress.  HENT:  Mouth/Throat: No oropharyngeal exudate.  Eyes: Conjunctivae are normal. Right eye exhibits no discharge. Left eye exhibits no discharge. No scleral icterus.  Cardiovascular: Normal rate, regular rhythm and normal heart sounds.   No murmur heard. Pulmonary/Chest: Effort normal and breath sounds normal. No respiratory distress. She has no wheezes.  Lymphadenopathy:    She has no cervical adenopathy.    She has no axillary adenopathy.       Right: No supraclavicular adenopathy present.       Left: No supraclavicular adenopathy present.  Skin: No rash noted.          Assessment & Plan:

## 2015-01-14 NOTE — Assessment & Plan Note (Signed)
Continues to have significant weight loss.  Likely drug use.  Will give ensure and encourage intake.

## 2015-01-15 LAB — T-HELPER CELL (CD4) - (RCID CLINIC ONLY)
CD4 % Helper T Cell: 28 % — ABNORMAL LOW (ref 33–55)
CD4 T Cell Abs: 580 /uL (ref 400–2700)

## 2015-01-15 LAB — RPR

## 2015-01-16 LAB — URINE CYTOLOGY ANCILLARY ONLY
CHLAMYDIA, DNA PROBE: NEGATIVE
Neisseria Gonorrhea: NEGATIVE

## 2015-01-19 LAB — HIV-1 RNA QUANT-NO REFLEX-BLD

## 2015-01-21 ENCOUNTER — Ambulatory Visit: Payer: Self-pay | Admitting: *Deleted

## 2015-01-28 ENCOUNTER — Other Ambulatory Visit: Payer: Self-pay | Admitting: Internal Medicine

## 2015-01-28 ENCOUNTER — Ambulatory Visit: Payer: Medicaid Other | Admitting: *Deleted

## 2015-01-28 DIAGNOSIS — E43 Unspecified severe protein-calorie malnutrition: Secondary | ICD-10-CM

## 2015-01-28 DIAGNOSIS — F32A Depression, unspecified: Secondary | ICD-10-CM

## 2015-01-28 DIAGNOSIS — F329 Major depressive disorder, single episode, unspecified: Secondary | ICD-10-CM

## 2015-01-28 DIAGNOSIS — F1010 Alcohol abuse, uncomplicated: Secondary | ICD-10-CM

## 2015-01-28 DIAGNOSIS — R636 Underweight: Secondary | ICD-10-CM

## 2015-01-28 MED ORDER — ENSURE ENLIVE PO LIQD: 237.0000 mL | Freq: Two times a day (BID) | ORAL | Status: DC

## 2015-01-28 NOTE — BH Specialist Note (Signed)
[name] [name] was present for her scheduled appointment today.  Client was a little confused at time and did not appear to be fully alert to what was going on around her.  When asked how old she is by counselor, client said 73.  Client is actually 59 years old. Client was a little slow responding and counselor had to often repeat the question and or comment made.  Client was calm and had a pleasant disposition. Client answered counselor questions cooperatively once she understood what was being asked.  Client indicated that she has not drank anything and was not using any type of substances other than what was prescribed.  Client said that she was happy and doing well. Client was not sad and gave good eye contact.  Client did complain about her right eye not being able to see well. Counselor retrieved a nurse to look at client's eye to make sure she was ok.  The nurse checked client's eye out and said that they would look at it again during her next doctor's appointment.  Counselor provided support and encouragement to client.  Client made another appointment to check in with counselor in one month.   Rolena Infante, LPCA, MA [facility]

## 2015-01-28 NOTE — Telephone Encounter (Signed)
Cheree Ditto nurse calling from [facility] called wanting to get a prescription for this patient for ensure. I printed the prescription and faxed it to [facility]

## 2015-01-29 ENCOUNTER — Ambulatory Visit (INDEPENDENT_AMBULATORY_CARE_PROVIDER_SITE_OTHER): Payer: Medicaid Other | Admitting: Podiatry

## 2015-01-29 ENCOUNTER — Encounter: Payer: Self-pay | Admitting: Podiatry

## 2015-01-29 VITALS — BP 99/69 | HR 86 | Resp 12

## 2015-01-29 DIAGNOSIS — M79676 Pain in unspecified toe(s): Secondary | ICD-10-CM

## 2015-01-29 DIAGNOSIS — B351 Tinea unguium: Secondary | ICD-10-CM

## 2015-01-29 NOTE — Progress Notes (Signed)
   Subjective:    Patient ID: [name] [name], female    DOB: 11-14-1955, 59 y.o.   MRN: [id number]  HPI   This confused nonresponsive patient presents today with her CMA who is concerned about her clients elongated discolored toenails that make shoe wearing uncomfortable. CMA has not trim the nails or has she had any other professional treatment. The nails have become thicker and longer slowly over time.  Review of Systems  Eyes: Positive for pain, redness and itching.  Skin: Positive for color change.  Neurological: Positive for dizziness, seizures, weakness and light-headedness.  Psychiatric/Behavioral: Positive for confusion.       Objective:   Physical Exam  Patient is confused and nonresponsive  Vascular: No peripheral edema noted bilaterally DP pulses 2/4 bilaterally PT pulses 0/4 bilaterally Capillary reflex immediate bilaterally  Neurological: Ankle reflexes weakly reactive bilaterally Sensation to 10 g monofilament wire patient not able to respond Sensation to tuning fork patient not able to   Neurological: Dry scaling skinbilaterally The toenails are elongated, brittle, incurvated, discolored 6-10 No open skin lesions bilaterally  Musculoskeletal: Pes planus bilaterally HAV deformities bilaterally This no restriction in the ankle, subtalar, midtarsal joints bilaterally      Assessment & Plan:  Assessment: Confused patient Minich posterior tibial pulses bilaterally Neglected mycotic toenails 6-10  Plan: Debrided toenails 10 mechanically unlikely without a bleeding  Reappoint when necessary at the request of CMA

## 2015-01-29 NOTE — Patient Instructions (Signed)
Return as needed for debridement of thick deformed toenails. Minimum debridement suggested approximately 6 month intervals

## 2015-02-07 ENCOUNTER — Telehealth: Payer: Self-pay | Admitting: Family Medicine

## 2015-02-07 NOTE — Telephone Encounter (Signed)
Pt's CNA calling to ask advice regarding pt feeling disorientated.  Symptoms started several days ago but have been worsening rapidly.  Pt was scheduled for appt for next available aptp on 02/10/15 but CNA thinks patient needs sooner appt. Please f/u with pt.

## 2015-02-10 ENCOUNTER — Ambulatory Visit: Payer: Self-pay | Admitting: Family Medicine

## 2015-02-10 ENCOUNTER — Emergency Department (HOSPITAL_COMMUNITY)
Admission: EM | Admit: 2015-02-10 | Discharge: 2015-02-10 | Disposition: A | Discharge status: Home / Self Care | Payer: Medicaid Other | Attending: Emergency Medicine | Admitting: Emergency Medicine

## 2015-02-10 ENCOUNTER — Encounter (HOSPITAL_COMMUNITY): Payer: Self-pay | Admitting: *Deleted

## 2015-02-10 ENCOUNTER — Emergency Department (HOSPITAL_COMMUNITY): Payer: Medicaid Other

## 2015-02-10 DIAGNOSIS — F0390 Unspecified dementia without behavioral disturbance: Secondary | ICD-10-CM | POA: Diagnosis not present

## 2015-02-10 DIAGNOSIS — B20 Human immunodeficiency virus [HIV] disease: Secondary | ICD-10-CM | POA: Insufficient documentation

## 2015-02-10 DIAGNOSIS — Z87828 Personal history of other (healed) physical injury and trauma: Secondary | ICD-10-CM | POA: Diagnosis not present

## 2015-02-10 DIAGNOSIS — F329 Major depressive disorder, single episode, unspecified: Secondary | ICD-10-CM | POA: Diagnosis not present

## 2015-02-10 DIAGNOSIS — G40909 Epilepsy, unspecified, not intractable, without status epilepticus: Secondary | ICD-10-CM | POA: Insufficient documentation

## 2015-02-10 DIAGNOSIS — R4182 Altered mental status, unspecified: Secondary | ICD-10-CM | POA: Insufficient documentation

## 2015-02-10 DIAGNOSIS — Z79899 Other long term (current) drug therapy: Secondary | ICD-10-CM | POA: Insufficient documentation

## 2015-02-10 DIAGNOSIS — Z87891 Personal history of nicotine dependence: Secondary | ICD-10-CM | POA: Diagnosis not present

## 2015-02-10 DIAGNOSIS — R569 Unspecified convulsions: Secondary | ICD-10-CM

## 2015-02-10 LAB — CBC
HEMATOCRIT: 36.9 % (ref 36.0–46.0)
HEMOGLOBIN: 12 g/dL (ref 12.0–15.0)
MCH: 27.7 pg (ref 26.0–34.0)
MCHC: 32.5 g/dL (ref 30.0–36.0)
MCV: 85.2 fL (ref 78.0–100.0)
Platelets: 147 10*3/uL — ABNORMAL LOW (ref 150–400)
RBC: 4.33 MIL/uL (ref 3.87–5.11)
RDW: 14 % (ref 11.5–15.5)
WBC: 6.3 10*3/uL (ref 4.0–10.5)

## 2015-02-10 LAB — COMPREHENSIVE METABOLIC PANEL
ALBUMIN: 4 g/dL (ref 3.5–5.0)
ALT: 40 U/L (ref 14–54)
ANION GAP: 6 (ref 5–15)
AST: 63 U/L — AB (ref 15–41)
Alkaline Phosphatase: 54 U/L (ref 38–126)
BUN: 14 mg/dL (ref 6–20)
CHLORIDE: 107 mmol/L (ref 101–111)
CO2: 28 mmol/L (ref 22–32)
Calcium: 8.9 mg/dL (ref 8.9–10.3)
Creatinine, Ser: 0.87 mg/dL (ref 0.44–1.00)
GFR calc Af Amer: >60 mL/min (ref >60)
GFR calc non Af Amer: >60 mL/min (ref >60)
GLUCOSE: 129 mg/dL — AB (ref 65–99)
POTASSIUM: 4.1 mmol/L (ref 3.5–5.1)
SODIUM: 141 mmol/L (ref 135–145)
TOTAL PROTEIN: 8.6 g/dL — AB (ref 6.5–8.1)
Total Bilirubin: 0.5 mg/dL (ref 0.3–1.2)

## 2015-02-10 LAB — URINE MICROSCOPIC-ADD ON

## 2015-02-10 LAB — URINALYSIS, ROUTINE W REFLEX MICROSCOPIC
Bilirubin Urine: NEGATIVE
GLUCOSE, UA: NEGATIVE mg/dL
Hgb urine dipstick: NEGATIVE
Ketones, ur: NEGATIVE mg/dL
Nitrite: NEGATIVE
PROTEIN: NEGATIVE mg/dL
SPECIFIC GRAVITY, URINE: 1.03 (ref 1.005–1.030)
Urobilinogen, UA: 1 mg/dL (ref 0.0–1.0)
pH: 6.5 (ref 5.0–8.0)

## 2015-02-10 LAB — CBG MONITORING, ED: Glucose-Capillary: 136 mg/dL — ABNORMAL HIGH (ref 65–99)

## 2015-02-10 LAB — AMMONIA: AMMONIA: 20 umol/L (ref 9–35)

## 2015-02-10 MED ORDER — SODIUM CHLORIDE 0.9 % IV BOLUS (SEPSIS)
500.0000 mL | Freq: Once | INTRAVENOUS | Status: AC
  Administered 2015-02-10: 500 mL via INTRAVENOUS

## 2015-02-10 MED ORDER — LORAZEPAM 1 MG PO TABS: 1.0000 mg | ORAL_TABLET | Freq: Three times a day (TID) | ORAL | Status: DC | PRN

## 2015-02-10 MED ORDER — LEVETIRACETAM 500 MG PO TABS
500.0000 mg | ORAL_TABLET | Freq: Once | ORAL | Status: AC
  Administered 2015-02-10: 500 mg via ORAL
  Filled 2015-02-10: qty 1

## 2015-02-10 NOTE — ED Notes (Signed)
Caregiver reported that pt has increase confusion since last week. Caregiver denies pt having urinary problems. Pt having blank stare episodes.

## 2015-02-10 NOTE — ED Provider Notes (Signed)
CSN: [id number]     Arrival date & time 02/10/15  1128 History   First MD Initiated Contact with Patient 02/10/15 1459     Chief Complaint  Patient presents with  . Altered Mental Status     (Consider location/radiation/quality/duration/timing/severity/associated sxs/prior Treatment) HPI Comments: The pt is from home - has a caregiver here with her - she has no ability to communicate b/c of AMS and possibloe dementia - she has HIV (CD4 recently > 500), she has recently been admitted to the hospital with AMS and was thought to be partially due to HIV.    The caregiver reports that over the last 3 days she has not been eating dinner, she has not been taking her nighttime medications including her HIV medications and possibly her Keppra. This morning the patient was found in her bed, there was some blood on the pillow, she had had some jerking movements earlier yesterday, she was not evaluated for these bouts of seizure-like activity. She has had increased confusion, she will leave the house and go on a walk not returning until midnight, she denies that the patient has had any fevers, chills, vomiting, diarrhea, dysuria or any other physical complaints. She was coughing occasionally this morning but that has gone away. This does not happen infrequently where she becomes altered, oftentimes it is a urinary infection.  Patient is a 59 y.o. female presenting with altered mental status. The history is provided by the EMS personnel, a caregiver and medical records.  Altered Mental Status   Past Medical History  Diagnosis Date  . HIV infection ([place])   . Substance abuse   . H/O head injury   . Seizures ([place])   . Dementia   . Depression    Past Surgical History  Procedure Laterality Date  . Unremarkable     Family History  Problem Relation Age of Onset  . Colon cancer Neg Hx   . Diabetes Mother   . Behavior problems Mother   . Headache Mother    Social History  Substance Use Topics  .  Smoking status: Former Research scientist (life sciences)  . Smokeless tobacco: Never Used  . Alcohol Use: No   OB History    No data available     Review of Systems  Unable to perform ROS: Dementia      Allergies  Review of patient's allergies indicates no known allergies.  Home Medications   Prior to Admission medications   Medication Sig Start Date End Date Taking? Authorizing Provider  elvitegravir-cobicistat-emtricitabine-tenofovir (GENVOYA) 150-150-200-10 MG TABS tablet Take 1 tablet by mouth daily. 01/14/15  Yes Thayer Headings, MD  feeding supplement, ENSURE ENLIVE, (ENSURE ENLIVE) LIQD Take 237 mLs by mouth 2 (two) times daily between meals. ICD 10 code: E43, R63.6 01/28/15  Yes Thayer Headings, MD  levETIRAcetam (KEPPRA) 750 MG tablet Take 1 tablet (750 mg total) by mouth 2 (two) times daily. 09/06/14  Yes Penni Bombard, MD  Multiple Vitamin (MULTIVITAMIN) tablet Take 1 tablet by mouth daily.     Yes Historical Provider, MD  sertraline (ZOLOFT) 50 MG tablet Take 3 tablets (150 mg total) by mouth daily. 10/20/14  Yes Penni Bombard, MD  LORazepam (ATIVAN) 1 MG tablet Take 1 tablet (1 mg total) by mouth every 8 (eight) hours as needed (agitation). 02/10/15   Noemi Chapel, MD   BP 122/81 mmHg  Pulse 67  Temp(Src) 97.4 F (36.3 C) (Axillary)  Resp 12  SpO2 95% Physical Exam  Constitutional: She  appears well-developed and well-nourished. No distress.  HENT:  Head: Normocephalic and atraumatic.  Mouth/Throat: Oropharynx is clear and moist. No oropharyngeal exudate.  Spots on the tongue with areas of injury and bruising  Eyes: Conjunctivae and EOM are normal. Pupils are equal, round, and reactive to light. Right eye exhibits no discharge. Left eye exhibits no discharge. No scleral icterus.  Neck: Normal range of motion. Neck supple. No JVD present. No thyromegaly present.  Cardiovascular: Normal rate, regular rhythm, normal heart sounds and intact distal pulses.  Exam reveals no gallop and no  friction rub.   No murmur heard. Pulmonary/Chest: Effort normal and breath sounds normal. No respiratory distress. She has no wheezes. She has no rales.  Abdominal: Soft. Bowel sounds are normal. She exhibits no distension and no mass. There is no tenderness.  Musculoskeletal: Normal range of motion. She exhibits no edema or tenderness.  Generalized muscle atrophy, moves all 4 extremities spontaneously but does not follow commands  Lymphadenopathy:    She has no cervical adenopathy.  Neurological: She is alert. Coordination normal.  The patient is able to say her name but she does not follow commands other than opening her mouth. She looks around with a blank stare, she will not do grips, and will not lift her legs, when her legs are lifted and dropped she does not let them hit the bed.  Skin: Skin is warm and dry. No rash noted. No erythema.  Psychiatric: She has a normal mood and affect. Her behavior is normal.  Nursing note and vitals reviewed.   ED Course  Procedures (including critical care time) Labs Review Labs Reviewed  COMPREHENSIVE METABOLIC PANEL - Abnormal; Notable for the following:    Glucose, Bld 129 (*)    Total Protein 8.6 (*)    AST 63 (*)    All other components within normal limits  CBC - Abnormal; Notable for the following:    Platelets 147 (*)    All other components within normal limits  URINALYSIS, ROUTINE W REFLEX MICROSCOPIC (NOT AT [facility]) - Abnormal; Notable for the following:    Color, Urine AMBER (*)    APPearance CLOUDY (*)    Leukocytes, UA MODERATE (*)    All other components within normal limits  URINE MICROSCOPIC-ADD ON - Abnormal; Notable for the following:    Squamous Epithelial / LPF FEW (*)    Bacteria, UA FEW (*)    All other components within normal limits  CBG MONITORING, ED - Abnormal; Notable for the following:    Glucose-Capillary 136 (*)    All other components within normal limits  AMMONIA    Imaging Review Dg Chest Port 1  View  02/10/2015   CLINICAL DATA:  Pt's caregiver reports pt has had AMS x 3 days. States she would not answer questions. Has history of of dementia. Also states that Pt has had cough x 3 days "coughing like she is trying to get sick"  EXAM: PORTABLE CHEST 1 VIEW  COMPARISON:  None.  FINDINGS: The heart size and mediastinal contours are within normal limits. Both lungs are clear. The visualized skeletal structures are unremarkable.  IMPRESSION: No active disease.   Electronically Signed   By: Nolon Nations M.D.   On: 02/10/2015 15:50   I have personally reviewed and evaluated these images and lab results as part of my medical decision-making.   EKG Interpretation   Date/Time:  Monday February 10 2015 15:55:56 EDT Ventricular Rate:  66 PR Interval:  127  QRS Duration: 72 QT Interval:  422 QTC Calculation: 442 R Axis:   83 Text Interpretation:  Sinus rhythm Nonspecific T abnormalities, lateral  leads since last tracing no significant change Confirmed by Sabra Heck  MD,  Nimisha Rathel ([id number]) on 02/10/2015 4:33:30 PM      MDM   Final diagnoses:  Altered mental status, unspecified altered mental status type  Seizure ([place])    The patient does not have any sources of infection clinically, there is no fever or tachycardia hypotension or hypoxia. She appears very comfortable but also appears confused and altered. According to the caregiver she is usually able to talk and carry on simple conversations at least answering her name and stating her birthdate which she is not able to do today. I suspect that she has had some intermittent seizures as is evidenced by the blood on her pillow and tongue biting, she may have also had other changes in a metabolic or infectious etiologies, check urinalysis, labs, blood glucose, no indication for CT scan imaging of the brain as it would be unlikely to yield an answer at this time.  No more seizures - calm and cooperative, no acute findings on labs - pt stable for d/c -  has been given Keppra and fluids - culture the urine.  Caregiver given plan and in agreement.  Will come over for next week to given night time meds to ensure she gets them.  She has not been getting the ativan for agitation b/c mother won't give it.  Noemi Chapel, MD 02/10/15 [phone]

## 2015-02-10 NOTE — ED Notes (Addendum)
Pt's caregiver reports pt has had AMS x 3 days.  States she would not answer questions.  Has hx of dementia.  She reports noticing blood on her pillow last night.  Is unsure whether it came from pt's mouth. Pt would not answer questions but only nods and says yes or no.  Pt has hx of UTI.

## 2015-02-10 NOTE — ED Notes (Signed)
In triage pt attempted to collect urine sample and missed hat.

## 2015-02-10 NOTE — ED Notes (Signed)
Awake. Verbally responsive. A/O x4. Resp even and unlabored. No audible adventitious breath sounds noted. ABC's intact. SR on monitor. Caregiver at bedside.

## 2015-02-10 NOTE — ED Notes (Addendum)
Awake. Verbally responsive. A/O x4. Resp even and unlabored. No audible adventitious breath sounds noted. ABC's intact. SR on monitor. IV SL patent and intact. 

## 2015-02-10 NOTE — Discharge Instructions (Signed)

## 2015-02-10 NOTE — ED Notes (Signed)
Awake. Verbally responsive. A/O x4. Resp even and unlabored. No audible adventitious breath sounds noted. ABC's intact. SR on monitor. IV infusing NS at 932ml/hr without difficulty. Caregiver at bedside.

## 2015-02-16 ENCOUNTER — Encounter (HOSPITAL_COMMUNITY): Payer: Self-pay | Admitting: Emergency Medicine

## 2015-02-16 ENCOUNTER — Emergency Department (HOSPITAL_COMMUNITY): Payer: Medicaid Other

## 2015-02-16 ENCOUNTER — Emergency Department (HOSPITAL_COMMUNITY)
Admission: EM | Admit: 2015-02-16 | Discharge: 2015-02-16 | Disposition: A | Discharge status: Home / Self Care | Payer: Medicaid Other | Attending: Emergency Medicine | Admitting: Emergency Medicine

## 2015-02-16 DIAGNOSIS — G40909 Epilepsy, unspecified, not intractable, without status epilepticus: Secondary | ICD-10-CM | POA: Insufficient documentation

## 2015-02-16 DIAGNOSIS — R5383 Other fatigue: Secondary | ICD-10-CM | POA: Diagnosis present

## 2015-02-16 DIAGNOSIS — F329 Major depressive disorder, single episode, unspecified: Secondary | ICD-10-CM | POA: Diagnosis not present

## 2015-02-16 DIAGNOSIS — Z87891 Personal history of nicotine dependence: Secondary | ICD-10-CM | POA: Diagnosis not present

## 2015-02-16 DIAGNOSIS — R410 Disorientation, unspecified: Secondary | ICD-10-CM | POA: Diagnosis not present

## 2015-02-16 DIAGNOSIS — Z79899 Other long term (current) drug therapy: Secondary | ICD-10-CM | POA: Insufficient documentation

## 2015-02-16 DIAGNOSIS — B20 Human immunodeficiency virus [HIV] disease: Secondary | ICD-10-CM | POA: Insufficient documentation

## 2015-02-16 DIAGNOSIS — F0390 Unspecified dementia without behavioral disturbance: Secondary | ICD-10-CM | POA: Insufficient documentation

## 2015-02-16 DIAGNOSIS — Z87828 Personal history of other (healed) physical injury and trauma: Secondary | ICD-10-CM | POA: Diagnosis not present

## 2015-02-16 LAB — URINALYSIS, ROUTINE W REFLEX MICROSCOPIC
BILIRUBIN URINE: NEGATIVE
Glucose, UA: NEGATIVE mg/dL
Hgb urine dipstick: NEGATIVE
KETONES UR: NEGATIVE mg/dL
LEUKOCYTES UA: NEGATIVE
NITRITE: NEGATIVE
PROTEIN: NEGATIVE mg/dL
Specific Gravity, Urine: 1.022 (ref 1.005–1.030)
UROBILINOGEN UA: 0.2 mg/dL (ref 0.0–1.0)
pH: 6 (ref 5.0–8.0)

## 2015-02-16 LAB — CBC WITH DIFFERENTIAL/PLATELET
BASOS ABS: 0.1 10*3/uL (ref 0.0–0.1)
BASOS PCT: 1 %
EOS ABS: 0.1 10*3/uL (ref 0.0–0.7)
Eosinophils Relative: 2 %
HCT: 38 % (ref 36.0–46.0)
Hemoglobin: 13 g/dL (ref 12.0–15.0)
Lymphocytes Relative: 33 %
Lymphs Abs: 1.8 10*3/uL (ref 0.7–4.0)
MCH: 28.5 pg (ref 26.0–34.0)
MCHC: 34.2 g/dL (ref 30.0–36.0)
MCV: 83.3 fL (ref 78.0–100.0)
MONO ABS: 0.4 10*3/uL (ref 0.1–1.0)
Monocytes Relative: 8 %
NEUTROS PCT: 56 %
Neutro Abs: 3.1 10*3/uL (ref 1.7–7.7)
PLATELETS: 127 10*3/uL — AB (ref 150–400)
RBC: 4.56 MIL/uL (ref 3.87–5.11)
RDW: 13.5 % (ref 11.5–15.5)
WBC: 5.5 10*3/uL (ref 4.0–10.5)

## 2015-02-16 LAB — COMPREHENSIVE METABOLIC PANEL
ALK PHOS: 55 U/L (ref 38–126)
ALT: 39 U/L (ref 14–54)
ANION GAP: 9 (ref 5–15)
AST: 60 U/L — ABNORMAL HIGH (ref 15–41)
Albumin: 4.3 g/dL (ref 3.5–5.0)
BILIRUBIN TOTAL: 0.6 mg/dL (ref 0.3–1.2)
BUN: 12 mg/dL (ref 6–20)
CO2: 28 mmol/L (ref 22–32)
CREATININE: 0.74 mg/dL (ref 0.44–1.00)
Calcium: 9.5 mg/dL (ref 8.9–10.3)
Chloride: 102 mmol/L (ref 101–111)
GFR calc Af Amer: >60 mL/min (ref >60)
Glucose, Bld: 83 mg/dL (ref 65–99)
Potassium: 4.2 mmol/L (ref 3.5–5.1)
SODIUM: 139 mmol/L (ref 135–145)
TOTAL PROTEIN: 9 g/dL — AB (ref 6.5–8.1)

## 2015-02-16 LAB — ETHANOL

## 2015-02-16 LAB — LACTIC ACID, PLASMA: LACTIC ACID, VENOUS: 0.7 mmol/L (ref 0.5–2.0)

## 2015-02-16 LAB — AMMONIA: Ammonia: 15 umol/L (ref 9–35)

## 2015-02-16 MED ORDER — SODIUM CHLORIDE 0.9 % IV SOLN
1000.0000 mL | INTRAVENOUS | Status: DC
  Administered 2015-02-16: 1000 mL via INTRAVENOUS

## 2015-02-16 MED ORDER — LORAZEPAM 1 MG PO TABS
1.0000 mg | ORAL_TABLET | Freq: Once | ORAL | Status: AC
  Administered 2015-02-16: 1 mg via ORAL
  Filled 2015-02-16: qty 1

## 2015-02-16 MED ORDER — ELVITEG-COBIC-EMTRICIT-TENOFAF 150-150-200-10 MG PO TABS
1.0000 | ORAL_TABLET | Freq: Every day | ORAL | Status: DC
  Administered 2015-02-16: 1 via ORAL
  Filled 2015-02-16 (×2): qty 1

## 2015-02-16 MED ORDER — SODIUM CHLORIDE 0.9 % IV SOLN
1000.0000 mL | Freq: Once | INTRAVENOUS | Status: AC
  Administered 2015-02-16: 1000 mL via INTRAVENOUS

## 2015-02-16 NOTE — Discharge Instructions (Signed)
Case manager will be contacting you tomorrow to arrange assistance at home. Today's lab workup without any indications for admission. Return for any new or worse symptoms. Also recommend follow-up with primary care doctor.

## 2015-02-16 NOTE — ED Notes (Signed)
Dr. Zackowski at bedside  

## 2015-02-16 NOTE — ED Notes (Signed)
Pt arrived via EMS with report noncompliance with medication. Caregiver reported pt only taking medication when she is available. Pt not drinking or eating as much as usual. Pt c/o lt side of mouth pain. Denies fever, seizure act since last week, coughing, n/v/d, or c/o abd pain. Pt was dx with anxiety and pt not taking medication.

## 2015-02-16 NOTE — ED Notes (Addendum)
Pt taken to x-ray and CT scan.

## 2015-02-16 NOTE — ED Notes (Signed)
Attempted neuro-checks but pt will not follow directions.

## 2015-02-16 NOTE — ED Notes (Signed)
Bed: MV36 Expected date: 02/16/15 Expected time: 1:53 PM Means of arrival: Ambulance Comments: UTI lethargic, not taking meds

## 2015-02-16 NOTE — ED Notes (Signed)
Awake. Verbally responsive. A/O. Resp even and unlabored. No audible adventitious breath sounds noted. ABC's intact. SR on monitor. IV infusing NS at 530ml/hr without difficulty. Caregiver at bedside.

## 2015-02-16 NOTE — ED Notes (Signed)
Attempted to do neuro-checks again but pt will not follow directions. Pt did raise arms. Pt would not answer questions. Pt awake and verbally responsive. Resp even and unlabored. Pt did responds "What" when called her name.

## 2015-02-16 NOTE — ED Provider Notes (Signed)
CSN: [id number]     Arrival date & time 02/16/15  1347 History   First MD Initiated Contact with Patient 02/16/15 1404     Chief Complaint  Patient presents with  . Fatigue     (Consider location/radiation/quality/duration/timing/severity/associated sxs/prior Treatment) The history is provided by a caregiver.     [name] [name] is a 59 y.o. female who presents for evaluation of altered mental status, weakness, and not taking her current medication. She was evaluated, for similar problems several days ago, and discharged with reassurance. At that time it was suspected that she may have had seizures contributing to her altered mental status. Since then. The caregiver was with her today has been trying to help her take her medications. Caregiver is there about 8 hours each day.When the caregiver is not there, the patient's mother has difficulty getting her to take the medication. The mother and the caregiver feels that the patient is worsening, and are concerned that if she is not admitted, she will get even worse. The patient has an appointment tomorrow with her PCP, but since she was worsened today, she was brought here for treatment. Apparently the patient has "mild dementia", and has been worsening and becoming less and less verbal over the last 6 months or so.  Level V caveat- altered mental status   Past Medical History  Diagnosis Date  . HIV infection ([place])   . Substance abuse   . H/O head injury   . Seizures ([place])   . Dementia   . Depression    Past Surgical History  Procedure Laterality Date  . Unremarkable     Family History  Problem Relation Age of Onset  . Colon cancer Neg Hx   . Diabetes Mother   . Behavior problems Mother   . Headache Mother    Social History  Substance Use Topics   . Smoking status: Former Research scientist (life sciences)  . Smokeless tobacco: Never Used  . Alcohol Use: No   OB History    No data available     Review of Systems  Unable to perform ROS     Allergies  Review of patient's allergies indicates no known allergies.  Home Medications   Prior to Admission medications   Medication Sig Start Date End Date Taking? Authorizing Provider  elvitegravir-cobicistat-emtricitabine-tenofovir (GENVOYA) 150-150-200-10 MG TABS tablet Take 1 tablet by mouth daily. 01/14/15  Yes Thayer Headings, MD  feeding supplement, ENSURE ENLIVE, (ENSURE ENLIVE) LIQD Take 237 mLs by mouth 2 (two) times daily between meals. ICD 10 code: E43, R63.6 Patient not taking: Reported on 02/24/2015 01/28/15  Yes Thayer Headings, MD  levETIRAcetam (KEPPRA) 750 MG tablet Take 1 tablet (750 mg total) by mouth 2 (two) times daily. 09/06/14  Yes Penni Bombard, MD  LORazepam (ATIVAN) 1 MG tablet Take 1 tablet (1 mg total) by mouth every 8 (eight) hours as needed (agitation). 02/10/15  Yes Noemi Chapel, MD  Multiple Vitamin (MULTIVITAMIN) tablet Take 1 tablet by mouth daily.     Yes Historical Provider, MD  sertraline (ZOLOFT) 50 MG tablet Take 3 tablets (150 mg total) by mouth daily. 10/20/14  Yes Vikram R Penumalli, MD   BP 135/86 mmHg  Pulse 79  Temp(Src) 98.7 F (37.1 C) (Oral)  Resp 15  Ht 5' (1.524 m)  Wt 85 lb (38.556 kg)  BMI 16.60 kg/m2  SpO2 99% Physical Exam  Constitutional: She appears well-developed.  Frail, appears older than stated age.  HENT:  Head: Normocephalic and  atraumatic.  Right Ear: External ear normal.  Left Ear: External ear normal.  Patient does not open mouth at request. She clenchs her jaws shut. I did not think that this represents trismus.  Eyes: Conjunctivae and EOM are normal. Pupils are equal, round, and reactive to light.  Neck: Normal range of motion and phonation normal. Neck supple.  Cardiovascular: Normal rate, regular rhythm and normal heart sounds.    Pulmonary/Chest: Effort normal and breath sounds normal. She exhibits no bony tenderness.  Abdominal: Soft. There is no tenderness.  Musculoskeletal: Normal range of motion.  Neurological: She is alert. No cranial nerve deficit or sensory deficit. She exhibits normal muscle tone. Coordination normal.  Patient is nonverbal, so cannot assess for dysarthria or aphasia. She follows some commands, to move arms and legs. There is no focal asymmetry of muscle tone or strength. Patient can hold her arms and legs up above her body, but quickly drops them.  Skin: Skin is warm, dry and intact.  Psychiatric:  She appears depressed.  Nursing note and vitals reviewed.   ED Course  Procedures (including critical care time)  Medications  0.9 %  sodium chloride infusion (0 mLs Intravenous Stopped 02/16/15 1806)  LORazepam (ATIVAN) tablet 1 mg (1 mg Oral Given 02/16/15 1957)    No data found.     Labs Review Labs Reviewed  COMPREHENSIVE METABOLIC PANEL - Abnormal; Notable for the following:    Total Protein 9.0 (*)    AST 60 (*)    All other components within normal limits  CBC WITH DIFFERENTIAL/PLATELET - Abnormal; Notable for the following:    Platelets 127 (*)    All other components within normal limits  URINE CULTURE  AMMONIA  ETHANOL  LACTIC ACID, PLASMA  URINALYSIS, ROUTINE W REFLEX MICROSCOPIC (NOT AT [facility])    Imaging Review No results found. I have personally reviewed and evaluated these images and lab results as part of my medical decision-making.   EKG Interpretation   Date/Time:  Sunday February 16 2015 14:33:20 EDT Ventricular Rate:  72 PR Interval:  134 QRS Duration: 76 QT Interval:  410 QTC Calculation: 449 R Axis:   71 Text Interpretation:  Sinus rhythm Borderline T wave abnormalities since  last tracing no significant change Confirmed by Eulis Foster  MD, Modestine Scherzinger ([id number])  on 02/16/2015 3:45:42 PM      MDM   Final diagnoses:  Delirium    Dementia with possible  acute delerium. Pt evaluated for acute toxic and metabolic processes in the ED.   Nursing Notes Reviewed/ Care Coordinated Applicable Imaging Reviewed Interpretation of Laboratory Data incorporated into ED treatment   Care to Dr. Rogene Houston to evaluate after return of testing and evaluation by Case Management.    Daleen Bo, MD 02/26/15 [phone]

## 2015-02-16 NOTE — ED Provider Notes (Signed)
Results for orders placed or performed during the hospital encounter of 02/16/15  Ammonia  Result Value Ref Range   Ammonia 15 9 - 35 umol/L  Comprehensive metabolic panel  Result Value Ref Range   Sodium 139 135 - 145 mmol/L   Potassium 4.2 3.5 - 5.1 mmol/L   Chloride 102 101 - 111 mmol/L   CO2 28 22 - 32 mmol/L   Glucose, Bld 83 65 - 99 mg/dL   BUN 12 6 - 20 mg/dL   Creatinine, Ser 0.74 0.44 - 1.00 mg/dL   Calcium 9.5 8.9 - 10.3 mg/dL   Total Protein 9.0 (H) 6.5 - 8.1 g/dL   Albumin 4.3 3.5 - 5.0 g/dL   AST 60 (H) 15 - 41 U/L   ALT 39 14 - 54 U/L   Alkaline Phosphatase 55 38 - 126 U/L   Total Bilirubin 0.6 0.3 - 1.2 mg/dL   GFR calc non Af Amer >60 >60 mL/min   GFR calc Af Amer >60 >60 mL/min   Anion gap 9 5 - 15  CBC WITH DIFFERENTIAL  Result Value Ref Range   WBC 5.5 4.0 - 10.5 K/uL   RBC 4.56 3.87 - 5.11 MIL/uL   Hemoglobin 13.0 12.0 - 15.0 g/dL   HCT 38.0 36.0 - 46.0 %   MCV 83.3 78.0 - 100.0 fL   MCH 28.5 26.0 - 34.0 pg   MCHC 34.2 30.0 - 36.0 g/dL   RDW 13.5 11.5 - 15.5 %   Platelets 127 (L) 150 - 400 K/uL   Neutrophils Relative % 56 %   Lymphocytes Relative 33 %   Monocytes Relative 8 %   Eosinophils Relative 2 %   Basophils Relative 1 %   Neutro Abs 3.1 1.7 - 7.7 K/uL   Lymphs Abs 1.8 0.7 - 4.0 K/uL   Monocytes Absolute 0.4 0.1 - 1.0 K/uL   Eosinophils Absolute 0.1 0.0 - 0.7 K/uL   Basophils Absolute 0.1 0.0 - 0.1 K/uL   Smear Review MORPHOLOGY UNREMARKABLE   Ethanol  Result Value Ref Range   Alcohol, Ethyl (B) <5 <5 mg/dL  Lactic acid, plasma  Result Value Ref Range   Lactic Acid, Venous 0.7 0.5 - 2.0 mmol/L  Urinalysis, Routine w reflex microscopic (not at [facility])  Result Value Ref Range   Color, Urine YELLOW YELLOW   APPearance CLEAR CLEAR   Specific Gravity, Urine 1.022 1.005 - 1.030   pH 6.0 5.0 - 8.0   Glucose, UA NEGATIVE NEGATIVE mg/dL   Hgb urine dipstick NEGATIVE NEGATIVE   Bilirubin Urine NEGATIVE NEGATIVE   Ketones, ur NEGATIVE NEGATIVE  mg/dL   Protein, ur NEGATIVE NEGATIVE mg/dL   Urobilinogen, UA 0.2 0.0 - 1.0 mg/dL   Nitrite NEGATIVE NEGATIVE   Leukocytes, UA NEGATIVE NEGATIVE   Dg Chest 2 View  02/16/2015   CLINICAL DATA:  Altered mental status.  EXAM: CHEST  2 VIEW  COMPARISON:  February 10, 2015.  FINDINGS: The heart size and mediastinal contours are within normal limits. Both lungs are clear. No pneumothorax or pleural effusion is noted. The visualized skeletal structures are unremarkable.  IMPRESSION: No active cardiopulmonary disease.   Electronically Signed   By: Marijo Conception, M.D.   On: 02/16/2015 15:50   Ct Head Wo Contrast  02/16/2015   CLINICAL DATA:  Altered mental status.  EXAM: CT HEAD WITHOUT CONTRAST  TECHNIQUE: Contiguous axial images were obtained from the base of the skull through the vertex without intravenous contrast.  COMPARISON:  11/08/2014  FINDINGS: Mild cerebral and moderate cerebellar atrophy are unchanged. There is no evidence of acute cortical infarct, intracranial hemorrhage, mass, midline shift, or extra-axial fluid collection.  Visualized orbits are unremarkable. There is partial opacification of a posterior right ethmoid air cell. Mastoid air cells are clear. Mild calcified atherosclerosis is noted at the skullbase.  IMPRESSION: 1. No evidence of acute intracranial abnormality. 2. Moderate cerebellar and mild cerebral atrophy.   Electronically Signed   By: Logan Bores M.D.   On: 02/16/2015 15:53   Dg Chest Port 1 View  02/10/2015   CLINICAL DATA:  Pt's caregiver reports pt has had AMS x 3 days. States she would not answer questions. Has history of of dementia. Also states that Pt has had cough x 3 days "coughing like she is trying to get sick"  EXAM: PORTABLE CHEST 1 VIEW  COMPARISON:  None.  FINDINGS: The heart size and mediastinal contours are within normal limits. Both lungs are clear. The visualized skeletal structures are unremarkable.  IMPRESSION: No active disease.   Electronically Signed    By: Nolon Nations M.D.   On: 02/10/2015 15:50     Patient without any of laboratory or x-ray findings requiring admission. Consult to case management has been completed and they will contact the family tomorrow to arrange help at home. Patient will be given her medications here prior to discharge. Caregiver with patient now understands the plan.   [name] Sorrow, MD 02/16/15 1928

## 2015-02-16 NOTE — ED Notes (Signed)
Pt ambulated to BR with steady gait. Pt denies pain. Awake. Verbally responsive. A/Ox1 (name). Resp even and unlabored. No audible adventitious breath sounds noted. ABC's intact. SR on monitor. IV infusing NS at 135ml/hr without difficulty. Caregiver at bedside.

## 2015-02-17 LAB — URINE CULTURE: Culture: NO GROWTH

## 2015-02-24 ENCOUNTER — Encounter: Payer: Self-pay | Admitting: Family Medicine

## 2015-02-24 ENCOUNTER — Ambulatory Visit: Payer: Medicaid Other | Attending: Family Medicine | Admitting: Family Medicine

## 2015-02-24 VITALS — BP 120/79 | HR 82 | Temp 99.1°F | Resp 16 | Ht 59.0 in | Wt 88.0 lb

## 2015-02-24 DIAGNOSIS — F329 Major depressive disorder, single episode, unspecified: Secondary | ICD-10-CM

## 2015-02-24 DIAGNOSIS — Z79899 Other long term (current) drug therapy: Secondary | ICD-10-CM | POA: Insufficient documentation

## 2015-02-24 DIAGNOSIS — Z87891 Personal history of nicotine dependence: Secondary | ICD-10-CM | POA: Insufficient documentation

## 2015-02-24 DIAGNOSIS — F32A Depression, unspecified: Secondary | ICD-10-CM

## 2015-02-24 DIAGNOSIS — R4182 Altered mental status, unspecified: Secondary | ICD-10-CM | POA: Diagnosis present

## 2015-02-24 NOTE — Progress Notes (Signed)
   Subjective:  Patient ID: [name] [name], female    DOB: 1955-06-07  Age: 59 y.o. MRN: [id number]  CC: Altered Mental Status   HPI [name] [name] presents for   1. Patient went to ED on 11/3 and 11/8 for altered mental status. She comes today with her CNA. She was not taking her medication, staring off in space, going for prolonged walks. W/u was negative. No fever. No sick contacts. She has depression. She admits to depressed mood. She is taking zoloft.   Social History  Substance Use Topics  . Smoking status: Former Research scientist (life sciences)  . Smokeless tobacco: Never Used  . Alcohol Use: No    Outpatient Prescriptions Prior to Visit  Medication Sig Dispense Refill  . elvitegravir-cobicistat-emtricitabine-tenofovir (GENVOYA) 150-150-200-10 MG TABS tablet Take 1 tablet by mouth daily. 30 tablet 11  . levETIRAcetam (KEPPRA) 750 MG tablet Take 1 tablet (750 mg total) by mouth 2 (two) times daily. 60 tablet 12  . LORazepam (ATIVAN) 1 MG tablet Take 1 tablet (1 mg total) by mouth every 8 (eight) hours as needed (agitation). 30 tablet 0  . Multiple Vitamin (MULTIVITAMIN) tablet Take 1 tablet by mouth daily.      . sertraline (ZOLOFT) 50 MG tablet Take 3 tablets (150 mg total) by mouth daily. 90 tablet 12  . feeding supplement, ENSURE ENLIVE, (ENSURE ENLIVE) LIQD Take 237 mLs by mouth 2 (two) times daily between meals. ICD 10 code: E43, R63.6 (Patient not taking: Reported on 02/24/2015) 14220 mL 11   No facility-administered medications prior to visit.    ROS Review of Systems  Constitutional: Negative for fever and chills.  Eyes: Negative for visual disturbance.  Respiratory: Negative for shortness of breath.   Cardiovascular: Negative for chest pain.  Gastrointestinal: Negative for abdominal pain and blood in stool.  Musculoskeletal: Negative for back pain and arthralgias.  Skin: Negative for rash.  Allergic/Immunologic: Negative for immunocompromised state.  Hematological: Negative for  adenopathy. Does not bruise/bleed easily.  Psychiatric/Behavioral: Negative for suicidal ideas and dysphoric mood.    Objective:  BP 120/79 mmHg  Pulse 82  Temp(Src) 99.1 F (37.3 C) (Oral)  Resp 16  Ht 4\' 11"  (1.499 m)  Wt 88 lb (39.917 kg)  BMI 17.76 kg/m2  SpO2 98%  BP/Weight 02/24/2015 02/16/2015 40/01/8118  Systolic BP 147 829 562  Diastolic BP 79 86 79  Wt. (Lbs) 88 85 -  BMI 17.76 16.6 -   Wt Readings from Last 3 Encounters:  02/24/15 88 lb (39.917 kg)  02/16/15 85 lb (38.556 kg)  01/14/15 85 lb (38.556 kg)    Physical Exam  Constitutional: She is oriented to person, place, and time. She appears well-developed and well-nourished. No distress.  HENT:  Head: Normocephalic and atraumatic.  Cardiovascular: Normal rate, regular rhythm, normal heart sounds and intact distal pulses.   Pulmonary/Chest: Effort normal and breath sounds normal.  Musculoskeletal: She exhibits no edema.  Neurological: She is alert and oriented to person, place, and time.  Skin: Skin is warm and dry. No rash noted.  Psychiatric: She has a normal mood and affect.    Assessment & Plan:   Problem List Items Addressed This Visit    Depressed - Primary (Chronic)   Relevant Orders   Ambulatory referral to Psychiatry      No orders of the defined types were placed in this encounter.    Follow-up: No Follow-up on file.   Boykin Nearing MD

## 2015-02-24 NOTE — Progress Notes (Signed)
HTN (multiple time) due to Sz, dhydratrion  and alterate mental status   No pain today  No Hx tobacco

## 2015-02-24 NOTE — Patient Instructions (Addendum)
[name] [name] was seen today for altered mental status.  Diagnoses and all orders for this visit:  Depressed -     Ambulatory referral to Psychiatry   F/u in 3 months  Dr. Adrian Blackwater

## 2015-02-27 ENCOUNTER — Ambulatory Visit: Payer: Medicaid Other | Admitting: *Deleted

## 2015-02-27 DIAGNOSIS — F05 Delirium due to known physiological condition: Secondary | ICD-10-CM

## 2015-02-27 NOTE — BH Specialist Note (Signed)
Shayona was present for her scheduled appointment today with counselor.  Client was alert but moving slow.  Client was slow to walk and slow to answer questions posed by counselor.  Client did respond appropriately once she responded.  Client care taker was present and shared that client had a very confused state last week and could not answer simple questions or follow directions.  Today client was smiling and indicating that she was feeling ok with the exception of her right eye hurting.  Client reported that she feels like there is something in her eye.  Client denied any substance use outside of what was being prescribed to her.  Client stated that she needed some ensure and was waiting for [facility] to call her and let her know when it was in. Client stated that her anxiety was better and that the medication she was on was helping.  Client's care taker indicated that she was prescribed the anxiety medication as PRN but that she was giving it to her every day to help with her mood and anxiousness.  Client did not have much to say but was pleasant and cooperative. It is suspected that client suffered from dehydration possibly leading top the confused state.  Client stated that she was feeling pretty good and had no worries right now.  Counselor encouraged client as well as her attendant to call if she needed further services or unexplainable behaviors started happening again.  Both agreed.  Rolena Infante, MA, [facility]

## 2015-03-03 ENCOUNTER — Telehealth: Payer: Self-pay | Admitting: Family Medicine

## 2015-03-03 NOTE — Telephone Encounter (Signed)
Pt. Called requesting a med refill on LORazepam (ATIVAN) 1 MG tablet. Please f/u with pt.

## 2015-03-03 NOTE — Telephone Encounter (Signed)
Ativan was refill on 02/10/2015 Spoke with pt mother  Pt was given #30 on 02/10/2015

## 2015-03-04 ENCOUNTER — Telehealth: Payer: Self-pay | Admitting: Family Medicine

## 2015-03-04 NOTE — Telephone Encounter (Signed)
Pt. Mother called requesting a med refill on LORazepam (ATIVAN) 1 MG tablet. Pt. Has no more medication because she was taking 2 a day. Please f/u with pt.

## 2015-03-07 ENCOUNTER — Ambulatory Visit (INDEPENDENT_AMBULATORY_CARE_PROVIDER_SITE_OTHER): Payer: Medicaid Other | Admitting: *Deleted

## 2015-03-07 ENCOUNTER — Other Ambulatory Visit (HOSPITAL_COMMUNITY)
Admission: RE | Admit: 2015-03-07 | Discharge: 2015-03-07 | Disposition: A | Discharge status: Home / Self Care | Payer: Medicaid Other | Source: Ambulatory Visit | Attending: Internal Medicine | Admitting: Internal Medicine

## 2015-03-07 DIAGNOSIS — Z124 Encounter for screening for malignant neoplasm of cervix: Secondary | ICD-10-CM | POA: Diagnosis not present

## 2015-03-07 DIAGNOSIS — Z01419 Encounter for gynecological examination (general) (routine) without abnormal findings: Secondary | ICD-10-CM | POA: Diagnosis not present

## 2015-03-07 NOTE — Progress Notes (Signed)
  Subjective:     [name] [name] is a 59 y.o. woman who comes in today for a  pap smear only. Previous abnormal Pap smears: no. Contraception:  Condoms.  Objective:    There were no vitals taken for this visit. Pelvic Exam:  Pap smear obtained.   Assessment:    Screening pap smear.   Plan:    Follow up in one year, or as indicated by Pap results.  Pt given educational materials re: HIV and women, self-esteem, BSE, nutrition and diet management, PAP smears and partner safety. Pt given condoms

## 2015-03-07 NOTE — Patient Instructions (Signed)
Your results will be ready in about a week.  I will mail them to you.  Thank you for coming to the Center for your care.  Denise,  RN 

## 2015-03-11 ENCOUNTER — Other Ambulatory Visit: Payer: Self-pay | Admitting: Family Medicine

## 2015-03-11 DIAGNOSIS — F0391 Unspecified dementia with behavioral disturbance: Secondary | ICD-10-CM

## 2015-03-11 DIAGNOSIS — R569 Unspecified convulsions: Secondary | ICD-10-CM

## 2015-03-11 LAB — CYTOLOGY - PAP

## 2015-03-11 NOTE — Telephone Encounter (Signed)
Patient's CNA called requesting a medication refill for LORazepam (ATIVAN) Please follow up with patient. Patients CNA would also like a phone call from the nurse.

## 2015-03-13 MED ORDER — LORAZEPAM 1 MG PO TABS: 1.0000 mg | ORAL_TABLET | Freq: Three times a day (TID) | ORAL | Status: DC | PRN

## 2015-03-13 NOTE — Telephone Encounter (Signed)
Ambrose Finland informed that ativan ready for pick up Also informed her that sooner f/u appt with Dr. Tish Frederickson recommended. She has # and will arrange appt.

## 2015-03-13 NOTE — Telephone Encounter (Signed)
LVM to return call   Rx at front office  Advised Pt CNA, arrange f/u appointment with neurologist [phone]

## 2015-03-13 NOTE — Telephone Encounter (Signed)
Ativan refilled Please have CNA arrange f/u appt with patient's neurologist (Dr. Leta Baptist [phone]) given changes in mental status, ? Seizure activity when staring off.

## 2015-03-13 NOTE — Telephone Encounter (Signed)
Patient's CNA called requesting a medication refill for LORazepam (ATIVAN) Please follow up with patient. Patients CNA would also like a phone call from the nurse.

## 2015-03-13 NOTE — Addendum Note (Signed)
Addended by: Boykin Nearing on: 03/13/2015 03:33 PM   Modules accepted: Orders

## 2015-03-14 ENCOUNTER — Encounter: Payer: Self-pay | Admitting: *Deleted

## 2015-03-19 ENCOUNTER — Telehealth: Payer: Self-pay | Admitting: Family Medicine

## 2015-03-19 NOTE — Telephone Encounter (Signed)
[name] [name] from [facility] called and requested to speak with PCP. She requested and increase for PCA services and for the PCP to fill out an application requesting for PCA hours to be increased due to safety and personal care concerns. Patient currently gets 52 hours during the month and max she is able to get is 80-84 hours through Medicaid.  Please f/u with care manager for further information.

## 2015-03-26 ENCOUNTER — Ambulatory Visit (INDEPENDENT_AMBULATORY_CARE_PROVIDER_SITE_OTHER): Payer: Medicaid Other | Admitting: Diagnostic Neuroimaging

## 2015-03-26 ENCOUNTER — Encounter: Payer: Self-pay | Admitting: Diagnostic Neuroimaging

## 2015-03-26 VITALS — BP 100/68 | HR 79 | Ht 59.0 in | Wt 97.2 lb

## 2015-03-26 DIAGNOSIS — F0280 Dementia in other diseases classified elsewhere without behavioral disturbance: Secondary | ICD-10-CM

## 2015-03-26 DIAGNOSIS — B20 Human immunodeficiency virus [HIV] disease: Secondary | ICD-10-CM | POA: Diagnosis not present

## 2015-03-26 DIAGNOSIS — G40109 Localization-related (focal) (partial) symptomatic epilepsy and epileptic syndromes with simple partial seizures, not intractable, without status epilepticus: Secondary | ICD-10-CM | POA: Diagnosis not present

## 2015-03-26 MED ORDER — LEVETIRACETAM 1000 MG PO TABS: 1000.0000 mg | ORAL_TABLET | Freq: Two times a day (BID) | ORAL | Status: DC

## 2015-03-26 NOTE — Progress Notes (Addendum)
[facility]  PATIENT: [name] [name] DOB: Dec 08, 1955  REFERRING CLINICIAN:  HISTORY FROM: patient and home health aid [name]) REASON FOR VISIT: follow up visit   HISTORICAL  CHIEF COMPLAINT:  Chief Complaint  Patient presents with  . Seizures    rm 7 , internal referral, CNA- Angela  . Dementia    MMSE 16    HISTORY OF PRESENT ILLNESS:   UPDATE 03/26/15: Patient has continued with memory loss, confusion, staring spells. Multiple ER visits. CNA comes to help pt and mother 8 hrs per day, 7 days per week. Staring spells worse with stress and tension.   UPDATE 09/05/14: Per caregiver, only 1 seizure (staring spell) in Nov 2015, but pt didn't go to hospital. Had 1 event of hallucinations in setting of UTI. Pt still living at home, with 50yo mother upstairs. [facility] aids come to help.   UPDATE 08/30/13: Since last visit, she may have had 2 seizures last month, but patient doesn't remember. This is reported second hand. Patient's 71 year old mother with dementia, thinks she heard a sound last month from the upstairs apartment and thinks patient may have had a seizure. However this was not corroborated by first hand account or by any other witnesses. Patient's home health aide has never witnessed any seizures.  UPDATE 06/13/12 (CM): patient returns for folowup with her caregiver. Last seizure over a year ago. Patient claims she stopped using crack cocaine in December 2013. Patient does not drive. She needs refills on her Keppra. She claims she has not missed doses of her meds.   PRIOR HPI (Dr. Mendel Corning): [name], 59 yr old returns for follow-up. She was last seen 06/14/11. She was evaluated by Dr. Doy Mince 10/01/09 for "Seizures" which caregiver has seen increase in frequency over 2 years- occur at least 2/wk- starts w/ becoming less responsive, R hand clenches, grits teeth, will fall if standing- altered for about 23min, then normal- unaware of event- no  assoc w/ activity or time of day- no shaking all over- events stereotypical- has been on Dilantin in past, placed on Keppra by Dr. Doy Mince. No further seizure activity.  Care giver who stays 3 hrs M-Friday and gives her meds and makes sure she has food, etc.   H/o CHI in past- no known FH sz- active cocaine use-   REVIEW OF SYSTEMS: Full 14 system review of systems performed and notable only for seizure hallucinations depression anxiety memory loss weight loss.   ALLERGIES: No Known Allergies  HOME MEDICATIONS: Outpatient Prescriptions Prior to Visit  Medication Sig Dispense Refill  . elvitegravir-cobicistat-emtricitabine-tenofovir (GENVOYA) 150-150-200-10 MG TABS tablet Take 1 tablet by mouth daily. 30 tablet 11  . feeding supplement, ENSURE ENLIVE, (ENSURE ENLIVE) LIQD Take 237 mLs by mouth 2 (two) times daily between meals. ICD 10 code: E43, R63.6 14220 mL 11  . levETIRAcetam (KEPPRA) 750 MG tablet Take 1 tablet (750 mg total) by mouth 2 (two) times daily. 60 tablet 12  . LORazepam (ATIVAN) 1 MG tablet Take 1 tablet (1 mg total) by mouth every 8 (eight) hours as needed (agitation). 30 tablet 0  . Multiple Vitamin (MULTIVITAMIN) tablet Take 1 tablet by mouth daily.      . sertraline (ZOLOFT) 50 MG tablet Take 3 tablets (150 mg total) by mouth daily. 90 tablet 12   No facility-administered medications prior to visit.    PAST MEDICAL HISTORY: Past Medical History  Diagnosis Date  . HIV infection ([place])   . Substance abuse   .  H/O head injury   . Seizures ([place])   . Dementia   . Depression     PAST SURGICAL HISTORY: Past Surgical History  Procedure Laterality Date  . Unremarkable      FAMILY HISTORY: Family History  Problem Relation Age of Onset  . Colon cancer Neg Hx   . Diabetes Mother   . Behavior problems Mother   . Headache Mother     SOCIAL HISTORY:  Social History   Social History  . Marital Status: Single    Spouse Name: N/A  . Number of Children: 1  .  Years of Education: N/A   Occupational History  .     Social History Main Topics  . Smoking status: Former Research scientist (life sciences)  . Smokeless tobacco: Never Used  . Alcohol Use: No  . Drug Use: 7.00 per week     Comment: crack patient states she does (sometimes), 03/26/15 denies  . Sexual Activity:    Partners: Male     Comment: pt. given condoms   Other Topics Concern  . Not on file   Social History Narrative   Patient lives at home with her mother.   Caffeine Use: 0     PHYSICAL EXAM  Filed Vitals:   03/26/15 0916  BP: 100/68  Pulse: 79  Height: 4\' 11"  (1.499 m)  Weight: 97 lb 3.2 oz (44.09 kg)    Not recorded      Body mass index is 19.62 kg/(m^2).  MMSE - Mini Mental State Exam 03/26/2015  Orientation to time 0  Orientation to Place 3  Registration 3  Attention/ Calculation 5  Recall 0  Language- name 2 objects 2  Language- repeat 0  Language- follow 3 step command 2  Language- follow 3 step command-comments folded paper twice  Language- read & follow direction 1  Write a sentence 0  Copy design 0  Total score 16    GENERAL EXAM: Patient is in no distress; well developed, nourished and groomed; neck is supple; FRAIL APPEARING. APPEARS OLDER THAN STATED AGE.  CARDIOVASCULAR: Regular rate and rhythm, no murmurs, no carotid bruits  NEUROLOGIC: MENTAL STATUS: awake; MINIMAL SPEECH; FOLLOWS SIMPLE COMMANDS CRANIAL NERVE: pupils equal and reactive to light, visual fields full to confrontation, extraocular muscles intact, ENDGAZE NYSTAGMUS, facial sensation and strength symmetric, hearing intact, palate elevates symmetrically, uvula midline, shoulder shrug symmetric, tongue midline. MOTOR: normal bulk and tone, full strength in the BUE, BLE SENSORY: normal and symmetric to light touch COORDINATION: finger-nose-finger normal REFLEXES: deep tendon reflexes present and symmetric GAIT/STATION: narrow based gait     DIAGNOSTIC DATA (LABS, IMAGING, TESTING) - I  reviewed patient records, labs, notes, testing and imaging myself where available.  Lab Results  Component Value Date   WBC 5.5 02/16/2015   HGB 13.0 02/16/2015   HCT 38.0 02/16/2015   MCV 83.3 02/16/2015   PLT 127* 02/16/2015      Component Value Date/Time   NA 139 02/16/2015 1516   K 4.2 02/16/2015 1516   CL 102 02/16/2015 1516   CO2 28 02/16/2015 1516   GLUCOSE 83 02/16/2015 1516   BUN 12 02/16/2015 1516   CREATININE 0.74 02/16/2015 1516   CREATININE 0.86 10/14/2014 1619   CALCIUM 9.5 02/16/2015 1516   PROT 9.0* 02/16/2015 1516   ALBUMIN 4.3 02/16/2015 1516   AST 60* 02/16/2015 1516   ALT 39 02/16/2015 1516   ALKPHOS 55 02/16/2015 1516   BILITOT 0.6 02/16/2015 1516   GFRNONAA >60 02/16/2015 1516  GFRNONAA 76 10/14/2014 1619   GFRAA >60 02/16/2015 1516   GFRAA 87 10/14/2014 1619   Lab Results  Component Value Date   CHOL 208* 10/14/2014   HDL 46 10/14/2014   LDLCALC 112* 10/14/2014   TRIG 252* 10/14/2014   CHOLHDL 4.5 10/14/2014   Lab Results  Component Value Date   HGBA1C 5.8* 11/09/2014   No results found for: VITAMINB12 Lab Results  Component Value Date   TSH 1.726 11/09/2014    10/08/09 EEG - intermittent slowing of the left fronto-temporal area; no epileptiform discharges  10/16/09 MRI brain - mild generalized atrophy which is age inappropriate. No structural lesions are noted.  02/16/15 CT head  1. No evidence of acute intracranial abnormality. 2. Moderate cerebellar and mild cerebral atrophy.   ASSESSMENT AND PLAN  60 y.o. year old female here with dementia (? HIV assoc) + complex partial seizures.   Dx: complex partial seizures + dementia (? HIV associated; MMSE 16) + history of crack cocaine abuse + history of traumatic brain injury + depression/anxiety  PLAN: - increase LEV to 1000mg  BID - needs 24 hours supervision - consider palliative care consult  Meds ordered this encounter  Medications  . levETIRAcetam (KEPPRA) 1000 MG tablet     Sig: Take 1 tablet (1,000 mg total) by mouth 2 (two) times daily.    Dispense:  60 tablet    Refill:  12   Return in about 6 months (around 09/23/2015).    Penni Bombard, MD [id number], [id number] AM Certified in Neurology, Neurophysiology and Neuroimaging  [facility] [address], [place], [place] [address] [phone]

## 2015-03-26 NOTE — Patient Instructions (Signed)
Thank you for coming to see [place] at [facility]. I hope we have been able to provide you high quality care today.  You may receive a patient satisfaction survey over the next few weeks. We would appreciate your feedback and comments so that we may continue to improve ourselves and the health of our patients.  - change levetiracetam to 1000mg  twice a day

## 2015-03-27 NOTE — Telephone Encounter (Signed)
New call, [name] [name] from [facility] called and requested to speak with PCP. She requested and increase for PCA services and for the PCP to fill out an application requesting for PCA hours to be increased due to safety and personal care concerns. Patient currently gets 52 hours during the month and max she is able to get is 80-84 hours through Medicaid.  Please f/u with care manager for further information.

## 2015-03-28 NOTE — Telephone Encounter (Signed)
That sounds good to me. Please all back to Earlie Server to have application faxed

## 2015-04-01 ENCOUNTER — Ambulatory Visit: Payer: Self-pay | Admitting: *Deleted

## 2015-04-14 ENCOUNTER — Telehealth: Payer: Self-pay | Admitting: Family Medicine

## 2015-04-14 NOTE — Telephone Encounter (Signed)
Patients CNA Came in requesting a medication refill for Lorazepam CNA would also like to speak with nurse, she has general questions Please follow up.

## 2015-04-17 ENCOUNTER — Ambulatory Visit: Payer: Self-pay | Admitting: Internal Medicine

## 2015-04-17 MED ORDER — LORAZEPAM 1 MG PO TABS: 1.0000 mg | ORAL_TABLET | Freq: Three times a day (TID) | ORAL | Status: DC | PRN

## 2015-04-17 NOTE — Telephone Encounter (Signed)
Pt CNA aware  Will pick up Rx tomorrow

## 2015-04-17 NOTE — Telephone Encounter (Signed)
Ativan refilled and placed up front for pick up

## 2015-04-21 ENCOUNTER — Ambulatory Visit: Payer: Self-pay | Admitting: *Deleted

## 2015-05-19 ENCOUNTER — Ambulatory Visit: Payer: Self-pay | Admitting: *Deleted

## 2015-05-29 ENCOUNTER — Other Ambulatory Visit: Payer: Self-pay | Admitting: Family Medicine

## 2015-05-29 DIAGNOSIS — Z1231 Encounter for screening mammogram for malignant neoplasm of breast: Secondary | ICD-10-CM

## 2015-06-05 ENCOUNTER — Telehealth: Payer: Self-pay | Admitting: Family Medicine

## 2015-06-05 NOTE — Telephone Encounter (Signed)
Patient needs medication refill for her LORazepam (ATIVAN) 1 MG tablet [id number]....please follow up with patient

## 2015-06-09 ENCOUNTER — Ambulatory Visit: Payer: Self-pay

## 2015-06-12 MED ORDER — LORAZEPAM 1 MG PO TABS: 1.0000 mg | ORAL_TABLET | Freq: Three times a day (TID) | ORAL | Status: DC | PRN

## 2015-06-12 NOTE — Telephone Encounter (Signed)
Ativan ready for pick up

## 2015-06-12 NOTE — Telephone Encounter (Signed)
Pt Aid notified Rx at front office ready to be pick up Bring picture ID to pick up

## 2015-06-26 ENCOUNTER — Other Ambulatory Visit: Payer: Self-pay | Admitting: *Deleted

## 2015-06-26 ENCOUNTER — Ambulatory Visit (INDEPENDENT_AMBULATORY_CARE_PROVIDER_SITE_OTHER): Payer: Medicaid Other | Admitting: Internal Medicine

## 2015-06-26 ENCOUNTER — Encounter: Payer: Self-pay | Admitting: Internal Medicine

## 2015-06-26 VITALS — BP 136/84 | HR 77 | Temp 98.4°F | Wt 95.2 lb

## 2015-06-26 DIAGNOSIS — R636 Underweight: Secondary | ICD-10-CM

## 2015-06-26 DIAGNOSIS — B20 Human immunodeficiency virus [HIV] disease: Secondary | ICD-10-CM

## 2015-06-26 DIAGNOSIS — E43 Unspecified severe protein-calorie malnutrition: Secondary | ICD-10-CM

## 2015-06-26 MED ORDER — ENSURE ENLIVE PO LIQD: 237.0000 mL | Freq: Two times a day (BID) | ORAL | Status: DC

## 2015-06-26 NOTE — Progress Notes (Signed)
  Subjective:    Patient ID: [name] [name], female    DOB: [birth year], 60 y.o.   MRN: [id number]  HPI She comes in for followup of her HIV.  She has been on Atripla and denies any missed doses. She is given her medications by her mother and her home aide is with her today.  She has no new complaints.  Did see neurology for continued memory issues.  Had been using crack cocaine.  No diarrhea, no rash.       Review of Systems  Constitutional: Negative for fatigue and unexpected weight change.  HENT: Negative for ear discharge, sore throat and trouble swallowing.   Gastrointestinal: Negative for diarrhea.  Skin: Negative for rash.  Neurological: Negative for dizziness and headaches.  Psychiatric/Behavioral: Negative for dysphoric mood.       Objective:   Physical Exam  Constitutional: She appears well-developed and well-nourished. No distress.  HENT:  Mouth/Throat: No oropharyngeal exudate.  Eyes: Conjunctivae are normal. Right eye exhibits no discharge. Left eye exhibits no discharge. No scleral icterus.  Cardiovascular: Normal rate, regular rhythm and normal heart sounds.   No murmur heard. Pulmonary/Chest: Effort normal and breath sounds normal. No respiratory distress.  Lymphadenopathy:    She has no cervical adenopathy.    She has no axillary adenopathy.       Right: No supraclavicular adenopathy present.       Left: No supraclavicular adenopathy present.  Skin: No rash noted.          Assessment & Plan:

## 2015-06-26 NOTE — Assessment & Plan Note (Signed)
Doing well on current regimen.  Labs today and rtc 6 months unless concerns.  Gets lipids from PCP

## 2015-06-26 NOTE — Assessment & Plan Note (Signed)
Continue to encourage eating

## 2015-06-27 LAB — T-HELPER CELL (CD4) - (RCID CLINIC ONLY)
CD4 T CELL ABS: 460 /uL (ref 400–2700)
CD4 T CELL HELPER: 28 % — AB (ref 33–55)

## 2015-06-27 LAB — HIV-1 RNA QUANT-NO REFLEX-BLD

## 2015-07-03 ENCOUNTER — Telehealth: Payer: Self-pay | Admitting: *Deleted

## 2015-07-03 ENCOUNTER — Other Ambulatory Visit: Payer: Self-pay | Admitting: Family Medicine

## 2015-07-03 DIAGNOSIS — F0391 Unspecified dementia with behavioral disturbance: Secondary | ICD-10-CM

## 2015-07-03 MED ORDER — LORAZEPAM 1 MG PO TABS
1.0000 mg | ORAL_TABLET | Freq: Three times a day (TID) | ORAL | Status: DC | PRN
Start: 2015-07-03 — End: 2015-11-04

## 2015-07-03 NOTE — Telephone Encounter (Signed)
Refill request for patient's ativan received from her pharamacy Ativan refilled.

## 2015-07-03 NOTE — Telephone Encounter (Signed)
Pt notified Rx at front office ready to be pickup 

## 2015-07-07 ENCOUNTER — Ambulatory Visit
Admission: RE | Admit: 2015-07-07 | Discharge: 2015-07-07 | Disposition: A | Discharge status: Home / Self Care | Payer: Medicaid Other | Source: Ambulatory Visit | Attending: Family Medicine | Admitting: Family Medicine

## 2015-07-07 DIAGNOSIS — Z1231 Encounter for screening mammogram for malignant neoplasm of breast: Secondary | ICD-10-CM

## 2015-09-08 ENCOUNTER — Ambulatory Visit: Payer: Medicaid Other | Admitting: Diagnostic Neuroimaging

## 2015-09-09 ENCOUNTER — Encounter: Payer: Self-pay | Admitting: Diagnostic Neuroimaging

## 2015-09-22 ENCOUNTER — Other Ambulatory Visit: Payer: Self-pay | Admitting: *Deleted

## 2015-10-13 ENCOUNTER — Encounter: Payer: Self-pay | Admitting: Diagnostic Neuroimaging

## 2015-10-13 ENCOUNTER — Ambulatory Visit (INDEPENDENT_AMBULATORY_CARE_PROVIDER_SITE_OTHER): Payer: Medicaid Other | Admitting: Diagnostic Neuroimaging

## 2015-10-13 VITALS — BP 108/71 | HR 81 | Ht 59.0 in | Wt 103.4 lb

## 2015-10-13 DIAGNOSIS — G40109 Localization-related (focal) (partial) symptomatic epilepsy and epileptic syndromes with simple partial seizures, not intractable, without status epilepticus: Secondary | ICD-10-CM

## 2015-10-13 DIAGNOSIS — F0280 Dementia in other diseases classified elsewhere without behavioral disturbance: Secondary | ICD-10-CM | POA: Diagnosis not present

## 2015-10-13 DIAGNOSIS — B20 Human immunodeficiency virus [HIV] disease: Secondary | ICD-10-CM

## 2015-10-13 MED ORDER — LEVETIRACETAM 1000 MG PO TABS: 1000.0000 mg | ORAL_TABLET | Freq: Two times a day (BID) | ORAL | Status: DC

## 2015-10-13 NOTE — Progress Notes (Signed)
[facility]  PATIENT: [name] [name] DOB: July 04, 1955  REFERRING CLINICIAN:  HISTORY FROM: patient and home health aid [name]) REASON FOR VISIT: follow up visit   HISTORICAL  CHIEF COMPLAINT:  Chief Complaint  Patient presents with  . Localization-related epilepsy    rm 7, needs refills, caregiver- [name], "no known seizure activity per caregiver"  . HIV dementia    MMSE 20   . Follow-up    last seen 03/2015    HISTORY OF PRESENT ILLNESS:   UPDATE 10/13/15: Since last visit, doing better. No definite seizures. Memory loss and confusion continue. Also anxiety is improved.   UPDATE 03/26/15: Patient has continued with memory loss, confusion, staring spells. Multiple ER visits. CNA comes to help pt and mother 8 hrs per day, 7 days per week. Staring spells worse with stress and tension.   UPDATE 09/05/14: Per caregiver, only 1 seizure (staring spell) in Nov 2015, but pt didn't go to hospital. Had 1 event of hallucinations in setting of UTI. Pt still living at home, with 24yo mother upstairs. [facility] aids come to help.   UPDATE 08/30/13: Since last visit, she may have had 2 seizures last month, but patient doesn't remember. This is reported second hand. Patient's 23 year old mother with dementia, thinks she heard a sound last month from the upstairs apartment and thinks patient may have had a seizure. However this was not corroborated by first hand account or by any other witnesses. Patient's home health aide has never witnessed any seizures.  UPDATE 06/13/12 (CM): patient returns for folowup with her caregiver. Last seizure over a year ago. Patient claims she stopped using crack cocaine in December 2013. Patient does not drive. She needs refills on her Keppra. She claims she has not missed doses of her meds.   PRIOR HPI (Dr. Mendel Corning): [name], 60 yr old returns for follow-up. She was last seen 06/14/11. She was evaluated by Dr. Doy Mince 10/01/09 for  "Seizures" which caregiver has seen increase in frequency over 2 years- occur at least 2/wk- starts w/ becoming less responsive, R hand clenches, grits teeth, will fall if standing- altered for about 93min, then normal- unaware of event- no assoc w/ activity or time of day- no shaking all over- events stereotypical- has been on Dilantin in past, placed on Keppra by Dr. Doy Mince. No further seizure activity.  Care giver who stays 3 hrs M-Friday and gives her meds and makes sure she has food, etc.   H/o CHI in past- no known FH sz- active cocaine use-   REVIEW OF SYSTEMS: Full 14 system review of systems performed and negative except: memory loss dizziness seizure speech difficulty weakness blurred vision eye itching chest tightness confusion depression.   ALLERGIES: No Known Allergies  HOME MEDICATIONS: Outpatient Prescriptions Prior to Visit  Medication Sig Dispense Refill  . elvitegravir-cobicistat-emtricitabine-tenofovir (GENVOYA) 150-150-200-10 MG TABS tablet Take 1 tablet by mouth daily. 30 tablet 11  . feeding supplement, ENSURE ENLIVE, (ENSURE ENLIVE) LIQD Take 237 mLs by mouth 2 (two) times daily between meals. ICD 10 code: E43, R63.6 14220 mL 5  . latanoprost (XALATAN) 0.005 % ophthalmic solution 1 drop in both eyes daily  3  . LORazepam (ATIVAN) 1 MG tablet Take 1 tablet (1 mg total) by mouth every 8 (eight) hours as needed (agitation). 60 tablet 1  . Multiple Vitamin (MULTIVITAMIN) tablet Take 1 tablet by mouth daily.      . sertraline (ZOLOFT) 50 MG tablet Take 3 tablets (150  mg total) by mouth daily. 90 tablet 12  . levETIRAcetam (KEPPRA) 1000 MG tablet Take 1 tablet (1,000 mg total) by mouth 2 (two) times daily. 60 tablet 12   No facility-administered medications prior to visit.    PAST MEDICAL HISTORY: Past Medical History  Diagnosis Date  . HIV infection ([place])   . Substance abuse   . H/O head injury   . Seizures ([place])   . Dementia   . Depression     PAST SURGICAL  HISTORY: Past Surgical History  Procedure Laterality Date  . Unremarkable      FAMILY HISTORY: Family History  Problem Relation Age of Onset  . Colon cancer Neg Hx   . Diabetes Mother   . Behavior problems Mother   . Headache Mother     SOCIAL HISTORY:  Social History   Social History  . Marital Status: Single    Spouse Name: N/A  . Number of Children: 1  . Years of Education: 12   Occupational History  .     Social History Main Topics  . Smoking status: Former Research scientist (life sciences)  . Smokeless tobacco: Never Used  . Alcohol Use: No  . Drug Use: 7.00 per week     Comment: crack patient states she does (sometimes), 03/26/15 denies  . Sexual Activity:    Partners: Male     Comment: pt. given condoms   Other Topics Concern  . Not on file   Social History Narrative   Patient lives at home with her mother.   Caffeine Use: 0     PHYSICAL EXAM  Filed Vitals:   10/13/15 1106  BP: 108/71  Pulse: 81  Height: 4\' 11"  (1.499 m)  Weight: 103 lb 6.4 oz (46.902 kg)    Not recorded      Body mass index is 20.87 kg/(m^2).  MMSE - Mini Mental State Exam 10/13/2015 03/26/2015  Orientation to time 4 0  Orientation to Place 5 3  Registration 2 3  Attention/ Calculation 2 5  Recall 0 0  Language- name 2 objects 2 2  Language- repeat 0 0  Language- follow 3 step command 3 2  Language- follow 3 step command-comments - folded paper twice  Language- read & follow direction 1 1  Write a sentence 1 0  Copy design 0 0  Total score 20 16    GENERAL EXAM: Patient is in no distress; well developed, nourished and groomed; neck is supple; FRAIL APPEARING. APPEARS OLDER THAN STATED AGE.  CARDIOVASCULAR: Regular rate and rhythm, no murmurs, no carotid bruits  NEUROLOGIC: MENTAL STATUS: awake; MINIMAL SPEECH; FOLLOWS SIMPLE COMMANDS CRANIAL NERVE: pupils equal and reactive to light, visual fields full to confrontation, extraocular muscles intact, ENDGAZE NYSTAGMUS, facial sensation  and strength symmetric, hearing intact, palate elevates symmetrically, uvula midline, shoulder shrug symmetric, tongue midline. MOTOR: normal bulk and tone, full strength in the BUE, BLE SENSORY: normal and symmetric to light touch COORDINATION: finger-nose-finger normal REFLEXES: deep tendon reflexes present and symmetric GAIT/STATION: narrow based gait     DIAGNOSTIC DATA (LABS, IMAGING, TESTING) - I reviewed patient records, labs, notes, testing and imaging myself where available.  Lab Results  Component Value Date   WBC 5.5 02/16/2015   HGB 13.0 02/16/2015   HCT 38.0 02/16/2015   MCV 83.3 02/16/2015   PLT 127* 02/16/2015      Component Value Date/Time   NA 139 02/16/2015 1516   K 4.2 02/16/2015 1516   CL 102 02/16/2015 1516  CO2 28 02/16/2015 1516   GLUCOSE 83 02/16/2015 1516   BUN 12 02/16/2015 1516   CREATININE 0.74 02/16/2015 1516   CREATININE 0.86 10/14/2014 1619   CALCIUM 9.5 02/16/2015 1516   PROT 9.0* 02/16/2015 1516   ALBUMIN 4.3 02/16/2015 1516   AST 60* 02/16/2015 1516   ALT 39 02/16/2015 1516   ALKPHOS 55 02/16/2015 1516   BILITOT 0.6 02/16/2015 1516   GFRNONAA >60 02/16/2015 1516   GFRNONAA 76 10/14/2014 1619   GFRAA >60 02/16/2015 1516   GFRAA 87 10/14/2014 1619   Lab Results  Component Value Date   CHOL 208* 10/14/2014   HDL 46 10/14/2014   LDLCALC 112* 10/14/2014   TRIG 252* 10/14/2014   CHOLHDL 4.5 10/14/2014   Lab Results  Component Value Date   HGBA1C 5.8* 11/09/2014   No results found for: VITAMINB12 Lab Results  Component Value Date   TSH 1.726 11/09/2014    10/08/09 EEG - intermittent slowing of the left fronto-temporal area; no epileptiform discharges  10/16/09 MRI brain - mild generalized atrophy which is age inappropriate. No structural lesions are noted.  02/16/15 CT head  1. No evidence of acute intracranial abnormality. 2. Moderate cerebellar and mild cerebral atrophy.   ASSESSMENT AND PLAN  60 y.o. year old female  here with dementia (? HIV assoc) + complex partial seizures.   Dx: complex partial seizures + dementia (? HIV associated) + history of crack cocaine abuse + history of traumatic brain injury + depression/anxiety  Localization-related epilepsy ([place])  HIV dementia ([place])     PLAN: I spent 15 minutes of face to face time with patient. Greater than 50% of time was spent in counseling and coordination of care with patient. In summary we discussed:  - continue LEV 1000mg  BID - consider palliative care consult  Meds ordered this encounter  Medications  . levETIRAcetam (KEPPRA) 1000 MG tablet    Sig: Take 1 tablet (1,000 mg total) by mouth 2 (two) times daily.    Dispense:  180 tablet    Refill:  4   Return in about 1 year (around 10/12/2016).    Penni Bombard, MD [id number], [id number] AM Certified in Neurology, Neurophysiology and Neuroimaging  [facility] [address], [place], [place] [address] [phone]

## 2015-11-04 ENCOUNTER — Telehealth: Payer: Self-pay | Admitting: Family Medicine

## 2015-11-04 DIAGNOSIS — F0391 Unspecified dementia with behavioral disturbance: Secondary | ICD-10-CM

## 2015-11-04 MED ORDER — LORAZEPAM 1 MG PO TABS: 1.0000 mg | ORAL_TABLET | Freq: Three times a day (TID) | ORAL | Status: DC | PRN

## 2015-11-04 NOTE — Telephone Encounter (Signed)
Ativan ready for pick up

## 2015-11-04 NOTE — Telephone Encounter (Signed)
Called in ativan to [facility]

## 2015-11-05 ENCOUNTER — Other Ambulatory Visit: Payer: Self-pay | Admitting: Pharmacist

## 2015-11-05 ENCOUNTER — Telehealth: Payer: Self-pay | Admitting: Family Medicine

## 2015-11-05 NOTE — Telephone Encounter (Signed)
Called pt. Pt mother answered. Asked to speak with pt. Pt mother stated pt was unable to come to the phone at the moment. Asked mother if I could leave a message with a pt to return our call. Pt mother asked "what type of message do you have to give her." Informed pt mother that I am unable to disclose the information which is against HIPAA. Pt mother stated "well if you cannot give me any information I cannot give you any information." Pt followed by hanging up.

## 2015-11-05 NOTE — Telephone Encounter (Signed)
Received refill request for sertraline which is prescribed by neurology, will make pharmacy aware.

## 2015-11-05 NOTE — Telephone Encounter (Signed)
Pt. Mother returned call and stated that the nurse did not want to give her any information regarding the pt.  She stated that pt. Does not understand and that is why all the information should be given to her.  Pt. Signed a release of information authorizing her mother to receive all her information.  Paper is scanned in pt. Chart. Please f/u

## 2015-11-06 ENCOUNTER — Emergency Department (HOSPITAL_COMMUNITY): Payer: Medicaid Other

## 2015-11-06 ENCOUNTER — Emergency Department (HOSPITAL_COMMUNITY)
Admission: EM | Admit: 2015-11-06 | Discharge: 2015-11-06 | Disposition: A | Discharge status: Home / Self Care | Payer: Medicaid Other | Attending: Emergency Medicine | Admitting: Emergency Medicine

## 2015-11-06 ENCOUNTER — Encounter (HOSPITAL_COMMUNITY): Payer: Self-pay | Admitting: *Deleted

## 2015-11-06 DIAGNOSIS — R4182 Altered mental status, unspecified: Secondary | ICD-10-CM | POA: Diagnosis present

## 2015-11-06 DIAGNOSIS — Z87891 Personal history of nicotine dependence: Secondary | ICD-10-CM | POA: Diagnosis not present

## 2015-11-06 LAB — URINALYSIS, ROUTINE W REFLEX MICROSCOPIC
Bilirubin Urine: NEGATIVE
GLUCOSE, UA: NEGATIVE mg/dL
HGB URINE DIPSTICK: NEGATIVE
Ketones, ur: NEGATIVE mg/dL
LEUKOCYTES UA: NEGATIVE
Nitrite: NEGATIVE
PH: 7.5 (ref 5.0–8.0)
Protein, ur: NEGATIVE mg/dL
SPECIFIC GRAVITY, URINE: 1.02 (ref 1.005–1.030)

## 2015-11-06 LAB — COMPREHENSIVE METABOLIC PANEL
ALBUMIN: 3.9 g/dL (ref 3.5–5.0)
ALK PHOS: 64 U/L (ref 38–126)
ALT: 44 U/L (ref 14–54)
ANION GAP: 7 (ref 5–15)
AST: 68 U/L — ABNORMAL HIGH (ref 15–41)
BILIRUBIN TOTAL: 0.3 mg/dL (ref 0.3–1.2)
BUN: 11 mg/dL (ref 6–20)
CALCIUM: 9 mg/dL (ref 8.9–10.3)
CO2: 23 mmol/L (ref 22–32)
CREATININE: 0.89 mg/dL (ref 0.44–1.00)
Chloride: 107 mmol/L (ref 101–111)
GFR calc Af Amer: >60 mL/min (ref >60)
GFR calc non Af Amer: >60 mL/min (ref >60)
GLUCOSE: 109 mg/dL — AB (ref 65–99)
Potassium: 4.1 mmol/L (ref 3.5–5.1)
Sodium: 137 mmol/L (ref 135–145)
TOTAL PROTEIN: 8.1 g/dL (ref 6.5–8.1)

## 2015-11-06 LAB — CBG MONITORING, ED: Glucose-Capillary: 110 mg/dL — ABNORMAL HIGH (ref 65–99)

## 2015-11-06 LAB — CBC
HEMATOCRIT: 37 % (ref 36.0–46.0)
HEMOGLOBIN: 12.1 g/dL (ref 12.0–15.0)
MCH: 27.8 pg (ref 26.0–34.0)
MCHC: 32.7 g/dL (ref 30.0–36.0)
MCV: 85.1 fL (ref 78.0–100.0)
Platelets: 125 10*3/uL — ABNORMAL LOW (ref 150–400)
RBC: 4.35 MIL/uL (ref 3.87–5.11)
RDW: 13.5 % (ref 11.5–15.5)
WBC: 5.2 10*3/uL (ref 4.0–10.5)

## 2015-11-06 NOTE — ED Provider Notes (Signed)
CSN: [id number]     Arrival date & time 11/06/15  1110 History   First MD Initiated Contact with Patient 11/06/15 1135     Chief Complaint  Patient presents with  . Altered Mental Status   Level V caveat due to dementia.  Patient is a 60 y.o. female presenting with altered mental status.  Altered Mental Status Patient was brought in for altered mental status. Reportedly was little more confused than her baseline. May also not up and using the right side is much as before. Difficult history due to her dementia.  Past Medical History  Diagnosis Date  . HIV infection ([place])   . Substance abuse   . H/O head injury   . Seizures ([place])   . Dementia   . Depression    Past Surgical History  Procedure Laterality Date  . Unremarkable     Family History  Problem Relation Age of Onset  . Colon cancer Neg Hx   . Diabetes Mother   . Behavior problems Mother   . Headache Mother    Social History  Substance Use Topics  . Smoking status: Former Research scientist (life sciences)  . Smokeless tobacco: Never Used  . Alcohol Use: No   OB History    No data available     Review of Systems  Unable to perform ROS: Dementia      Allergies  Review of patient's allergies indicates no known allergies.  Home Medications   Prior to Admission medications   Medication Sig Start Date End Date Taking? Authorizing Provider  elvitegravir-cobicistat-emtricitabine-tenofovir (GENVOYA) 150-150-200-10 MG TABS tablet Take 1 tablet by mouth daily. 01/14/15  Yes Thayer Headings, MD  latanoprost (XALATAN) 0.005 % ophthalmic solution 1 drop in both eyes daily 05/05/15  Yes Historical Provider, MD  levETIRAcetam (KEPPRA) 1000 MG tablet Take 1 tablet (1,000 mg total) by mouth 2 (two) times daily. 10/13/15  Yes Penni Bombard, MD  LORazepam (ATIVAN) 1 MG tablet Take 1 tablet (1 mg total) by mouth every 8 (eight) hours as needed (agitation). 11/04/15  Yes Josalyn Funches, MD  sertraline (ZOLOFT) 50 MG tablet Take 3 tablets (150 mg total)  by mouth daily. 10/20/14  Yes Penni Bombard, MD  feeding supplement, ENSURE ENLIVE, (ENSURE ENLIVE) LIQD Take 237 mLs by mouth 2 (two) times daily between meals. ICD 10 code: E66, R63.6 06/26/15   Thayer Headings, MD  Multiple Vitamin (MULTIVITAMIN) tablet Take 1 tablet by mouth daily.      Historical Provider, MD   BP 127/76 mmHg  Pulse 80  Temp(Src) 98.6 F (37 C) (Oral)  Resp 15  SpO2 100% Physical Exam  Constitutional: She appears well-developed.  HENT:  Head: Atraumatic.  Cardiovascular: Normal rate.   Pulmonary/Chest: Effort normal.  Abdominal: Soft.  Musculoskeletal:  Will move both upper extremities. Appears somewhat stronger left compared to right. Reviewing records appears to be chronic.  Neurological: She is alert.  Skin: Skin is warm.    ED Course  Procedures (including critical care time) Labs Review Labs Reviewed  COMPREHENSIVE METABOLIC PANEL - Abnormal; Notable for the following:    Glucose, Bld 109 (*)    AST 68 (*)    All other components within normal limits  CBC - Abnormal; Notable for the following:    Platelets 125 (*)    All other components within normal limits  CBG MONITORING, ED - Abnormal; Notable for the following:    Glucose-Capillary 110 (*)    All other components within normal limits  URINALYSIS, ROUTINE W REFLEX MICROSCOPIC (NOT AT [facility])    Imaging Review Ct Head Wo Contrast  11/06/2015  CLINICAL DATA:  Altered mental status. EXAM: CT HEAD WITHOUT CONTRAST TECHNIQUE: Contiguous axial images were obtained from the base of the skull through the vertex without intravenous contrast. COMPARISON:  02/16/2015. FINDINGS: Diffusely enlarged ventricles and subarachnoid spaces. No intracranial hemorrhage, mass lesion or CT evidence of acute infarction. Unremarkable bones. Single ethmoid air cell with mucosal thickening or retention cyst bilaterally. IMPRESSION: No acute abnormality. Mild cerebral atrophy and moderate cerebellar atrophy without  significant change. Electronically Signed   By: Claudie Revering M.D.   On: 11/06/2015 12:30   Dg Chest Port 1 View  11/06/2015  CLINICAL DATA:  Altered mental status for 1 day, history HIV, dementia EXAM: PORTABLE CHEST 1 VIEW COMPARISON:  Portable exam 1308 hours compared to 02/16/2015 FINDINGS: Normal heart size, mediastinal contours, and pulmonary vascularity. Lungs clear. No pleural effusion or pneumothorax. Bones demineralized. IMPRESSION: No acute abnormalities. Electronically Signed   By: Lavonia Dana M.D.   On: 11/06/2015 13:25   I have personally reviewed and evaluated these images and lab results as part of my medical decision-making.   EKG Interpretation   Date/Time:  Thursday November 06 2015 11:39:43 EDT Ventricular Rate:  80 PR Interval:    QRS Duration: 74 QT Interval:  493 QTC Calculation: 569 R Axis:   72 Text Interpretation:  Sinus rhythm Nonspecific T abnrm, anterolateral  leads Prolonged QT interval No significant change since last tracing  Confirmed by Alvino Chapel  MD, Ovid Curd [phone]) on 11/06/2015 3:03:29 PM      MDM   Final diagnoses:  Altered mental status, unspecified altered mental status type    atient with mental status change. Lab work and CT reassuring.No infection. May be near baseline. Will discharge. Did have possibility of having a seizure but no seizure activity seen.Will discharge back home. Exam appears to be at baseline reviewing some previous notes.    Davonna Belling, MD 11/06/15 [phone]

## 2015-11-06 NOTE — Telephone Encounter (Signed)
Called pt mother. Pt mother verified name and date of birth. Notified pt mother that a rx for ativan has been sent to the pharmacy on file and can be picked up. Pt mother voiced understanding.

## 2015-11-06 NOTE — ED Notes (Addendum)
PT's home healthcare RN came in this am to find her laying prone on the floor.  She states pt normally can come down the stairs on her own.  Hx of dementia, however rn states pt more altered and less mobile than her norm.  cbg 131, bp 133/88, hr 82, 98% RA.  LSN last night.  R arm drift and R leg weakness per EMS.  Hx of seizures.

## 2015-11-06 NOTE — ED Notes (Signed)
Pt spoke the words, "YES'" & "[place]"

## 2015-11-06 NOTE — Discharge Instructions (Signed)
Confusion Confusion is the inability to think with your usual speed or clarity. Confusion may come on quickly or slowly over time. How quickly the confusion comes on depends on the cause. Confusion can be due to any number of causes. CAUSES   Concussion, head injury, or head trauma.  Seizures.  Stroke.  Fever.  Brain tumor.  Age related decreased brain function (dementia).  Heightened emotional states like rage or terror.  Mental illness in which the person loses the ability to determine what is real and what is not (hallucinations).  Infections such as a urinary tract infection (UTI).  Toxic effects from alcohol, drugs, or prescription medicines.  Dehydration and an imbalance of salts in the body (electrolytes).  Lack of sleep.  Low blood sugar (diabetes).  Low levels of oxygen from conditions such as chronic lung disorders.  Drug interactions or other medicine side effects.  Nutritional deficiencies, especially niacin, thiamine, vitamin C, or vitamin B.  Sudden drop in body temperature (hypothermia).  Change in routine, such as when traveling or hospitalized. SIGNS AND SYMPTOMS  People often describe their thinking as cloudy or unclear when they are confused. Confusion can also include feeling disoriented. That means you are unaware of where or who you are. You may also not know what the date or time is. If confused, you may also have difficulty paying attention, remembering, and making decisions. Some people also act aggressively when they are confused.  DIAGNOSIS  The medical evaluation of confusion may include:  Blood and urine tests.  X-rays.  Brain and nervous system tests.  Analyzing your brain waves (electroencephalogram or EEG).  Magnetic resonance imaging (MRI) of your head.  Computed tomography (CT) scan of your head.  Mental status tests in which your health care provider may ask many questions. Some of these questions may seem silly or strange,  but they are a very important test to help diagnose and treat confusion. TREATMENT  An admission to the hospital may not be needed, but a person with confusion should not be left alone. Stay with a family member or friend until the confusion clears. Avoid alcohol, pain relievers, or sedative drugs until you have fully recovered. Do not drive until directed by your health care provider. HOME CARE INSTRUCTIONS  What family and friends can do:  To find out if someone is confused, ask the person to state his or her name, age, and the date. If the person is unsure or answers incorrectly, he or she is confused.  Always introduce yourself, no matter how well the person knows you.  Often remind the person of his or her location.  Place a calendar and clock near the confused person.  Help the person with his or her medicines. You may want to use a pill box, an alarm as a reminder, or give the person each dose as prescribed.  Talk about current events and plans for the day.  Try to keep the environment calm, quiet, and peaceful.  Make sure the person keeps follow-up visits with his or her health care provider. PREVENTION  Ways to prevent confusion:  Avoid alcohol.  Eat a balanced diet.  Get enough sleep.  Take medicine only as directed by your health care provider.  Do not become isolated. Spend time with other people and make plans for your days.  Keep careful watch on your blood sugar levels if you are diabetic. SEEK IMMEDIATE MEDICAL CARE IF:   You develop severe headaches, repeated vomiting, seizures, blackouts, or   slurred speech.  There is increasing confusion, weakness, numbness, restlessness, or personality changes.  You develop a loss of balance, have marked dizziness, feel uncoordinated, or fall.  You have delusions, hallucinations, or develop severe anxiety.  Your family members think you need to be rechecked.   This information is not intended to replace advice given  to you by your health care provider. Make sure you discuss any questions you have with your health care provider.   Document Released: 06/03/2004 Document Revised: 05/17/2014 Document Reviewed: 06/01/2013 Elsevier Interactive Patient Education 2016 [employer]  

## 2015-11-06 NOTE — ED Notes (Signed)
Pt bladder scanned, resulted in 324ml of urine

## 2015-11-28 ENCOUNTER — Other Ambulatory Visit: Payer: Self-pay | Admitting: Pharmacist

## 2015-11-28 MED ORDER — SERTRALINE HCL 50 MG PO TABS: 150.0000 mg | ORAL_TABLET | Freq: Every day | ORAL | Status: DC

## 2015-12-02 ENCOUNTER — Other Ambulatory Visit: Payer: Self-pay | Admitting: *Deleted

## 2015-12-02 MED ORDER — ELVITEG-COBIC-EMTRICIT-TENOFAF 150-150-200-10 MG PO TABS: 1.0000 | ORAL_TABLET | Freq: Every day | ORAL | 11 refills | Status: DC

## 2015-12-11 ENCOUNTER — Other Ambulatory Visit: Payer: Self-pay

## 2015-12-16 ENCOUNTER — Encounter: Payer: Self-pay | Admitting: Family Medicine

## 2015-12-16 ENCOUNTER — Ambulatory Visit: Payer: Medicaid Other | Attending: Family Medicine | Admitting: Family Medicine

## 2015-12-16 VITALS — BP 94/65 | HR 84 | Temp 98.3°F | Ht 59.0 in | Wt 108.6 lb

## 2015-12-16 DIAGNOSIS — B20 Human immunodeficiency virus [HIV] disease: Secondary | ICD-10-CM | POA: Insufficient documentation

## 2015-12-16 DIAGNOSIS — Z87891 Personal history of nicotine dependence: Secondary | ICD-10-CM | POA: Insufficient documentation

## 2015-12-16 DIAGNOSIS — K089 Disorder of teeth and supporting structures, unspecified: Secondary | ICD-10-CM | POA: Diagnosis not present

## 2015-12-16 DIAGNOSIS — F0390 Unspecified dementia without behavioral disturbance: Secondary | ICD-10-CM | POA: Insufficient documentation

## 2015-12-16 DIAGNOSIS — K056 Periodontal disease, unspecified: Secondary | ICD-10-CM | POA: Insufficient documentation

## 2015-12-16 DIAGNOSIS — B350 Tinea barbae and tinea capitis: Secondary | ICD-10-CM | POA: Insufficient documentation

## 2015-12-16 DIAGNOSIS — R569 Unspecified convulsions: Secondary | ICD-10-CM | POA: Insufficient documentation

## 2015-12-16 DIAGNOSIS — Z79899 Other long term (current) drug therapy: Secondary | ICD-10-CM | POA: Diagnosis not present

## 2015-12-16 DIAGNOSIS — F329 Major depressive disorder, single episode, unspecified: Secondary | ICD-10-CM | POA: Diagnosis not present

## 2015-12-16 MED ORDER — TERBINAFINE HCL 250 MG PO TABS: 250.0000 mg | ORAL_TABLET | Freq: Every day | ORAL | 0 refills | Status: DC

## 2015-12-16 NOTE — Assessment & Plan Note (Signed)
Increased seizure activity with post ictal weakness that is transient Check keppra level Adjust as needed

## 2015-12-16 NOTE — Progress Notes (Signed)
C/C ED follow up

## 2015-12-16 NOTE — Assessment & Plan Note (Signed)
Tinea capitis per picture  Scalp exam is non focal Course of terbinafine

## 2015-12-16 NOTE — Progress Notes (Signed)
Subjective:  Patient ID: [name] [name], female    DOB: 1956-03-18  Age: 60 y.o. MRN: [id number]  CC: Follow-up   HPI [name] [name] has hx of HIV, dementia, malnutrition she  presents for   1. ED f/u AMS: she is here with her CNA. She has a seizures associated with R sided weakness on 11/06/15. She went to the ED.  CMP, CBC, UA, CT head and CXR normal. Weakness resolved after one week. Has had recurrent seizures. Last was this AM. Staring off episode associated with some weakness that was generalized. No tonic or clonic component. No incontinence. She is taking keppra. No fever.   2. Scalp lesion: noted by her CNA while washing and braiding her hair.  Non tender and non pruritic per patient. No fever. Lesion is not spreading.   Social History  Substance Use Topics  . Smoking status: Former Research scientist (life sciences)  . Smokeless tobacco: Never Used  . Alcohol use No   Outpatient Medications Prior to Visit  Medication Sig Dispense Refill  . elvitegravir-cobicistat-emtricitabine-tenofovir (GENVOYA) 150-150-200-10 MG TABS tablet Take 1 tablet by mouth daily. 30 tablet 11  . feeding supplement, ENSURE ENLIVE, (ENSURE ENLIVE) LIQD Take 237 mLs by mouth 2 (two) times daily between meals. ICD 10 code: E43, R63.6 14220 mL 5  . latanoprost (XALATAN) 0.005 % ophthalmic solution 1 drop in both eyes daily  3  . levETIRAcetam (KEPPRA) 1000 MG tablet Take 1 tablet (1,000 mg total) by mouth 2 (two) times daily. 180 tablet 4  . LORazepam (ATIVAN) 1 MG tablet Take 1 tablet (1 mg total) by mouth every 8 (eight) hours as needed (agitation). 60 tablet 1  . Multiple Vitamin (MULTIVITAMIN) tablet Take 1 tablet by mouth daily.      . sertraline (ZOLOFT) 50 MG tablet Take 3 tablets (150 mg total) by mouth daily. 90 tablet 0   No facility-administered medications prior to visit.     ROS Review of Systems  Constitutional: Negative for chills and fever.  Eyes: Negative for visual disturbance.  Respiratory: Negative for  shortness of breath.   Cardiovascular: Negative for chest pain.  Gastrointestinal: Negative for abdominal pain and blood in stool.  Musculoskeletal: Negative for arthralgias and back pain.  Skin: Positive for rash.  Allergic/Immunologic: Negative for immunocompromised state.  Neurological: Positive for seizures and weakness. Negative for dizziness, tremors, syncope, facial asymmetry, speech difficulty, light-headedness, numbness and headaches.  Hematological: Negative for adenopathy. Does not bruise/bleed easily.  Psychiatric/Behavioral: Negative for dysphoric mood and suicidal ideas.    Objective:  BP 94/65 (BP Location: Left Arm, Patient Position: Sitting, Cuff Size: Small)   Pulse 84   Temp 98.3 F (36.8 C) (Oral)   Ht 4\' 11"  (1.499 m)   Wt 108 lb 9.6 oz (49.3 kg)   SpO2 96%   BMI 21.93 kg/m   BP/Weight 12/16/2015 123456 [id number]  Systolic BP 94 [id number]  Diastolic BP 65 76 71  Wt. (Lbs) 108.6 - 103.4  BMI 21.93 - 20.87    Physical Exam  Constitutional: She is oriented to person, place, and time. She appears well-developed and well-nourished. No distress.  HENT:  Head: Normocephalic and atraumatic.  Mouth/Throat: Abnormal dentition.  Hair in braids no lesions on exam   Cardiovascular: Normal rate, regular rhythm, normal heart sounds and intact distal pulses.   Pulmonary/Chest: Effort normal and breath sounds normal.  Musculoskeletal: She exhibits no edema.  Lymphadenopathy:       Head (right side): No posterior auricular and  no occipital adenopathy present.       Head (left side): No posterior auricular and no occipital adenopathy present.  Neurological: She is alert and oriented to person, place, and time. She has normal strength. No cranial nerve deficit or sensory deficit. She displays a negative Romberg sign.  Skin: Skin is warm and dry. No rash noted.  Psychiatric: She has a normal mood and affect.     Assessment & Plan:  [name] [name] was seen today for  follow-up.  Diagnoses and all orders for this visit:  Seizures ([place]) -     Levetiracetam level  Poor dentition -     Ambulatory referral to Dentistry  Tinea capitis -     terbinafine (LAMISIL) 250 MG tablet; Take 1 tablet (250 mg total) by mouth daily.   There are no diagnoses linked to this encounter.  No orders of the defined types were placed in this encounter.   Follow-up: Return in about 8 weeks (around 02/10/2016) for flu shot, recheck scalp .   Boykin Nearing MD

## 2015-12-16 NOTE — Patient Instructions (Addendum)
[name] [name] was seen today for follow-up.  Diagnoses and all orders for this visit:  Seizures ([place]) -     Levetiracetam level  Poor dentition -     Ambulatory referral to Dentistry  Tinea capitis -     terbinafine (LAMISIL) 250 MG tablet; Take 1 tablet (250 mg total) by mouth daily.    F/u in 8 weeks for recheck of scalp and flu shot   Dr. Adrian Blackwater

## 2015-12-19 LAB — LEVETIRACETAM LEVEL: KEPPRA (LEVETIRACETAM): 64.2 ug/mL

## 2015-12-22 ENCOUNTER — Telehealth: Payer: Self-pay

## 2015-12-22 NOTE — Telephone Encounter (Signed)
-----   Message from Boykin Nearing, MD sent at 12/22/2015  8:27 AM EDT ----- keppra level is on higher end suggesting that dose is sufficient, no dose change recommended

## 2015-12-22 NOTE — Telephone Encounter (Signed)
Attempted to advise patient per Dr. Adrian Blackwater: Keppra level is on higher end suggesting that dose is sufficient, no dose change recommended.

## 2015-12-24 ENCOUNTER — Telehealth: Payer: Self-pay

## 2015-12-24 NOTE — Telephone Encounter (Signed)
Left results with mother([name])

## 2015-12-25 ENCOUNTER — Ambulatory Visit: Payer: Self-pay | Admitting: Internal Medicine

## 2015-12-25 NOTE — Telephone Encounter (Signed)
Review of chart indicates results provided to mother, no further action needed at this time. Priscille Heidelberg, RN, BSN

## 2015-12-30 ENCOUNTER — Telehealth: Payer: Self-pay | Admitting: Family Medicine

## 2015-12-30 NOTE — Telephone Encounter (Signed)
Pt. Aide called requesting to speak with pt. Nurse. She did not give me any details regarding the reason for the call and stated it was important. Please f.u

## 2015-12-31 NOTE — Telephone Encounter (Signed)
Patients nurse aide called to speak with nurse, she stated that she has a question in regards to the patients medications. Please f/u

## 2016-01-01 NOTE — Telephone Encounter (Signed)
Called back to [place] is the CNA for Ms. Bilski and her mother.   Her question is that patient needs a nurse to administer medications at home and accompany patient to office visit.  Ambrose Finland is a CNA and can no longer perform these duties  She needs a request for PCS (personal care services)    Plan: Will fill out a PCS form    Alycia, please start a PCS form for me. They are in the pink book.  You can use the inform form the last OV regarding diagnoses.  I will sign it and add any additional info when I return

## 2016-01-02 NOTE — Telephone Encounter (Signed)
Paperwork is filled out and put in mailbox.

## 2016-01-05 NOTE — Telephone Encounter (Signed)
Thank you, I will review and sign when I return to the office

## 2016-01-08 ENCOUNTER — Ambulatory Visit: Payer: Self-pay | Admitting: Internal Medicine

## 2016-01-08 NOTE — Telephone Encounter (Signed)
Form reviewed and signed today

## 2016-01-22 ENCOUNTER — Telehealth: Payer: Self-pay | Admitting: Family Medicine

## 2016-01-22 ENCOUNTER — Ambulatory Visit: Payer: Self-pay | Admitting: Internal Medicine

## 2016-01-22 NOTE — Telephone Encounter (Signed)
Patient called to report that [name] [name] needs further explanation as to why patient needs home health nurse to administer medications.  Please follow up with CMA

## 2016-01-23 ENCOUNTER — Other Ambulatory Visit: Payer: Self-pay

## 2016-01-23 DIAGNOSIS — E43 Unspecified severe protein-calorie malnutrition: Secondary | ICD-10-CM

## 2016-01-23 DIAGNOSIS — R636 Underweight: Secondary | ICD-10-CM

## 2016-01-23 NOTE — Telephone Encounter (Signed)
Paperwork will be faxed over on 9/15. 

## 2016-02-03 ENCOUNTER — Telehealth: Payer: Self-pay | Admitting: Family Medicine

## 2016-02-03 MED ORDER — SERTRALINE HCL 50 MG PO TABS: 150.0000 mg | ORAL_TABLET | Freq: Every day | ORAL | 2 refills | Status: DC

## 2016-02-03 NOTE — Telephone Encounter (Signed)
Pt's nurse called to request refill for sertraline (ZOLOFT) 50 MG tablet. [facility] has been trying to fax over request but fax was not work. Please follow up.  Thank you

## 2016-02-03 NOTE — Telephone Encounter (Signed)
Sertraline refilled.  

## 2016-02-05 ENCOUNTER — Ambulatory Visit: Payer: Self-pay | Admitting: Internal Medicine

## 2016-02-10 ENCOUNTER — Encounter: Payer: Self-pay | Admitting: Family Medicine

## 2016-02-10 ENCOUNTER — Ambulatory Visit: Payer: Medicaid Other | Attending: Family Medicine | Admitting: Family Medicine

## 2016-02-10 VITALS — BP 122/80 | HR 78 | Temp 98.4°F | Ht 59.0 in | Wt 107.6 lb

## 2016-02-10 DIAGNOSIS — F0390 Unspecified dementia without behavioral disturbance: Secondary | ICD-10-CM | POA: Insufficient documentation

## 2016-02-10 DIAGNOSIS — B20 Human immunodeficiency virus [HIV] disease: Secondary | ICD-10-CM | POA: Diagnosis not present

## 2016-02-10 DIAGNOSIS — B350 Tinea barbae and tinea capitis: Secondary | ICD-10-CM

## 2016-02-10 DIAGNOSIS — L639 Alopecia areata, unspecified: Secondary | ICD-10-CM

## 2016-02-10 DIAGNOSIS — Z23 Encounter for immunization: Secondary | ICD-10-CM | POA: Diagnosis not present

## 2016-02-10 DIAGNOSIS — E46 Unspecified protein-calorie malnutrition: Secondary | ICD-10-CM | POA: Insufficient documentation

## 2016-02-10 MED ORDER — KETOCONAZOLE 2 % EX SHAM: 1.0000 "application " | MEDICATED_SHAMPOO | CUTANEOUS | 0 refills | Status: DC

## 2016-02-10 NOTE — Patient Instructions (Addendum)
Travina was seen today for follow-up.  Diagnoses and all orders for this visit:  Alopecia areata -     Ambulatory referral to Dermatology  Tinea capitis -     ketoconazole (NIZORAL) 2 % shampoo; Apply 1 application topically 2 (two) times a week.   F/u in 3 months for tinea capitis   Dr. Adrian Blackwater

## 2016-02-10 NOTE — Progress Notes (Signed)
Pt is getting flu shot today.  recheck scalp.

## 2016-02-10 NOTE — Progress Notes (Signed)
Subjective:  Patient ID: [name] [name], female    DOB: [birth year]  Age: 60 y.o. MRN: [id number]  CC: Follow-up (scalp recheck)   HPI [name] [name] has hx of HIV, dementia, malnutrition she  presents for   1. Scalp lesion: noted by her CNA while washing and braiding her hair.  Non tender and non pruritic per patient. No fever. Lesion is not spreading. She has completed oral Lamisil 250 mg x 30 day course. She still has scaly patch in R occipital area. She also has pach of hairloss at L temple.   Social History  Substance Use Topics  . Smoking status: Former Research scientist (life sciences)  . Smokeless tobacco: Never Used  . Alcohol use No   Outpatient Medications Prior to Visit  Medication Sig Dispense Refill  . elvitegravir-cobicistat-emtricitabine-tenofovir (GENVOYA) 150-150-200-10 MG TABS tablet Take 1 tablet by mouth daily. 30 tablet 11  . feeding supplement, ENSURE ENLIVE, (ENSURE ENLIVE) LIQD Take 237 mLs by mouth 2 (two) times daily between meals. ICD 10 code: E43, R63.6 14220 mL 5  . latanoprost (XALATAN) 0.005 % ophthalmic solution 1 drop in both eyes daily  3  . levETIRAcetam (KEPPRA) 1000 MG tablet Take 1 tablet (1,000 mg total) by mouth 2 (two) times daily. 180 tablet 4  . LORazepam (ATIVAN) 1 MG tablet Take 1 tablet (1 mg total) by mouth every 8 (eight) hours as needed (agitation). 60 tablet 1  . Multiple Vitamin (MULTIVITAMIN) tablet Take 1 tablet by mouth daily.      . sertraline (ZOLOFT) 50 MG tablet Take 3 tablets (150 mg total) by mouth daily. 90 tablet 2  . terbinafine (LAMISIL) 250 MG tablet Take 1 tablet (250 mg total) by mouth daily. 30 tablet 0   No facility-administered medications prior to visit.     ROS Review of Systems  Constitutional: Negative for chills and fever.  Eyes: Negative for visual disturbance.  Respiratory: Negative for shortness of breath.   Cardiovascular: Negative for chest pain.  Gastrointestinal: Negative for abdominal pain and blood in stool.    Musculoskeletal: Negative for arthralgias and back pain.  Skin: Positive for rash.  Allergic/Immunologic: Negative for immunocompromised state.  Neurological: Positive for seizures and weakness. Negative for dizziness, tremors, syncope, facial asymmetry, speech difficulty, light-headedness, numbness and headaches.  Hematological: Negative for adenopathy. Does not bruise/bleed easily.  Psychiatric/Behavioral: Negative for dysphoric mood and suicidal ideas.    Objective:  BP 122/80 (BP Location: Right Arm, Patient Position: Sitting, Cuff Size: Small)   Pulse 78   Temp 98.4 F (36.9 C) (Oral)   Ht 4\' 11"  (1.499 m)   Wt 107 lb 9.6 oz (48.8 kg)   SpO2 98%   BMI 21.73 kg/m   BP/Weight 02/10/2016 12/16/2015 123456  Systolic BP [id number] 94 [id number]  Diastolic BP 80 65 76  Wt. (Lbs) 107.6 108.6 -  BMI 21.73 21.93 -    Wt Readings from Last 3 Encounters:  02/10/16 107 lb 9.6 oz (48.8 kg)  12/16/15 108 lb 9.6 oz (49.3 kg)  10/13/15 103 lb 6.4 oz (46.9 kg)     Physical Exam  Constitutional: She is oriented to person, place, and time. She appears well-developed and well-nourished. No distress.  HENT:  Head: Normocephalic and atraumatic.    Cardiovascular: Normal rate, regular rhythm, normal heart sounds and intact distal pulses.   Pulmonary/Chest: Effort normal and breath sounds normal.  Musculoskeletal: She exhibits no edema.  Lymphadenopathy:       Head (right side): No posterior auricular  and no occipital adenopathy present.       Head (left side): No posterior auricular and no occipital adenopathy present.  Neurological: She is alert and oriented to person, place, and time. No cranial nerve deficit.  Skin: Skin is warm and dry. No rash noted.  Psychiatric: She has a normal mood and affect.     Assessment & Plan:  [name] [name] was seen today for follow-up.  Diagnoses and all orders for this visit:  Alopecia areata -     Ambulatory referral to Dermatology  Tinea capitis -      ketoconazole (NIZORAL) 2 % shampoo; Apply 1 application topically 2 (two) times a week.  Encounter for immunization -     Flu Vaccine QUAD 36+ mos IM   There are no diagnoses linked to this encounter.  No orders of the defined types were placed in this encounter.   Follow-up: Return in about 3 months (around 05/12/2016) for tinea capitis .   Boykin Nearing MD

## 2016-02-11 NOTE — Assessment & Plan Note (Signed)
Persistent tinea capitis no responsive to 30 days of terbinafine in HIV positive patient who now has patch of hair loss   Plan: Dermatology referral  Topical nizoral shampoo She will likely need either extended course of terbinafine or griseofulvin

## 2016-02-17 ENCOUNTER — Ambulatory Visit: Payer: Self-pay | Admitting: Internal Medicine

## 2016-02-24 ENCOUNTER — Other Ambulatory Visit (HOSPITAL_COMMUNITY)
Admission: RE | Admit: 2016-02-24 | Discharge: 2016-02-24 | Disposition: A | Discharge status: Home / Self Care | Payer: Medicaid Other | Source: Ambulatory Visit | Attending: Internal Medicine | Admitting: Internal Medicine

## 2016-02-24 ENCOUNTER — Ambulatory Visit (INDEPENDENT_AMBULATORY_CARE_PROVIDER_SITE_OTHER): Payer: Medicaid Other | Admitting: Internal Medicine

## 2016-02-24 ENCOUNTER — Encounter: Payer: Self-pay | Admitting: Internal Medicine

## 2016-02-24 ENCOUNTER — Other Ambulatory Visit: Payer: Self-pay | Admitting: *Deleted

## 2016-02-24 VITALS — BP 123/86 | HR 75 | Temp 97.7°F | Wt 106.8 lb

## 2016-02-24 DIAGNOSIS — B20 Human immunodeficiency virus [HIV] disease: Secondary | ICD-10-CM | POA: Diagnosis present

## 2016-02-24 DIAGNOSIS — E43 Unspecified severe protein-calorie malnutrition: Secondary | ICD-10-CM

## 2016-02-24 DIAGNOSIS — Z113 Encounter for screening for infections with a predominantly sexual mode of transmission: Secondary | ICD-10-CM

## 2016-02-24 DIAGNOSIS — R636 Underweight: Secondary | ICD-10-CM

## 2016-02-24 MED ORDER — ENSURE ENLIVE PO LIQD: 237.0000 mL | Freq: Two times a day (BID) | ORAL | 11 refills | Status: DC

## 2016-02-24 NOTE — Assessment & Plan Note (Signed)
Doing well.  Labs today and rtc 6 months.  

## 2016-02-24 NOTE — Progress Notes (Signed)
  Subjective:    Patient ID: [name] [name], female    DOB: 1955-12-24, 60 y.o.   MRN: [id number]  HPI She comes in for followup of her HIV.  She has been on Atripla and denies any missed doses. She is given her medications by her mother and her home aide is with her today.  She has no new complaints.  No diarrhea, no rash.    Seizure in June 2017.  Was also started on Lamisil.   Review of Systems  Constitutional: Negative for fatigue and unexpected weight change.  HENT: Negative for ear discharge, sore throat and trouble swallowing.   Gastrointestinal: Negative for diarrhea.  Skin: Negative for rash.  Neurological: Negative for dizziness and headaches.  Psychiatric/Behavioral: Negative for dysphoric mood.       Objective:   Physical Exam  Constitutional: She appears well-developed and well-nourished. No distress.  HENT:  Mouth/Throat: No oropharyngeal exudate.  Eyes: Conjunctivae are normal. Right eye exhibits no discharge. Left eye exhibits no discharge. No scleral icterus.  Cardiovascular: Normal rate, regular rhythm and normal heart sounds.   No murmur heard. Pulmonary/Chest: Effort normal and breath sounds normal. No respiratory distress.  Lymphadenopathy:    She has no cervical adenopathy.    She has no axillary adenopathy.       Right: No supraclavicular adenopathy present.       Left: No supraclavicular adenopathy present.  Skin: No rash noted.          Assessment & Plan:

## 2016-02-24 NOTE — Addendum Note (Signed)
Addended by: Lorne Skeens D on: 02/24/2016 11:51 AM   Modules accepted: Orders

## 2016-02-24 NOTE — Assessment & Plan Note (Signed)
Will screen today 

## 2016-02-25 LAB — CBC WITH DIFFERENTIAL/PLATELET
BASOS ABS: 43 {cells}/uL (ref 0–200)
BASOS PCT: 1 %
EOS ABS: 129 {cells}/uL (ref 15–500)
EOS PCT: 3 %
HCT: 35.5 % (ref 35.0–45.0)
Hemoglobin: 12.1 g/dL (ref 11.7–15.5)
LYMPHS PCT: 37 %
Lymphs Abs: 1591 cells/uL (ref 850–3900)
MCH: 27.3 pg (ref 27.0–33.0)
MCHC: 34.1 g/dL (ref 32.0–36.0)
MCV: 80 fL (ref 80.0–100.0)
MONOS PCT: 5 %
MPV: 10.9 fL (ref 7.5–12.5)
Monocytes Absolute: 215 cells/uL (ref 200–950)
NEUTROS ABS: 2322 {cells}/uL (ref 1500–7800)
Neutrophils Relative %: 54 %
PLATELETS: 146 10*3/uL (ref 140–400)
RBC: 4.44 MIL/uL (ref 3.80–5.10)
RDW: 13.2 % (ref 11.0–15.0)
WBC: 4.3 10*3/uL (ref 3.8–10.8)

## 2016-02-25 LAB — COMPLETE METABOLIC PANEL WITH GFR
ALBUMIN: 4 g/dL (ref 3.6–5.1)
ALK PHOS: 129 U/L (ref 33–130)
ALT: 67 U/L — AB (ref 6–29)
AST: 94 U/L — ABNORMAL HIGH (ref 10–35)
BILIRUBIN TOTAL: 0.4 mg/dL (ref 0.2–1.2)
BUN: 12 mg/dL (ref 7–25)
CALCIUM: 9.1 mg/dL (ref 8.6–10.4)
CO2: 27 mmol/L (ref 20–31)
CREATININE: 0.94 mg/dL (ref 0.50–1.05)
Chloride: 102 mmol/L (ref 98–110)
GFR, EST AFRICAN AMERICAN: 77 mL/min (ref >=60)
GFR, EST NON AFRICAN AMERICAN: 67 mL/min (ref >=60)
Glucose, Bld: 129 mg/dL — ABNORMAL HIGH (ref 65–99)
Potassium: 4.1 mmol/L (ref 3.5–5.3)
Sodium: 139 mmol/L (ref 135–146)
TOTAL PROTEIN: 8.4 g/dL — AB (ref 6.1–8.1)

## 2016-02-25 LAB — URINE CYTOLOGY ANCILLARY ONLY
Chlamydia: NEGATIVE
Neisseria Gonorrhea: NEGATIVE

## 2016-02-25 LAB — RPR

## 2016-02-25 LAB — T-HELPER CELL (CD4) - (RCID CLINIC ONLY)
CD4 % Helper T Cell: 30 % — ABNORMAL LOW (ref 33–55)
CD4 T CELL ABS: 490 /uL (ref 400–2700)

## 2016-02-26 LAB — HIV-1 RNA QUANT-NO REFLEX-BLD: HIV 1 RNA Quant: <20 copies/mL (ref <20)

## 2016-03-05 ENCOUNTER — Other Ambulatory Visit: Payer: Self-pay | Admitting: Pharmacist

## 2016-03-05 DIAGNOSIS — B350 Tinea barbae and tinea capitis: Secondary | ICD-10-CM

## 2016-03-05 MED ORDER — KETOCONAZOLE 2 % EX SHAM: 1.0000 "application " | MEDICATED_SHAMPOO | CUTANEOUS | 0 refills | Status: AC

## 2016-03-12 ENCOUNTER — Ambulatory Visit: Payer: Self-pay

## 2016-04-22 ENCOUNTER — Other Ambulatory Visit: Payer: Self-pay | Admitting: Family Medicine

## 2016-04-22 MED ORDER — SERTRALINE HCL 50 MG PO TABS: 150.0000 mg | ORAL_TABLET | Freq: Every day | ORAL | 5 refills | Status: DC

## 2016-04-22 MED ORDER — LORAZEPAM 1 MG PO TABS
1.0000 mg | ORAL_TABLET | Freq: Three times a day (TID) | ORAL | 2 refills | Status: DC | PRN
Start: 2016-04-22 — End: 2016-08-24

## 2016-04-22 NOTE — Telephone Encounter (Signed)
Phoned in lorazepam refill Call completed

## 2016-06-03 ENCOUNTER — Other Ambulatory Visit: Payer: Self-pay | Admitting: *Deleted

## 2016-06-03 DIAGNOSIS — R636 Underweight: Secondary | ICD-10-CM

## 2016-06-03 DIAGNOSIS — E43 Unspecified severe protein-calorie malnutrition: Secondary | ICD-10-CM

## 2016-06-03 MED ORDER — ENSURE ENLIVE PO LIQD: 237.0000 mL | Freq: Two times a day (BID) | ORAL | 11 refills | Status: DC

## 2016-06-12 IMAGING — CT CT HEAD W/O CM
2 series · 16 of 30 positions shown, 20 images · non-contrast
Comparison: Prior CT from 05/23/2014

CLINICAL DATA: Initial evaluation for acute altered mental status.

EXAM:
CT HEAD WITHOUT CONTRAST
TECHNIQUE: Contiguous axial images were obtained from the base of the skull
through the vertex without intravenous contrast.

[Series 2: head w/o · axial · non-contrast · 0.40mm/px · z∈[-166,-41]mm · 13 of 31 slices shown, 17 images]
[im 3/31  brain]
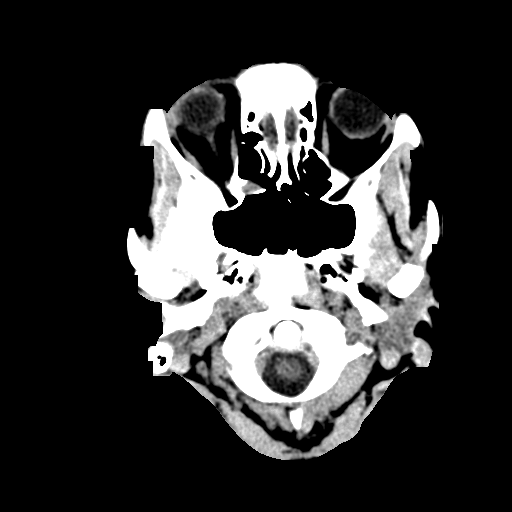
[im 3/31  bone]
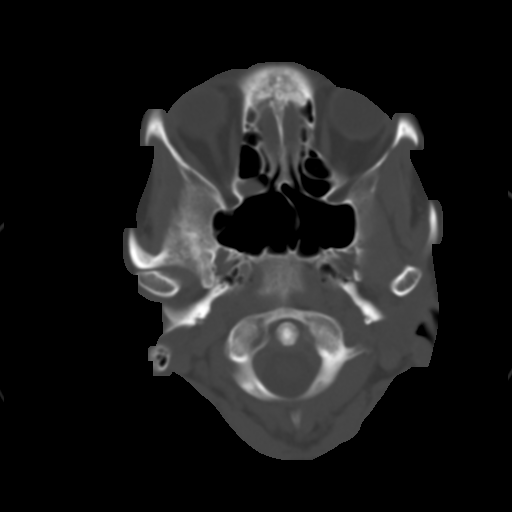
[im 5/31  brain]
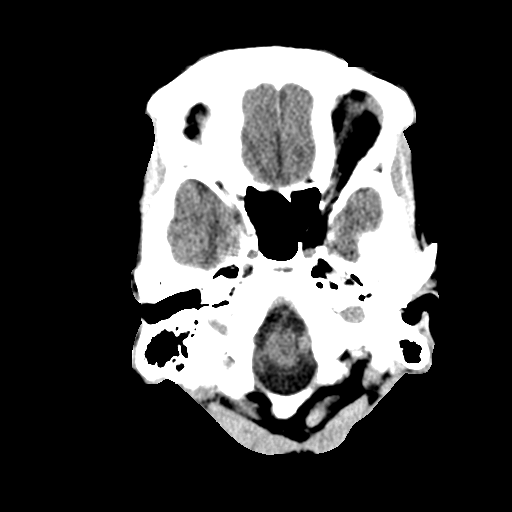
[im 7/31  brain]
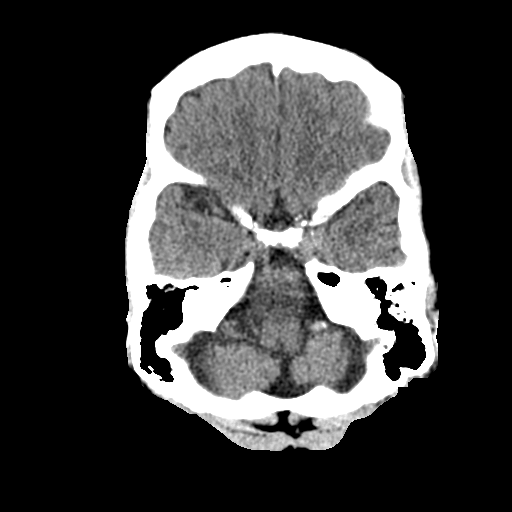
[im 9/31  brain]
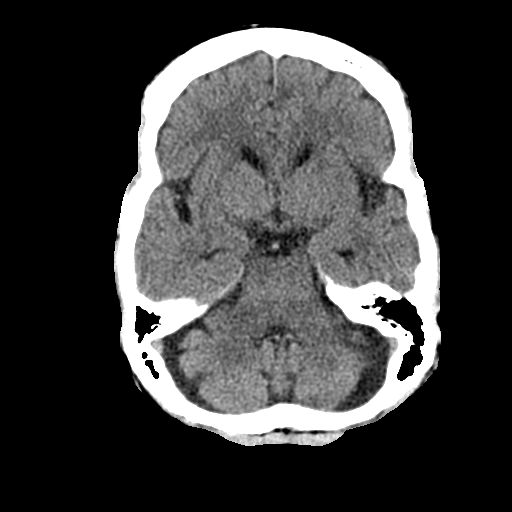
[im 11/31  brain]
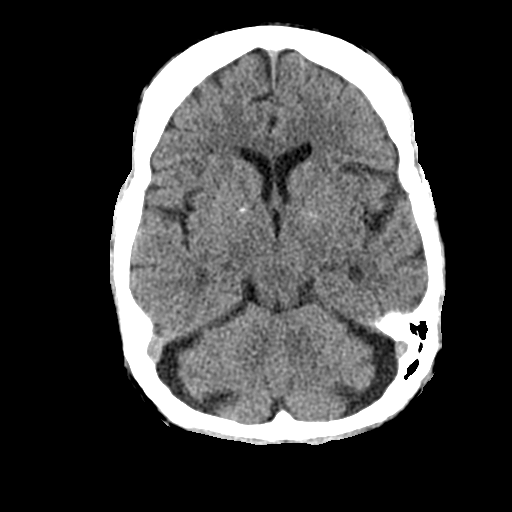
[im 11/31  bone]
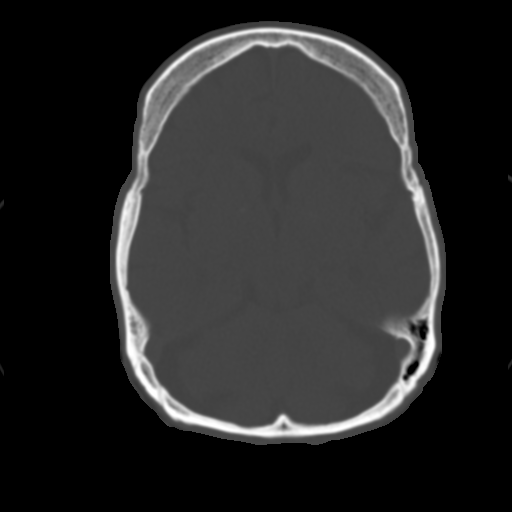
[im 13/31  brain]
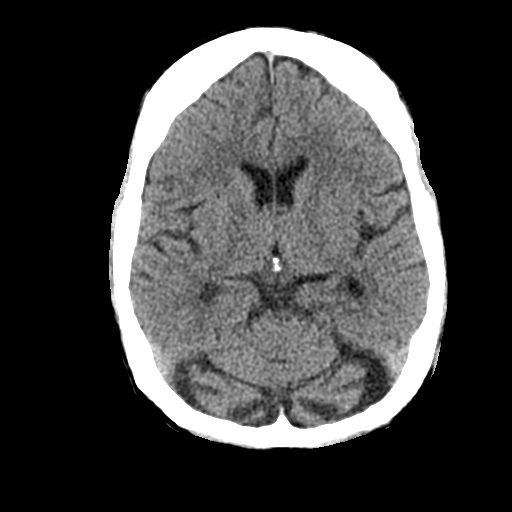
[im 16/31  brain]
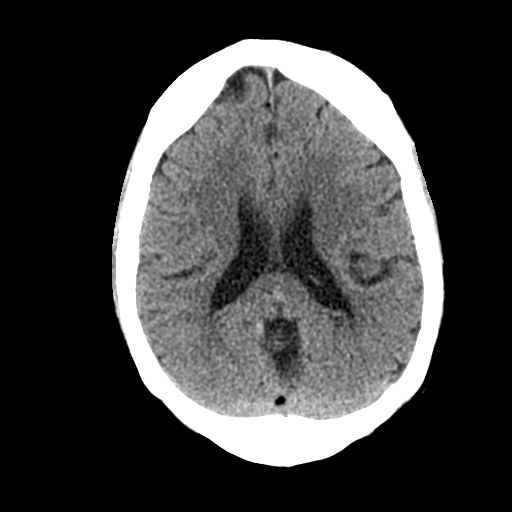
[im 18/31  brain]
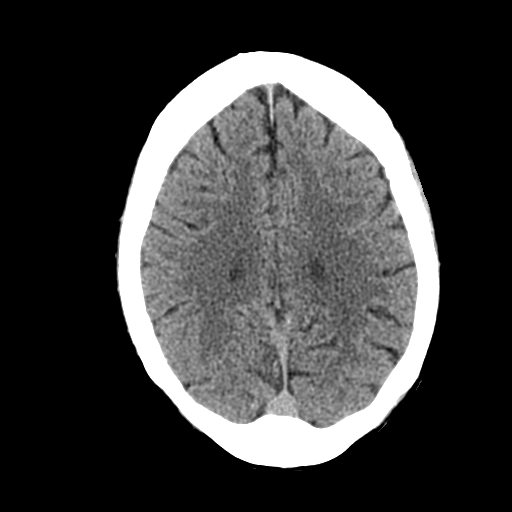
[im 20/31  brain]
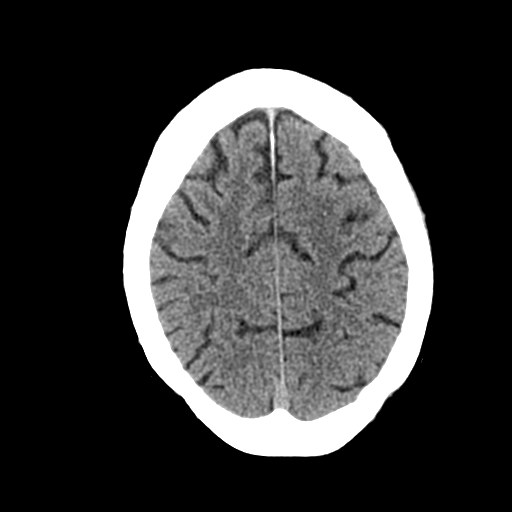
[im 20/31  bone]
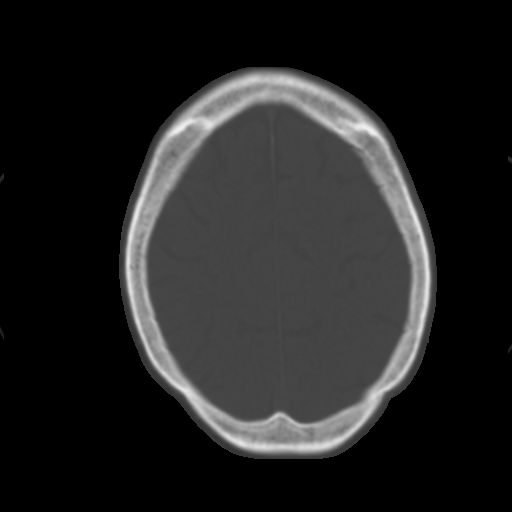
[im 22/31  brain]
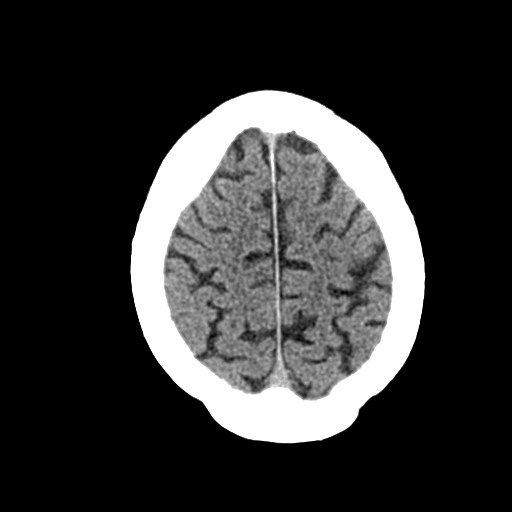
[im 24/31  brain]
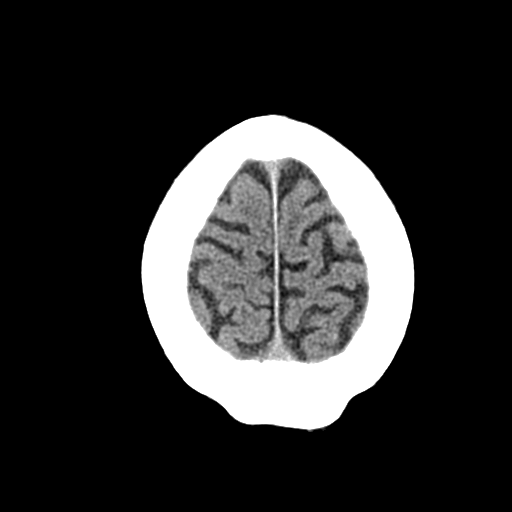
[im 26/31  brain]
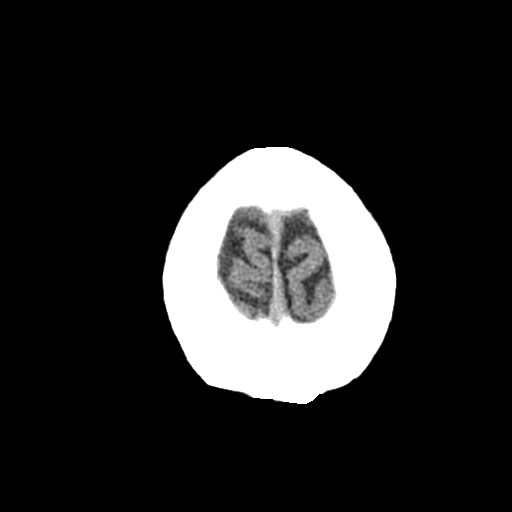
[im 28/31  brain]
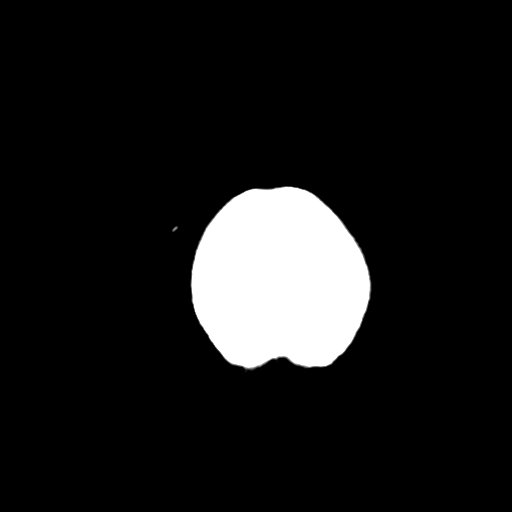
[im 28/31  bone]
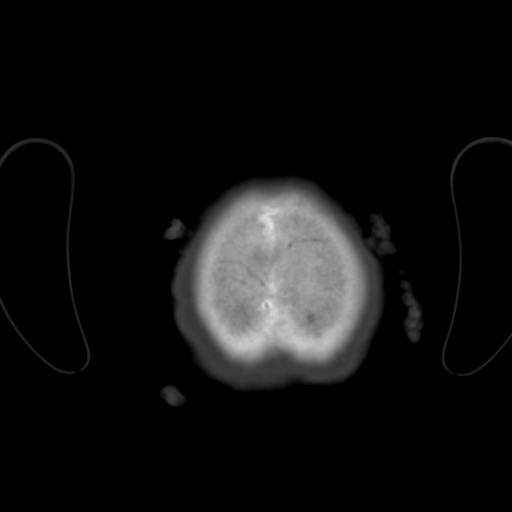

[Series 3: bone windows · axial · 0.40mm/px · z∈[-166,-126]mm · 3 of 31 slices shown]
[im 3/31  bone]
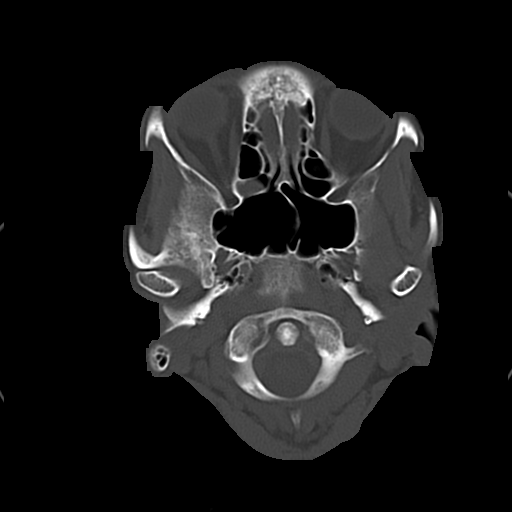
[im 7/31  bone]
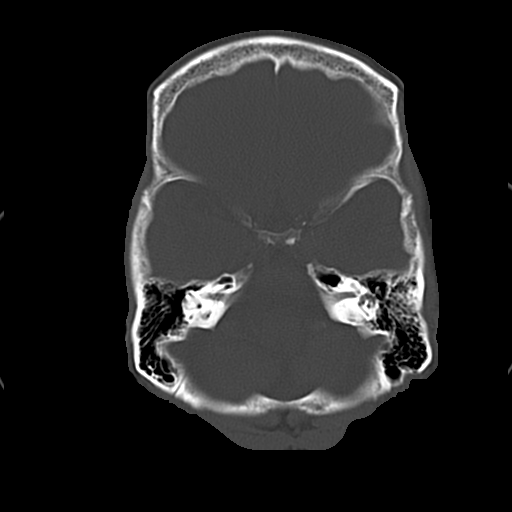
[im 11/31  bone]
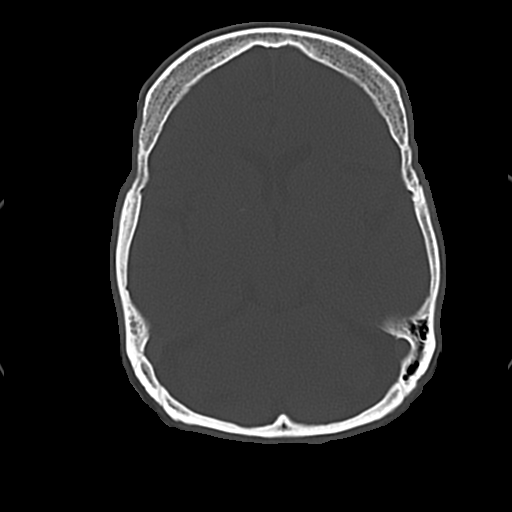

[16 of 30 positions shown; findings below may reference images not displayed]

FINDINGS: Moderate cerebral and cerebellar atrophy again seen, similar to
previous. Mild chronic small vessel ischemic changes present within
the periventricular white matter.

No acute large vessel territory infarct or hemorrhage. No mass
lesion, midline shift, or mass effect. No hydrocephalus. No
extra-axial fluid collection.

Scalp soft tissues within normal limits. No acute abnormality about
the orbits.

Calvarium intact. Minimal opacity present within the ethmoidal air
cells bilaterally. Paranasal sinuses are otherwise clear. No mastoid
effusion.
IMPRESSION: 1. No acute intracranial process.
2. Moderate generalized atrophy, stable from prior.

## 2016-08-09 ENCOUNTER — Other Ambulatory Visit: Payer: Self-pay | Admitting: Family Medicine

## 2016-08-09 DIAGNOSIS — Z1231 Encounter for screening mammogram for malignant neoplasm of breast: Secondary | ICD-10-CM

## 2016-08-24 ENCOUNTER — Other Ambulatory Visit: Payer: Self-pay | Admitting: Family Medicine

## 2016-08-24 MED ORDER — LORAZEPAM 1 MG PO TABS: 1.0000 mg | ORAL_TABLET | Freq: Three times a day (TID) | ORAL | 2 refills | Status: DC | PRN

## 2016-08-24 NOTE — Telephone Encounter (Signed)
Ativan refilled Please call patient to pick up

## 2016-08-25 NOTE — Telephone Encounter (Signed)
Script faxed over to [facility] on 08/25/16

## 2016-08-31 ENCOUNTER — Ambulatory Visit
Admission: RE | Admit: 2016-08-31 | Discharge: 2016-08-31 | Disposition: A | Discharge status: Home / Self Care | Payer: Medicaid Other | Source: Ambulatory Visit | Attending: Family Medicine | Admitting: Family Medicine

## 2016-08-31 DIAGNOSIS — Z1231 Encounter for screening mammogram for malignant neoplasm of breast: Secondary | ICD-10-CM

## 2016-09-22 ENCOUNTER — Encounter: Payer: Self-pay | Admitting: Family Medicine

## 2016-09-28 ENCOUNTER — Telehealth: Payer: Self-pay | Admitting: *Deleted

## 2016-09-28 MED ORDER — SERTRALINE HCL 50 MG PO TABS
150.0000 mg | ORAL_TABLET | Freq: Every day | ORAL | 4 refills | Status: DC
Start: 2016-09-28 — End: 2016-10-25

## 2016-10-05 NOTE — Telephone Encounter (Signed)
Note created in error.

## 2016-10-12 ENCOUNTER — Encounter: Payer: Self-pay | Admitting: Diagnostic Neuroimaging

## 2016-10-12 ENCOUNTER — Ambulatory Visit (INDEPENDENT_AMBULATORY_CARE_PROVIDER_SITE_OTHER): Payer: Medicaid Other | Admitting: Diagnostic Neuroimaging

## 2016-10-12 VITALS — BP 107/73 | HR 85 | Ht 59.0 in | Wt 106.0 lb

## 2016-10-12 DIAGNOSIS — B20 Human immunodeficiency virus [HIV] disease: Secondary | ICD-10-CM

## 2016-10-12 DIAGNOSIS — G40109 Localization-related (focal) (partial) symptomatic epilepsy and epileptic syndromes with simple partial seizures, not intractable, without status epilepticus: Secondary | ICD-10-CM

## 2016-10-12 DIAGNOSIS — F0280 Dementia in other diseases classified elsewhere without behavioral disturbance: Secondary | ICD-10-CM

## 2016-10-12 MED ORDER — LEVETIRACETAM 1000 MG PO TABS: 1000.0000 mg | ORAL_TABLET | Freq: Two times a day (BID) | ORAL | 4 refills | Status: AC

## 2016-10-12 NOTE — Progress Notes (Signed)
[facility]  PATIENT: [name] [name] DOB: 07/24/1955  REFERRING CLINICIAN:  HISTORY FROM: patient and home health aid REASON FOR VISIT: follow up visit   HISTORICAL  CHIEF COMPLAINT:  Chief Complaint  Patient presents with  . Follow-up     1 year    HISTORY OF PRESENT ILLNESS:   UPDATE 10/12/16: Since last visit, overall stable. Having some intermittent spells of staring, jittery state, especially with emotional triggers, 3-4 x per month. No generalized convulsions. Tolerating meds.   UPDATE 10/13/15: Since last visit, doing better. No definite seizures. Memory loss and confusion continue. Also anxiety is improved.   UPDATE 03/26/15: Patient has continued with memory loss, confusion, staring spells. Multiple ER visits. CNA comes to help pt and mother 8 hrs per day, 7 days per week. Staring spells worse with stress and tension.   UPDATE 09/05/14: Per caregiver, only 1 seizure (staring spell) in Nov 2015, but pt didn't go to hospital. Had 1 event of hallucinations in setting of UTI. Pt still living at home, with 11yo mother upstairs. [facility] aids come to help.   UPDATE 08/30/13: Since last visit, she may have had 2 seizures last month, but patient doesn't remember. This is reported second hand. Patient's 103 year old mother with dementia, thinks she heard a sound last month from the upstairs apartment and thinks patient may have had a seizure. However this was not corroborated by first hand account or by any other witnesses. Patient's home health aide has never witnessed any seizures.  UPDATE 06/13/12 (CM): patient returns for folowup with her caregiver. Last seizure over a year ago. Patient claims she stopped using crack cocaine in December 2013. Patient does not drive. She needs refills on her Keppra. She claims she has not missed doses of her meds.   PRIOR HPI (Dr. Mendel Corning): Ms. Stegner, 61 yr old returns for follow-up. She was last seen 06/14/11. She  was evaluated by Dr. Doy Mince 10/01/09 for "Seizures" which caregiver has seen increase in frequency over 2 years- occur at least 2/wk- starts w/ becoming less responsive, R hand clenches, grits teeth, will fall if standing- altered for about 30min, then normal- unaware of event- no assoc w/ activity or time of day- no shaking all over- events stereotypical- has been on Dilantin in past, placed on Keppra by Dr. Doy Mince. No further seizure activity.  Care giver who stays 3 hrs M-Friday and gives her meds and makes sure she has food, etc.   H/o CHI in past- no known FH sz- active cocaine use-   REVIEW OF SYSTEMS: Full 14 system review of systems performed and negative except: loss of vision eye itching light sens confusion depression anxiety.    ALLERGIES: No Known Allergies  HOME MEDICATIONS: Outpatient Medications Prior to Visit  Medication Sig Dispense Refill  . elvitegravir-cobicistat-emtricitabine-tenofovir (GENVOYA) 150-150-200-10 MG TABS tablet Take 1 tablet by mouth daily. 30 tablet 11  . feeding supplement, ENSURE ENLIVE, (ENSURE ENLIVE) LIQD Take 237 mLs by mouth 2 (two) times daily between meals. ICD 10 code: E43, R63.6 14220 mL 11  . ketoconazole (NIZORAL) 2 % shampoo Apply 1 application topically 2 (two) times a week. 120 mL 0  . latanoprost (XALATAN) 0.005 % ophthalmic solution 1 drop in both eyes daily  3  . levETIRAcetam (KEPPRA) 1000 MG tablet Take 1 tablet (1,000 mg total) by mouth 2 (two) times daily. 180 tablet 4  . LORazepam (ATIVAN) 1 MG tablet Take 1 tablet (1 mg total) by mouth  every 8 (eight) hours as needed (agitation). 60 tablet 2  . Multiple Vitamin (MULTIVITAMIN) tablet Take 1 tablet by mouth daily.      . sertraline (ZOLOFT) 50 MG tablet Take 3 tablets (150 mg total) by mouth daily. 90 tablet 4  . LORazepam (ATIVAN) 1 MG tablet Take 1 tablet (1 mg total) by mouth every 8 (eight) hours as needed (agitation). 60 tablet 2   No facility-administered medications prior to  visit.     PAST MEDICAL HISTORY: Past Medical History:  Diagnosis Date  . Dementia   . Depression   . H/O head injury   . HIV infection ([place])   . Seizures ([place])   . Substance abuse     PAST SURGICAL HISTORY: Past Surgical History:  Procedure Laterality Date  . BREAST BIOPSY    . unremarkable      FAMILY HISTORY: Family History  Problem Relation Age of Onset  . Diabetes Mother   . Behavior problems Mother   . Headache Mother   . Colon cancer Neg Hx   . Breast cancer Neg Hx     SOCIAL HISTORY:  Social History   Social History  . Marital status: Single    Spouse name: N/A  . Number of children: 1  . Years of education: 46   Occupational History  .  Unemployed   Social History Main Topics  . Smoking status: Former Research scientist (life sciences)  . Smokeless tobacco: Never Used  . Alcohol use No  . Drug use: Yes    Frequency: 7.0 times per week     Comment: crack patient states she does (sometimes), 03/26/15 denies  . Sexual activity: Yes    Partners: Male     Comment: pt. given condoms   Other Topics Concern  . Not on file   Social History Narrative   Patient lives at home with her mother.   Caffeine Use: 0     PHYSICAL EXAM  Vitals:   10/12/16 1033  BP: 107/73  Pulse: 85  Weight: 106 lb (48.1 kg)  Height: 4\' 11"  (1.499 m)    Not recorded      Body mass index is 21.41 kg/m.  MMSE - Mini Mental State Exam 10/13/2015 03/26/2015  Orientation to time 4 0  Orientation to Place 5 3  Registration 2 3  Attention/ Calculation 2 5  Recall 0 0  Language- name 2 objects 2 2  Language- repeat 0 0  Language- follow 3 step command 3 2  Language- follow 3 step command-comments - folded paper twice  Language- read & follow direction 1 1  Write a sentence 1 0  Copy design 0 0  Total score 20 16    GENERAL EXAM: Patient is in no distress; well developed, nourished and groomed; neck is supple; FRAIL APPEARING.  CARDIOVASCULAR: Regular rate and rhythm, no murmurs, no  carotid bruits  NEUROLOGIC: MENTAL STATUS: awake; MINIMAL SPEECH; FOLLOWS SIMPLE COMMANDS CRANIAL NERVE: pupils equal and reactive to light, visual fields full to confrontation, extraocular muscles intact, ENDGAZE NYSTAGMUS, facial sensation and strength symmetric, hearing intact, palate elevates symmetrically, uvula midline, shoulder shrug symmetric, tongue midline. MOTOR: normal bulk and tone, full strength in the BUE, BLE SENSORY: normal and symmetric to light touch COORDINATION: finger-nose-finger normal REFLEXES: deep tendon reflexes present and symmetric GAIT/STATION: narrow based gait     DIAGNOSTIC DATA (LABS, IMAGING, TESTING) - I reviewed patient records, labs, notes, testing and imaging myself where available.  Lab Results  Component Value Date  WBC 4.3 02/24/2016   HGB 12.1 02/24/2016   HCT 35.5 02/24/2016   MCV 80.0 02/24/2016   PLT 146 02/24/2016      Component Value Date/Time   NA 139 02/24/2016 1420   K 4.1 02/24/2016 1420   CL 102 02/24/2016 1420   CO2 27 02/24/2016 1420   GLUCOSE 129 (H) 02/24/2016 1420   BUN 12 02/24/2016 1420   CREATININE 0.94 02/24/2016 1420   CALCIUM 9.1 02/24/2016 1420   PROT 8.4 (H) 02/24/2016 1420   ALBUMIN 4.0 02/24/2016 1420   AST 94 (H) 02/24/2016 1420   ALT 67 (H) 02/24/2016 1420   ALKPHOS 129 02/24/2016 1420   BILITOT 0.4 02/24/2016 1420   GFRNONAA 67 02/24/2016 1420   GFRAA 77 02/24/2016 1420   Lab Results  Component Value Date   CHOL 208 (H) 10/14/2014   HDL 46 10/14/2014   LDLCALC 112 (H) 10/14/2014   TRIG 252 (H) 10/14/2014   CHOLHDL 4.5 10/14/2014   Lab Results  Component Value Date   HGBA1C 5.8 (H) 11/09/2014   No results found for: VITAMINB12 Lab Results  Component Value Date   TSH 1.726 11/09/2014    10/08/09 EEG - intermittent slowing of the left fronto-temporal area; no epileptiform discharges  10/16/09 MRI brain - mild generalized atrophy which is age inappropriate. No structural lesions are  noted.  02/16/15 CT head  1. No evidence of acute intracranial abnormality. 2. Moderate cerebellar and mild cerebral atrophy.   ASSESSMENT AND PLAN  61 y.o. year old female here with dementia (? HIV assoc) + complex partial seizures.   Dx: complex partial seizures + dementia (? 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 Greater than 50% of time was spent in counseling and coordination of care with patient. In summary we discussed:   SEIZURE DISORDER (established problem, stable) - continue levetiracetam 1000mg  twice a day  COGNITIVE IMPAIRMENT (HIV DEMENTIA, history of substance abuse, history of TBI) - (established problem, stable) - overall stable - continue supportive care - consider palliative care consult  Meds ordered this encounter  Medications  . levETIRAcetam (KEPPRA) 1000 MG tablet    Sig: Take 1 tablet (1,000 mg total) by mouth 2 (two) times daily.    Dispense:  180 tablet    Refill:  4   Return in about 1 year (around 10/12/2017).    Penni Bombard, MD 06/15/35, 04:88 AM Certified in Neurology, Neurophysiology and Neuroimaging  [facility] [address], [place], [place] [address] [phone]

## 2016-10-25 ENCOUNTER — Telehealth: Payer: Self-pay | Admitting: Family Medicine

## 2016-10-25 MED ORDER — SERTRALINE HCL 100 MG PO TABS: 150.0000 mg | ORAL_TABLET | Freq: Every day | ORAL | 5 refills | Status: AC

## 2016-10-25 NOTE — Telephone Encounter (Signed)
Called to patient Spoke with her aide [name]. She is doing well and her mood is stable on her current dose and she has been on the same dose since at least 2016.  She has HIV and dementia. A change has high risk of adverse effects  Plan:  Continue zoloft 150 mg daily

## 2016-10-25 NOTE — Telephone Encounter (Signed)
Caller calling to verify prescription dosage for Zoloft due to dosing regulations required by Medicaid. Please f/u with pharmacy.

## 2016-10-26 NOTE — Telephone Encounter (Signed)
PA completed. [place] to let them know to fill, unable to get through so left a message and that they could call me back with any issues. Otherwise, I expect the sertraline to be filled today.

## 2016-12-16 ENCOUNTER — Other Ambulatory Visit: Payer: Self-pay | Admitting: *Deleted

## 2016-12-16 DIAGNOSIS — B20 Human immunodeficiency virus [HIV] disease: Secondary | ICD-10-CM

## 2016-12-16 MED ORDER — ELVITEG-COBIC-EMTRICIT-TENOFAF 150-150-200-10 MG PO TABS: 1.0000 | ORAL_TABLET | Freq: Every day | ORAL | 1 refills | Status: DC

## 2016-12-17 ENCOUNTER — Other Ambulatory Visit: Payer: Self-pay | Admitting: *Deleted

## 2016-12-17 DIAGNOSIS — R636 Underweight: Secondary | ICD-10-CM

## 2016-12-17 DIAGNOSIS — E43 Unspecified severe protein-calorie malnutrition: Secondary | ICD-10-CM

## 2016-12-17 MED ORDER — ENSURE ENLIVE PO LIQD: 237.0000 mL | Freq: Two times a day (BID) | ORAL | 5 refills | Status: AC

## 2017-02-03 ENCOUNTER — Other Ambulatory Visit: Payer: Self-pay | Admitting: Family Medicine

## 2017-02-24 ENCOUNTER — Telehealth: Payer: Self-pay | Admitting: *Deleted

## 2017-02-24 ENCOUNTER — Other Ambulatory Visit: Payer: Self-pay | Admitting: *Deleted

## 2017-02-24 DIAGNOSIS — B20 Human immunodeficiency virus [HIV] disease: Secondary | ICD-10-CM

## 2017-02-24 MED ORDER — ELVITEG-COBIC-EMTRICIT-TENOFAF 150-150-200-10 MG PO TABS: 1.0000 | ORAL_TABLET | Freq: Every day | ORAL | 0 refills | Status: AC

## 2017-02-24 NOTE — Telephone Encounter (Signed)
Call from patient's personal care aid requesting refill on Genvoya. Advised could refill for one month, but patient needs appointment (she has not been here in a year). Scheduled for 03/01/17. She asked for the number for transportation stating her car is not running. Number given for [place] to set up [facility] transportation. Refill sent to [facility].

## 2017-03-01 ENCOUNTER — Ambulatory Visit: Payer: Self-pay | Admitting: Internal Medicine

## 2017-03-02 ENCOUNTER — Telehealth: Payer: Self-pay | Admitting: Diagnostic Neuroimaging

## 2017-03-02 NOTE — Telephone Encounter (Signed)
Pt's daughter called for the past couple of days she has been forgetting things, moving slower, around the eyes are darker, having loose bowels and it is darker, has wet the bed which is not something she does. She does have appt with new PCP but that is 11/28. Daughter is wanting to know if she can be seen. Please call to discuss. Angela/CNA was made aware if she feels this is an emergency to take the pt to the ED, otherwise RN will call tomorrow (10/25)

## 2017-03-03 ENCOUNTER — Ambulatory Visit (HOSPITAL_COMMUNITY)
Admission: EM | Admit: 2017-03-03 | Discharge: 2017-03-03 | Discharge status: Absent without leave | Payer: Medicaid Other

## 2017-03-03 ENCOUNTER — Emergency Department (HOSPITAL_COMMUNITY): Payer: Medicaid Other

## 2017-03-03 ENCOUNTER — Encounter (HOSPITAL_COMMUNITY): Payer: Self-pay

## 2017-03-03 ENCOUNTER — Inpatient Hospital Stay (HOSPITAL_COMMUNITY)
Admission: EM | Admit: 2017-03-03 | Discharge: 2017-03-14 | DRG: 436 | Disposition: A | Discharge status: Skilled Nursing Facility | Payer: Medicaid Other | Attending: Family Medicine | Admitting: Family Medicine

## 2017-03-03 DIAGNOSIS — F329 Major depressive disorder, single episode, unspecified: Secondary | ICD-10-CM | POA: Diagnosis present

## 2017-03-03 DIAGNOSIS — D649 Anemia, unspecified: Secondary | ICD-10-CM | POA: Diagnosis present

## 2017-03-03 DIAGNOSIS — C787 Secondary malignant neoplasm of liver and intrahepatic bile duct: Secondary | ICD-10-CM

## 2017-03-03 DIAGNOSIS — Z515 Encounter for palliative care: Secondary | ICD-10-CM

## 2017-03-03 DIAGNOSIS — D638 Anemia in other chronic diseases classified elsewhere: Secondary | ICD-10-CM | POA: Diagnosis present

## 2017-03-03 DIAGNOSIS — G40909 Epilepsy, unspecified, not intractable, without status epilepticus: Secondary | ICD-10-CM | POA: Diagnosis present

## 2017-03-03 DIAGNOSIS — Z79899 Other long term (current) drug therapy: Secondary | ICD-10-CM

## 2017-03-03 DIAGNOSIS — F0390 Unspecified dementia without behavioral disturbance: Secondary | ICD-10-CM | POA: Diagnosis present

## 2017-03-03 DIAGNOSIS — Z87891 Personal history of nicotine dependence: Secondary | ICD-10-CM

## 2017-03-03 DIAGNOSIS — R569 Unspecified convulsions: Secondary | ICD-10-CM

## 2017-03-03 DIAGNOSIS — B20 Human immunodeficiency virus [HIV] disease: Secondary | ICD-10-CM | POA: Diagnosis present

## 2017-03-03 DIAGNOSIS — F1910 Other psychoactive substance abuse, uncomplicated: Secondary | ICD-10-CM | POA: Diagnosis present

## 2017-03-03 DIAGNOSIS — Z820 Family history of epilepsy and other diseases of the nervous system: Secondary | ICD-10-CM

## 2017-03-03 DIAGNOSIS — Z66 Do not resuscitate: Secondary | ICD-10-CM | POA: Diagnosis present

## 2017-03-03 DIAGNOSIS — C799 Secondary malignant neoplasm of unspecified site: Secondary | ICD-10-CM | POA: Diagnosis present

## 2017-03-03 DIAGNOSIS — D72819 Decreased white blood cell count, unspecified: Secondary | ICD-10-CM

## 2017-03-03 DIAGNOSIS — C801 Malignant (primary) neoplasm, unspecified: Secondary | ICD-10-CM | POA: Diagnosis present

## 2017-03-03 LAB — TYPE AND SCREEN
ABO/RH(D): B POS
ANTIBODY SCREEN: NEGATIVE

## 2017-03-03 LAB — CBC WITH DIFFERENTIAL/PLATELET
BASOS ABS: 0 10*3/uL (ref 0.0–0.1)
BASOS PCT: 1 %
EOS PCT: 2 %
Eosinophils Absolute: 0.1 10*3/uL (ref 0.0–0.7)
HEMATOCRIT: 25 % — AB (ref 36.0–46.0)
Hemoglobin: 8.1 g/dL — ABNORMAL LOW (ref 12.0–15.0)
Lymphocytes Relative: 23 %
Lymphs Abs: 0.9 10*3/uL (ref 0.7–4.0)
MCH: 24.8 pg — ABNORMAL LOW (ref 26.0–34.0)
MCHC: 32.4 g/dL (ref 30.0–36.0)
MCV: 76.7 fL — AB (ref 78.0–100.0)
MONO ABS: 0.3 10*3/uL (ref 0.1–1.0)
MONOS PCT: 8 %
NEUTROS ABS: 2.6 10*3/uL (ref 1.7–7.7)
Neutrophils Relative %: 66 %
PLATELETS: 156 10*3/uL (ref 150–400)
RBC: 3.26 MIL/uL — ABNORMAL LOW (ref 3.87–5.11)
RDW: 17.1 % — AB (ref 11.5–15.5)
WBC: 3.9 10*3/uL — ABNORMAL LOW (ref 4.0–10.5)

## 2017-03-03 LAB — COMPREHENSIVE METABOLIC PANEL
ALBUMIN: 3.3 g/dL — AB (ref 3.5–5.0)
ALK PHOS: 487 U/L — AB (ref 38–126)
ALT: 35 U/L (ref 14–54)
AST: 163 U/L — AB (ref 15–41)
Anion gap: 9 (ref 5–15)
BILIRUBIN TOTAL: 1 mg/dL (ref 0.3–1.2)
BUN: 12 mg/dL (ref 6–20)
CALCIUM: 8.9 mg/dL (ref 8.9–10.3)
CO2: 21 mmol/L — ABNORMAL LOW (ref 22–32)
Chloride: 107 mmol/L (ref 101–111)
Creatinine, Ser: 0.77 mg/dL (ref 0.44–1.00)
GFR calc Af Amer: >60 mL/min (ref >60)
GFR calc non Af Amer: >60 mL/min (ref >60)
GLUCOSE: 98 mg/dL (ref 65–99)
Potassium: 3.9 mmol/L (ref 3.5–5.1)
Sodium: 137 mmol/L (ref 135–145)
Total Protein: 8.9 g/dL — ABNORMAL HIGH (ref 6.5–8.1)

## 2017-03-03 LAB — PROTIME-INR
INR: 1.07
Prothrombin Time: 13.9 seconds (ref 11.4–15.2)

## 2017-03-03 LAB — POC OCCULT BLOOD, ED: Fecal Occult Bld: NEGATIVE

## 2017-03-03 LAB — I-STAT CG4 LACTIC ACID, ED: Lactic Acid, Venous: 1.16 mmol/L (ref 0.5–1.9)

## 2017-03-03 LAB — LIPASE, BLOOD: Lipase: 44 U/L (ref 11–51)

## 2017-03-03 LAB — ABO/RH: ABO/RH(D): B POS

## 2017-03-03 MED ORDER — IOPAMIDOL (ISOVUE-300) INJECTION 61%
INTRAVENOUS | Status: AC
  Administered 2017-03-03: 100 mL
  Filled 2017-03-03: qty 100

## 2017-03-03 NOTE — ED Notes (Signed)
Patient denies pain and is resting comfortably.  

## 2017-03-03 NOTE — ED Notes (Signed)
Patient transported to CT 

## 2017-03-03 NOTE — Care Management (Signed)
ED CM reviewed patient's  Record patient as active at the [facility] Dr. Wynetta Emery PCP, and at the [facility] followed by Dr Linus Salmons but patient had not been to their office in a year. Personal Care contacted the [facility] for medication refill, and  it was advised that an appointment be scheduled it was arranged for 10/23,  but patient did not show for the appointment. Patient instructed to follow up once medically cleared for discharge to follow up with [facility] clinic, patient verbalized understanding teach back done. No further ED CM needs identified

## 2017-03-03 NOTE — ED Notes (Signed)
Care giver states that she has to go home.  [name] [phone]. Pt states she doesn't want to stay. CT aware of IV access. Will continue to monitor.

## 2017-03-03 NOTE — ED Notes (Signed)
Patient transported to Ultrasound. Will try for access when she returns before going to CT.

## 2017-03-03 NOTE — ED Provider Notes (Signed)
[facility] EMERGENCY DEPARTMENT Provider Note   CSN: [id number] Arrival date & time: 03/03/17  1452     History   Chief Complaint Chief Complaint  Patient presents with  . abd. bloating/loose stools    HPI [name] [name] is a 61 y.o. female.  61yo F w/ PMH including HIV on HAART, dementia, seizures who p/w abdominal bloating and loose stools.  Patient has had several days of abdominal bloating and loose stools.  Caregiver states that her stools turned dark a few days ago and have intermittently been dark, although the most recent was normal in color.  Patient does not usually complain of any problems but caregiver thinks that she has been having abdominal pain.  She seems to have had a decreased appetite over the past few days at dinnertime.  No fevers, vomiting, or urinary symptoms.  No cough/cold symptoms.  Caregiver thinks she may be altered but she does have a history of dementia.  Caregiver notes that she has lost weight recently.  No Tylenol or alcohol use.  LEVEL 5 CAVEAT DUE TO DEMENTIA   The history is provided by the patient and a caregiver.    Past Medical History:  Diagnosis Date  . Dementia   . Depression   . H/O head injury   . HIV infection ([place])   . Seizures ([place])   . Substance abuse [facility])     Patient Active Problem List   Diagnosis Date Noted  . Tinea capitis 12/16/2015  . Screening examination for venereal disease 01/14/2015  . Substance abuse ([place]) 01/14/2015  . History of UTI 12/20/2014  . Protein-calorie malnutrition, severe ([place]) 11/11/2014  . Anemia of chronic disease   . Thrombocytopenia ([place])   . Loss of weight 10/14/2014  . Encounter for long-term (current) use of medications 02/05/2014  . Loss of consciousness ([place]) 09/05/2013  . Blurry vision, right eye 08/23/2013  . Underweight 08/23/2013  . Periodontal disease 09/01/2010  . TREMOR 04/25/2007  . AMENORRHEA 09/21/2006  . Human immunodeficiency virus (HIV) disease  ([place]) 03/11/2006  . SYPHILIS, EARLY, SYMPTOMATIC, SECONDARY NOS 03/11/2006  . DEPENDENCE, ALCOHOL NEC/NOS, UNSPECIFIED 03/11/2006  . Depressed 03/11/2006  . Seizures ([place]) 03/11/2006  . Dementia 03/11/2006  . HEPATITIS B, HX OF 03/11/2006    Past Surgical History:  Procedure Laterality Date  . BREAST BIOPSY    . unremarkable      OB History    No data available       Home Medications    Prior to Admission medications   Medication Sig Start Date End Date Taking? Authorizing Provider  elvitegravir-cobicistat-emtricitabine-tenofovir (GENVOYA) 150-150-200-10 MG TABS tablet Take 1 tablet by mouth daily. Patient taking differently: Take 1 tablet by mouth at bedtime.  02/24/17  Yes Comer, Okey Regal, MD  ketoconazole (NIZORAL) 2 % shampoo Apply 1 application topically 2 (two) times a week. 03/08/16  Yes Funches, Josalyn, MD  latanoprost (XALATAN) 0.005 % ophthalmic solution 1 drop in both eyes daily 05/05/15  Yes [provider]  levETIRAcetam (KEPPRA) 1000 MG tablet Take 1 tablet (1,000 mg total) by mouth 2 (two) times daily. 10/12/16  Yes Penumalli, Earlean Polka, MD  LORazepam (ATIVAN) 1 MG tablet Take 1 tablet (1 mg total) by mouth every 8 (eight) hours as needed (agitation). 08/24/16  Yes Funches, Josalyn, MD  Multiple Vitamin (MULTIVITAMIN) tablet Take 1 tablet by mouth daily.     Yes [provider]  sertraline (ZOLOFT) 100 MG tablet Take 1.5 tablets (150 mg  total) by mouth daily. 10/25/16  Yes Funches, Josalyn, MD  feeding supplement, ENSURE ENLIVE, (ENSURE ENLIVE) LIQD Take 237 mLs by mouth 2 (two) times daily between meals. ICD 10 code: E5, R63.6 12/17/16   Thayer Headings, MD    Family History Family History  Problem Relation Age of Onset  . Diabetes Mother   . Behavior problems Mother   . Headache Mother   . Colon cancer Neg Hx   . Breast cancer Neg Hx     Social History Social History  Substance Use Topics  . Smoking status: Former Research scientist (life sciences)  . Smokeless  tobacco: Never Used  . Alcohol use No     Allergies   Patient has no known allergies.   Review of Systems Review of Systems  Unable to perform ROS: Dementia     Physical Exam Updated Vital Signs BP 116/75   Pulse 91   Temp 98.3 F (36.8 C) (Oral)   Resp 10   SpO2 98%   Physical Exam  Constitutional: She appears well-developed. No distress.  Thin, frail woman awake and alert  HENT:  Head: Normocephalic and atraumatic.  dry mucous membranes  Eyes: Conjunctivae are normal.  Neck: Neck supple.  Cardiovascular: Normal rate, regular rhythm and normal heart sounds.   No murmur heard. Pulmonary/Chest: Effort normal and breath sounds normal.  Abdominal: Soft. Bowel sounds are normal. She exhibits distension. There is hepatomegaly (substantial). There is tenderness (RUQ). There is no guarding.  Musculoskeletal: She exhibits no edema.  Neurological: She is alert.  Oriented to person  Skin: Skin is warm and dry.  Psychiatric:  Quiet, flat affect  Nursing note and vitals reviewed.    ED Treatments / Results  Labs (all labs ordered are listed, but only abnormal results are displayed) Labs Reviewed  COMPREHENSIVE METABOLIC PANEL - Abnormal; Notable for the following:       Result Value   CO2 21 (*)    Total Protein 8.9 (*)    Albumin 3.3 (*)    AST 163 (*)    Alkaline Phosphatase 487 (*)    All other components within normal limits  CBC WITH DIFFERENTIAL/PLATELET - Abnormal; Notable for the following:    WBC 3.9 (*)    RBC 3.26 (*)    Hemoglobin 8.1 (*)    HCT 25.0 (*)    MCV 76.7 (*)    MCH 24.8 (*)    RDW 17.1 (*)    All other components within normal limits  LIPASE, BLOOD  PROTIME-INR  I-STAT CG4 LACTIC ACID, ED  POC OCCULT BLOOD, ED  I-STAT CG4 LACTIC ACID, ED  TYPE AND SCREEN  ABO/RH    EKG  EKG Interpretation None       Radiology Dg Chest 2 View  Result Date: 03/03/2017 CLINICAL DATA:  Loose stools. EXAM: CHEST  2 VIEW COMPARISON:   11/06/2015. FINDINGS: Mediastinum and hilar structures are normal. Mild bibasilar atelectasis/infiltrates No pleural effusion or pneumothorax . Mild gastric distention. Thoracic spine scoliosis. IMPRESSION: 1.  Low lung volumes with mild basilar atelectasis/infiltrates. 2.  Mild gastric distention . Electronically Signed   By: Marcello Moores  Register   On: 03/03/2017 15:51   Ct Chest W Contrast  Result Date: 03/03/2017 CLINICAL DATA:  61 year old female with abdominal bloating and loose stools. EXAM: CT CHEST, ABDOMEN, AND PELVIS WITH CONTRAST TECHNIQUE: Multidetector CT imaging of the chest, abdomen and pelvis was performed following the standard protocol during bolus administration of intravenous contrast. CONTRAST:  160m ISOVUE-300 IOPAMIDOL (ISOVUE-300)  INJECTION 61% COMPARISON:  Chest radiograph dated 03/03/2017, abdominal ultrasound dated 03/03/2017 FINDINGS: Evaluation is limited due to streak artifact caused by patient's arms. CT CHEST FINDINGS Cardiovascular: There is no cardiomegaly or pericardial effusion. Coronary vascular calcification involving the LAD noted. The thoracic aorta appears unremarkable. The origins of the great vessels of the aortic arch appear patent. The main pulmonary trunk and central pulmonary arteries are grossly unremarkable as visualized. Mediastinum/Nodes: No hilar or mediastinal adenopathy. No axillary adenopathy. There is thickened appearance of the esophagus which may be related to underdistention. Esophagitis or underlying esophageal neoplasm is not excluded. There is slight prominence of the distal esophagus above the diaphragm which may represent a small hiatal hernia. This can be further evaluated with endoscopy. Lungs/Pleura: The lungs are clear. There is no pleural effusion or pneumothorax. The central airways are patent. Musculoskeletal: No acute osseous pathology. CT ABDOMEN PELVIS FINDINGS There is no intra-abdominal free air.  No free fluid. Hepatobiliary: The liver is  enlarged. There are innumerable heterogeneous the enhancing lesions within the liver. The largest lesion or conglomerate of lesions involves the left lobe of the liver and measures approximately 8.2 x 7.9 x 11.8 cm. This lesions are consistent with malignancy and most likely represent metastatic disease. Multifocal the parasellar carcinoma or malignancy arising from the biliary tree are not excluded. Further characterization with MRI without and with contrast or CT or ultrasound-guided tissue sampling recommended. There is extension of the tumor into the left hepatic duct (series 3, image 60 and coronal image 33). There is dilatation of the intrahepatic biliary trees as well as common bile duct. The gallbladder is distended. There multiple stones in the neck of the gallbladder. There is no pericholecystic fluid or evidence of acute cholecystitis by CT. Pancreas: Evaluation of the pancreas however is limited due to mass effect and displacement of the pancreatic tissue by the left hepatic mass. There is no dilatation of the pancreatic duct or evidence of gland atrophy. There is somewhat inferior displacement of the body of the pancreas by mass extending from the left lobe of the liver. There is an area of somewhat loss of fat plane between the mass and the pancreas however evaluation is very limited. Spleen: The spleen is enlarged measuring approximately 15 cm in craniocaudal length likely related to increased portal hypertension. Adrenals/Urinary Tract: The adrenal glands, kidneys, and the urinary bladder appear unremarkable. Stomach/Bowel: The stomach is displaced and compressed by the liver mass. The mass in the left lobe of the liver extends into the gastrohepatic space and abuts the lesser curvature of the stomach. There is no bowel obstruction or active inflammation. The appendix is normal. Vascular/Lymphatic: Mild atherosclerotic calcification of the abdominal aorta. The origins of the celiac axis, SMA, IMA and  the renal arteries are patent. No portal venous gas noted. There is mild compression of the main portal vein at the liver mass. Mildly enlarged splenic vein. A mildly dilated vein extends from the perirectal region to the porta splenic confluence. No definite retroperitoneal adenopathy. Reproductive: The uterus is poorly visualized and not well evaluated. There is an area of ill-defined soft tissue density in the pelvis superior to the uterus anterior to the rectum (series 3, image 88. This area measures approximately 2.3 x 6.7 cm. There is infiltration of the anterior perisigmoid fat plane. This is concerning for infiltrating tumor. Other: None Musculoskeletal: No acute or significant osseous findings. IMPRESSION: 1. Enlarged liver with multiple heterogeneous the enhancing and necrotic masses most consistent with metastatic disease.  Further characterization with MRI or direct tissue sampling recommended. 2. Ill-defined soft tissue in the pelvis with infiltration of the fat planes anterior to the sigmoid colon to concerning for infiltrative tumor. 3. Evidence of portal hypertension and splenomegaly. 4. No metastatic disease in the chest. 5. Thickened appearance of the distal esophagus above the diaphragm which may represent underdistention versus a small hiatal hernia. A neoplastic process is not excluded. Electronically Signed   By: Anner Crete M.D.   On: 03/03/2017 23:43   US Abdomen Complete  Result Date: 03/03/2017 CLINICAL DATA:  Abdominal bloating and diarrhea for several days. EXAM: ABDOMEN ULTRASOUND COMPLETE COMPARISON:  None. FINDINGS: Gallbladder: Gallstones are noted the gallbladder. No wall thickening visualized. No sonographic Murphy sign noted by sonographer. Common bile duct: Diameter: Dilated measuring 17 mm. Liver: There are multiple heterogeneous masses throughout the liver, largest measures 7.6 cm. The liver is enlarged measuring 21.9 cm. Intrahepatic biliary ductal dilatation is  identified. IVC: No abnormality visualized. Pancreas: Poor visualization reported by sonographer. Spleen: Size and appearance within normal limits. Right Kidney: Length: 10.3 cm. There is diffuse increased echotexture of the right kidney. No mass or hydronephrosis visualized. Left Kidney: Length: 10.3 cm. Echogenicity within normal limits. No mass or hydronephrosis visualized. Abdominal aorta: No aneurysm visualized. Other findings: None. IMPRESSION: Enlarged liver with innumerable masses. Further evaluation with abdomen pelvic with contrast CT is recommended. Metastatic disease is not excluded. Intra and extrahepatic biliary ductal dilatation. Gallstones within the gallbladder. Electronically Signed   By: Abelardo Diesel M.D.   On: 03/03/2017 21:44   Ct Abdomen Pelvis W Contrast  Result Date: 03/03/2017 CLINICAL DATA:  61 year old female with abdominal bloating and loose stools. EXAM: CT CHEST, ABDOMEN, AND PELVIS WITH CONTRAST TECHNIQUE: Multidetector CT imaging of the chest, abdomen and pelvis was performed following the standard protocol during bolus administration of intravenous contrast. CONTRAST:  174m ISOVUE-300 IOPAMIDOL (ISOVUE-300) INJECTION 61% COMPARISON:  Chest radiograph dated 03/03/2017, abdominal ultrasound dated 03/03/2017 FINDINGS: Evaluation is limited due to streak artifact caused by patient's arms. CT CHEST FINDINGS Cardiovascular: There is no cardiomegaly or pericardial effusion. Coronary vascular calcification involving the LAD noted. The thoracic aorta appears unremarkable. The origins of the great vessels of the aortic arch appear patent. The main pulmonary trunk and central pulmonary arteries are grossly unremarkable as visualized. Mediastinum/Nodes: No hilar or mediastinal adenopathy. No axillary adenopathy. There is thickened appearance of the esophagus which may be related to underdistention. Esophagitis or underlying esophageal neoplasm is not excluded. There is slight prominence  of the distal esophagus above the diaphragm which may represent a small hiatal hernia. This can be further evaluated with endoscopy. Lungs/Pleura: The lungs are clear. There is no pleural effusion or pneumothorax. The central airways are patent. Musculoskeletal: No acute osseous pathology. CT ABDOMEN PELVIS FINDINGS There is no intra-abdominal free air.  No free fluid. Hepatobiliary: The liver is enlarged. There are innumerable heterogeneous the enhancing lesions within the liver. The largest lesion or conglomerate of lesions involves the left lobe of the liver and measures approximately 8.2 x 7.9 x 11.8 cm. This lesions are consistent with malignancy and most likely represent metastatic disease. Multifocal the parasellar carcinoma or malignancy arising from the biliary tree are not excluded. Further characterization with MRI without and with contrast or CT or ultrasound-guided tissue sampling recommended. There is extension of the tumor into the left hepatic duct (series 3, image 60 and coronal image 33). There is dilatation of the intrahepatic biliary trees as well as common bile  duct. The gallbladder is distended. There multiple stones in the neck of the gallbladder. There is no pericholecystic fluid or evidence of acute cholecystitis by CT. Pancreas: Evaluation of the pancreas however is limited due to mass effect and displacement of the pancreatic tissue by the left hepatic mass. There is no dilatation of the pancreatic duct or evidence of gland atrophy. There is somewhat inferior displacement of the body of the pancreas by mass extending from the left lobe of the liver. There is an area of somewhat loss of fat plane between the mass and the pancreas however evaluation is very limited. Spleen: The spleen is enlarged measuring approximately 15 cm in craniocaudal length likely related to increased portal hypertension. Adrenals/Urinary Tract: The adrenal glands, kidneys, and the urinary bladder appear  unremarkable. Stomach/Bowel: The stomach is displaced and compressed by the liver mass. The mass in the left lobe of the liver extends into the gastrohepatic space and abuts the lesser curvature of the stomach. There is no bowel obstruction or active inflammation. The appendix is normal. Vascular/Lymphatic: Mild atherosclerotic calcification of the abdominal aorta. The origins of the celiac axis, SMA, IMA and the renal arteries are patent. No portal venous gas noted. There is mild compression of the main portal vein at the liver mass. Mildly enlarged splenic vein. A mildly dilated vein extends from the perirectal region to the porta splenic confluence. No definite retroperitoneal adenopathy. Reproductive: The uterus is poorly visualized and not well evaluated. There is an area of ill-defined soft tissue density in the pelvis superior to the uterus anterior to the rectum (series 3, image 88. This area measures approximately 2.3 x 6.7 cm. There is infiltration of the anterior perisigmoid fat plane. This is concerning for infiltrating tumor. Other: None Musculoskeletal: No acute or significant osseous findings. IMPRESSION: 1. Enlarged liver with multiple heterogeneous the enhancing and necrotic masses most consistent with metastatic disease. Further characterization with MRI or direct tissue sampling recommended. 2. Ill-defined soft tissue in the pelvis with infiltration of the fat planes anterior to the sigmoid colon to concerning for infiltrative tumor. 3. Evidence of portal hypertension and splenomegaly. 4. No metastatic disease in the chest. 5. Thickened appearance of the distal esophagus above the diaphragm which may represent underdistention versus a small hiatal hernia. A neoplastic process is not excluded. Electronically Signed   By: Anner Crete M.D.   On: 03/03/2017 23:43    Procedures Procedures (including critical care time)  Medications Ordered in ED Medications  iopamidol (ISOVUE-300) 61 %  injection (100 mLs  Contrast Given 03/03/17 2254)     Initial Impression / Assessment and Plan / ED Course  I have reviewed the triage vital signs and the nursing notes.  Pertinent labs & imaging results that were available during my care of the patient were reviewed by me and considered in my medical decision making (see chart for details).     Pt w/ several days of abdominal distention and loose stools, occasional dark stools.  She was thin and chronically ill-appearing on exam but nontoxic with reassuring vital signs.  She denies any complaints of pain.  She had significant hepatomegaly on exam with abdominal distention but no peritonitis.  Labs notable for WBC 3.9, hemoglobin 8.1 which is down from her baseline of 12.  AST 163, alk phos 487, normal bilirubin.  Normal lactate and lipase.  Chest x-ray with basilar atelectasis and gastric distention.  Obtain CT for better evaluation, I am concerned about the possibility of malignancy.  CT  shows multiple masses within the liver consistent with metastatic disease as well as an area near sigmoid colon concerning for infiltrative tumor. Chest CT clear. I obtained some collateral information from [place], Tourist information centre manager, who researched the patient and discovered that she has not been appropriately receiving her HIV medications for the past year. Her lab work shows leukopenia here which makes me concerned about uncontrolled HIV. Given the fact that she's been losing weight, is anemic here with unknown recent baseline and reports of black stools at home, I feel she needs admission for further workup and treatment. Discussed admission with hospitalist, Dr. Shanon Brow, and pt admitted for further care. Final Clinical Impressions(s) / ED Diagnoses   Final diagnoses:  Liver metastases ([place])  Anemia, unspecified type  Dementia without behavioral disturbance, unspecified dementia type  Leukopenia, unspecified type    New Prescriptions New Prescriptions   No  medications on file     Little, Wenda [place], MD 03/04/17 [phone]

## 2017-03-03 NOTE — Telephone Encounter (Signed)
[name] Ivory, CNA and advised her that Dr Leta Baptist sees patient for epilepsy, and her symptoms are not related to that issue. [name] to call the new PCP and request she be seen much sooner. Advised her that patient may have UTI,  blood and or infection in her stool, dehydration. Therefore she'll most likely need labs. Advised [name] [name] any of those issues can cause patient to be slow, confused and have other symptoms [name] [name] described in earlier call . [name] [name] stated she would call PCP again. This RN recommended that if PCP cannot see her very soon, she needs to take her to Urgent Care to be evaluated. [name] [name] verbalized understanding, agreement.

## 2017-03-03 NOTE — ED Notes (Signed)
This RN attempted IV x 2 without success.  Will reach out to IV team 

## 2017-03-03 NOTE — ED Triage Notes (Addendum)
Patient here with aide who reports several days of loose stools, abdominal bloating and seems altered per caregiver. Patient alert on assessment, hx of dementia.. Abdomen firm on arrival. No fever, denies nausea, denies vomiting.

## 2017-03-04 DIAGNOSIS — D649 Anemia, unspecified: Secondary | ICD-10-CM | POA: Diagnosis not present

## 2017-03-04 DIAGNOSIS — C801 Malignant (primary) neoplasm, unspecified: Secondary | ICD-10-CM | POA: Diagnosis present

## 2017-03-04 DIAGNOSIS — R140 Abdominal distension (gaseous): Secondary | ICD-10-CM | POA: Diagnosis present

## 2017-03-04 DIAGNOSIS — F1910 Other psychoactive substance abuse, uncomplicated: Secondary | ICD-10-CM | POA: Diagnosis not present

## 2017-03-04 DIAGNOSIS — Z66 Do not resuscitate: Secondary | ICD-10-CM | POA: Diagnosis present

## 2017-03-04 DIAGNOSIS — C799 Secondary malignant neoplasm of unspecified site: Secondary | ICD-10-CM | POA: Diagnosis present

## 2017-03-04 DIAGNOSIS — R569 Unspecified convulsions: Secondary | ICD-10-CM | POA: Diagnosis not present

## 2017-03-04 DIAGNOSIS — Z7189 Other specified counseling: Secondary | ICD-10-CM | POA: Diagnosis not present

## 2017-03-04 DIAGNOSIS — Z87891 Personal history of nicotine dependence: Secondary | ICD-10-CM | POA: Diagnosis not present

## 2017-03-04 DIAGNOSIS — D638 Anemia in other chronic diseases classified elsewhere: Secondary | ICD-10-CM | POA: Diagnosis present

## 2017-03-04 DIAGNOSIS — C787 Secondary malignant neoplasm of liver and intrahepatic bile duct: Secondary | ICD-10-CM | POA: Insufficient documentation

## 2017-03-04 DIAGNOSIS — Z79899 Other long term (current) drug therapy: Secondary | ICD-10-CM | POA: Diagnosis not present

## 2017-03-04 DIAGNOSIS — D72819 Decreased white blood cell count, unspecified: Secondary | ICD-10-CM | POA: Diagnosis not present

## 2017-03-04 DIAGNOSIS — B20 Human immunodeficiency virus [HIV] disease: Secondary | ICD-10-CM | POA: Diagnosis not present

## 2017-03-04 DIAGNOSIS — Z820 Family history of epilepsy and other diseases of the nervous system: Secondary | ICD-10-CM | POA: Diagnosis not present

## 2017-03-04 DIAGNOSIS — F0390 Unspecified dementia without behavioral disturbance: Secondary | ICD-10-CM | POA: Diagnosis present

## 2017-03-04 DIAGNOSIS — G40909 Epilepsy, unspecified, not intractable, without status epilepticus: Secondary | ICD-10-CM | POA: Diagnosis present

## 2017-03-04 DIAGNOSIS — Z515 Encounter for palliative care: Secondary | ICD-10-CM | POA: Diagnosis not present

## 2017-03-04 DIAGNOSIS — F329 Major depressive disorder, single episode, unspecified: Secondary | ICD-10-CM | POA: Diagnosis present

## 2017-03-04 LAB — IRON AND TIBC
Iron: 28 ug/dL (ref 28–170)
SATURATION RATIOS: 9 % — AB (ref 10.4–31.8)
TIBC: 301 ug/dL (ref 250–450)
UIBC: 273 ug/dL

## 2017-03-04 LAB — VITAMIN B12: VITAMIN B 12: 1109 pg/mL — AB (ref 180–914)

## 2017-03-04 LAB — FOLATE: Folate: 34 ng/mL (ref >5.9)

## 2017-03-04 LAB — RETICULOCYTES
RBC.: 3.12 MIL/uL — AB (ref 3.87–5.11)
RETIC COUNT ABSOLUTE: 28.1 10*3/uL (ref 19.0–186.0)
RETIC CT PCT: 0.9 % (ref 0.4–3.1)

## 2017-03-04 LAB — FERRITIN: Ferritin: 79 ng/mL (ref 11–307)

## 2017-03-04 MED ORDER — SODIUM CHLORIDE 0.9% FLUSH
3.0000 mL | Freq: Two times a day (BID) | INTRAVENOUS | Status: DC
  Administered 2017-03-04 – 2017-03-05 (×3): 3 mL via INTRAVENOUS

## 2017-03-04 MED ORDER — LEVETIRACETAM 500 MG PO TABS
1000.0000 mg | ORAL_TABLET | Freq: Two times a day (BID) | ORAL | Status: DC
  Administered 2017-03-04 – 2017-03-14 (×19): 1000 mg via ORAL
  Filled 2017-03-04 (×20): qty 2

## 2017-03-04 MED ORDER — SODIUM CHLORIDE 0.9 % IV SOLN
250.0000 mL | INTRAVENOUS | Status: DC | PRN
  Administered 2017-03-05: 250 mL via INTRAVENOUS

## 2017-03-04 MED ORDER — ONDANSETRON HCL 4 MG/2ML IJ SOLN: 4.0000 mg | Freq: Four times a day (QID) | INTRAMUSCULAR | Status: DC | PRN

## 2017-03-04 MED ORDER — LORAZEPAM 1 MG PO TABS: 1.0000 mg | ORAL_TABLET | Freq: Three times a day (TID) | ORAL | Status: DC | PRN

## 2017-03-04 MED ORDER — SODIUM CHLORIDE 0.9% FLUSH
3.0000 mL | INTRAVENOUS | Status: DC | PRN
  Administered 2017-03-05: 3 mL via INTRAVENOUS
  Filled 2017-03-04: qty 3

## 2017-03-04 MED ORDER — ONDANSETRON HCL 4 MG PO TABS
4.0000 mg | ORAL_TABLET | Freq: Four times a day (QID) | ORAL | Status: DC | PRN
  Administered 2017-03-05: 4 mg via ORAL
  Filled 2017-03-04: qty 1

## 2017-03-04 NOTE — Progress Notes (Signed)
Spoke w Dr Wendee Beavers, requested consideration for palliative consult as none noted to be ordered at this time.

## 2017-03-04 NOTE — ED Notes (Signed)
Patient denies pain and is resting comfortably.  

## 2017-03-04 NOTE — H&P (Signed)
History and Physical    [name] [name] [name] [name] DOB: Apr 20, 1956 DOA: 03/03/2017  PCP: [name] Pier, MD  Patient coming from:  home  Chief Complaint:   Abdomen swelling  HPI: [name] [name] is a 61 y.o. female with medical history significant of HIV untreated for over a year, dementia, seizures brought in by a care giver for increasing abdominal girth.  Pt cannot provide any history due to her dementia.  Care giver has left her in the ED.  Pt denies any pain.  Pt found to be anemic with cancer all over in her liver with a pelvic mass.  Pt is heme neg.  Pt referred for admission for her anemia and new masses.   Review of Systems: unobtainable from pt due to dementia  Past Medical History:  Diagnosis Date  . Dementia   . Depression   . H/O head injury   . HIV infection ([place])   . Seizures ([place])   . Substance abuse [facility])     Past Surgical History:  Procedure Laterality Date  . BREAST BIOPSY    . unremarkable       reports that she has quit smoking. She has never used smokeless tobacco. She reports that she uses drugs about 7 times per week. She reports that she does not drink alcohol.  No Known Allergies  Family History  Problem Relation Age of Onset  . Diabetes Mother   . Behavior problems Mother   . Headache Mother   . Colon cancer Neg Hx   . Breast cancer Neg Hx     Prior to Admission medications   Medication Sig Start Date End Date Taking? Authorizing Provider  elvitegravir-cobicistat-emtricitabine-tenofovir (GENVOYA) 150-150-200-10 MG TABS tablet Take 1 tablet by mouth daily. Patient taking differently: Take 1 tablet by mouth at bedtime.  02/24/17  Yes Comer, Okey Regal, MD  ketoconazole (NIZORAL) 2 % shampoo Apply 1 application topically 2 (two) times a week. 03/08/16  Yes Funches, Josalyn, MD  latanoprost (XALATAN) 0.005 % ophthalmic solution 1 drop in both eyes daily 05/05/15  Yes [provider]  levETIRAcetam (KEPPRA) 1000 MG tablet Take 1  tablet (1,000 mg total) by mouth 2 (two) times daily. 10/12/16  Yes Penumalli, Earlean Polka, MD  LORazepam (ATIVAN) 1 MG tablet Take 1 tablet (1 mg total) by mouth every 8 (eight) hours as needed (agitation). 08/24/16  Yes Funches, Josalyn, MD  Multiple Vitamin (MULTIVITAMIN) tablet Take 1 tablet by mouth daily.     Yes [provider]  sertraline (ZOLOFT) 100 MG tablet Take 1.5 tablets (150 mg total) by mouth daily. 10/25/16  Yes Funches, Josalyn, MD  feeding supplement, ENSURE ENLIVE, (ENSURE ENLIVE) LIQD Take 237 mLs by mouth 2 (two) times daily between meals. ICD 10 code: E43, R63.6 12/17/16   Thayer Headings, MD    Physical Exam: Vitals:   03/03/17 2300 03/03/17 2315 03/03/17 2330 03/03/17 2345  BP:  123/79 114/75 116/75  Pulse: 88 93 89 91  Resp: 15 20 13 10   Temp:      TempSrc:      SpO2: 97% 96% 98% 98%    Constitutional: NAD, calm, comfortable Vitals:   03/03/17 2300 03/03/17 2315 03/03/17 2330 03/03/17 2345  BP:  123/79 114/75 116/75  Pulse: 88 93 89 91  Resp: 15 20 13 10   Temp:      TempSrc:      SpO2: 97% 96% 98% 98%   Eyes: PERRL, lids and conjunctivae normal ENMT: Mucous  membranes are moist. Posterior pharynx clear of any exudate or lesions.Normal dentition.  Neck: normal, supple, no masses, no thyromegaly Respiratory: clear to auscultation bilaterally, no wheezing, no crackles. Normal respiratory effort. No accessory muscle use.  Cardiovascular: Regular rate and rhythm, no murmurs / rubs / gallops. No extremity edema. 2+ pedal pulses. No carotid bruits.  Abdomen: no tenderness, no masses palpated.  Significant hepatomegaly. Bowel sounds positive. disteneded Musculoskeletal: no clubbing / cyanosis. No joint deformity upper and lower extremities. Good ROM, no contractures. Normal muscle tone.  Skin: no rashes, lesions, ulcers. No induration Neurologic: CN 2-12 grossly intact. Sensation intact, DTR normal. Strength 5/5 in all 4.  Psychiatric: normal mood.  Pleasantly  demented  Labs on Admission: I have personally reviewed following labs and imaging studies  CBC:  Recent Labs Lab 03/03/17 1517  WBC 3.9*  NEUTROABS 2.6  HGB 8.1*  HCT 25.0*  MCV 76.7*  PLT 831   Basic Metabolic Panel:  Recent Labs Lab 03/03/17 1517  NA 137  K 3.9  CL 107  CO2 21*  GLUCOSE 98  BUN 12  CREATININE 0.77  CALCIUM 8.9   GFR: CrCl cannot be calculated (Unknown ideal weight.). Liver Function Tests:  Recent Labs Lab 03/03/17 1517  AST 163*  ALT 35  ALKPHOS 487*  BILITOT 1.0  PROT 8.9*  ALBUMIN 3.3*    Recent Labs Lab 03/03/17 1517  LIPASE 44   No results for input(s): AMMONIA in the last 168 hours. Coagulation Profile:  Recent Labs Lab 03/03/17 2030  INR 1.07    Radiological Exams on Admission: Dg Chest 2 View  Result Date: 03/03/2017 CLINICAL DATA:  Loose stools. EXAM: CHEST  2 VIEW COMPARISON:  11/06/2015. FINDINGS: Mediastinum and hilar structures are normal. Mild bibasilar atelectasis/infiltrates No pleural effusion or pneumothorax . Mild gastric distention. Thoracic spine scoliosis. IMPRESSION: 1.  Low lung volumes with mild basilar atelectasis/infiltrates. 2.  Mild gastric distention . Electronically Signed   By: Marcello Moores  Register   On: 03/03/2017 15:51   Ct Chest W Contrast  Result Date: 03/03/2017 CLINICAL DATA:  61 year old female with abdominal bloating and loose stools. EXAM: CT CHEST, ABDOMEN, AND PELVIS WITH CONTRAST TECHNIQUE: Multidetector CT imaging of the chest, abdomen and pelvis was performed following the standard protocol during bolus administration of intravenous contrast. CONTRAST:  166mL ISOVUE-300 IOPAMIDOL (ISOVUE-300) INJECTION 61% COMPARISON:  Chest radiograph dated 03/03/2017, abdominal ultrasound dated 03/03/2017 FINDINGS: Evaluation is limited due to streak artifact caused by patient's arms. CT CHEST FINDINGS Cardiovascular: There is no cardiomegaly or pericardial effusion. Coronary vascular calcification  involving the LAD noted. The thoracic aorta appears unremarkable. The origins of the great vessels of the aortic arch appear patent. The main pulmonary trunk and central pulmonary arteries are grossly unremarkable as visualized. Mediastinum/Nodes: No hilar or mediastinal adenopathy. No axillary adenopathy. There is thickened appearance of the esophagus which may be related to underdistention. Esophagitis or underlying esophageal neoplasm is not excluded. There is slight prominence of the distal esophagus above the diaphragm which may represent a small hiatal hernia. This can be further evaluated with endoscopy. Lungs/Pleura: The lungs are clear. There is no pleural effusion or pneumothorax. The central airways are patent. Musculoskeletal: No acute osseous pathology. CT ABDOMEN PELVIS FINDINGS There is no intra-abdominal free air.  No free fluid. Hepatobiliary: The liver is enlarged. There are innumerable heterogeneous the enhancing lesions within the liver. The largest lesion or conglomerate of lesions involves the left lobe of the liver and measures approximately 8.2 x 7.9  x 11.8 cm. This lesions are consistent with malignancy and most likely represent metastatic disease. Multifocal the parasellar carcinoma or malignancy arising from the biliary tree are not excluded. Further characterization with MRI without and with contrast or CT or ultrasound-guided tissue sampling recommended. There is extension of the tumor into the left hepatic duct (series 3, image 60 and coronal image 33). There is dilatation of the intrahepatic biliary trees as well as common bile duct. The gallbladder is distended. There multiple stones in the neck of the gallbladder. There is no pericholecystic fluid or evidence of acute cholecystitis by CT. Pancreas: Evaluation of the pancreas however is limited due to mass effect and displacement of the pancreatic tissue by the left hepatic mass. There is no dilatation of the pancreatic duct or  evidence of gland atrophy. There is somewhat inferior displacement of the body of the pancreas by mass extending from the left lobe of the liver. There is an area of somewhat loss of fat plane between the mass and the pancreas however evaluation is very limited. Spleen: The spleen is enlarged measuring approximately 15 cm in craniocaudal length likely related to increased portal hypertension. Adrenals/Urinary Tract: The adrenal glands, kidneys, and the urinary bladder appear unremarkable. Stomach/Bowel: The stomach is displaced and compressed by the liver mass. The mass in the left lobe of the liver extends into the gastrohepatic space and abuts the lesser curvature of the stomach. There is no bowel obstruction or active inflammation. The appendix is normal. Vascular/Lymphatic: Mild atherosclerotic calcification of the abdominal aorta. The origins of the celiac axis, SMA, IMA and the renal arteries are patent. No portal venous gas noted. There is mild compression of the main portal vein at the liver mass. Mildly enlarged splenic vein. A mildly dilated vein extends from the perirectal region to the porta splenic confluence. No definite retroperitoneal adenopathy. Reproductive: The uterus is poorly visualized and not well evaluated. There is an area of ill-defined soft tissue density in the pelvis superior to the uterus anterior to the rectum (series 3, image 88. This area measures approximately 2.3 x 6.7 cm. There is infiltration of the anterior perisigmoid fat plane. This is concerning for infiltrating tumor. Other: None Musculoskeletal: No acute or significant osseous findings. IMPRESSION: 1. Enlarged liver with multiple heterogeneous the enhancing and necrotic masses most consistent with metastatic disease. Further characterization with MRI or direct tissue sampling recommended. 2. Ill-defined soft tissue in the pelvis with infiltration of the fat planes anterior to the sigmoid colon to concerning for infiltrative  tumor. 3. Evidence of portal hypertension and splenomegaly. 4. No metastatic disease in the chest. 5. Thickened appearance of the distal esophagus above the diaphragm which may represent underdistention versus a small hiatal hernia. A neoplastic process is not excluded. Electronically Signed   By: Anner Crete M.D.   On: 03/03/2017 23:43   US Abdomen Complete  Result Date: 03/03/2017 CLINICAL DATA:  Abdominal bloating and diarrhea for several days. EXAM: ABDOMEN ULTRASOUND COMPLETE COMPARISON:  None. FINDINGS: Gallbladder: Gallstones are noted the gallbladder. No wall thickening visualized. No sonographic Murphy sign noted by sonographer. Common bile duct: Diameter: Dilated measuring 17 mm. Liver: There are multiple heterogeneous masses throughout the liver, largest measures 7.6 cm. The liver is enlarged measuring 21.9 cm. Intrahepatic biliary ductal dilatation is identified. IVC: No abnormality visualized. Pancreas: Poor visualization reported by sonographer. Spleen: Size and appearance within normal limits. Right Kidney: Length: 10.3 cm. There is diffuse increased echotexture of the right kidney. No mass or hydronephrosis  visualized. Left Kidney: Length: 10.3 cm. Echogenicity within normal limits. No mass or hydronephrosis visualized. Abdominal aorta: No aneurysm visualized. Other findings: None. IMPRESSION: Enlarged liver with innumerable masses. Further evaluation with abdomen pelvic with contrast CT is recommended. Metastatic disease is not excluded. Intra and extrahepatic biliary ductal dilatation. Gallstones within the gallbladder. Electronically Signed   By: Abelardo Diesel M.D.   On: 03/03/2017 21:44   Ct Abdomen Pelvis W Contrast  Result Date: 03/03/2017 CLINICAL DATA:  61 year old female with abdominal bloating and loose stools. EXAM: CT CHEST, ABDOMEN, AND PELVIS WITH CONTRAST TECHNIQUE: Multidetector CT imaging of the chest, abdomen and pelvis was performed following the standard protocol  during bolus administration of intravenous contrast. CONTRAST:  129mL ISOVUE-300 IOPAMIDOL (ISOVUE-300) INJECTION 61% COMPARISON:  Chest radiograph dated 03/03/2017, abdominal ultrasound dated 03/03/2017 FINDINGS: Evaluation is limited due to streak artifact caused by patient's arms. CT CHEST FINDINGS Cardiovascular: There is no cardiomegaly or pericardial effusion. Coronary vascular calcification involving the LAD noted. The thoracic aorta appears unremarkable. The origins of the great vessels of the aortic arch appear patent. The main pulmonary trunk and central pulmonary arteries are grossly unremarkable as visualized. Mediastinum/Nodes: No hilar or mediastinal adenopathy. No axillary adenopathy. There is thickened appearance of the esophagus which may be related to underdistention. Esophagitis or underlying esophageal neoplasm is not excluded. There is slight prominence of the distal esophagus above the diaphragm which may represent a small hiatal hernia. This can be further evaluated with endoscopy. Lungs/Pleura: The lungs are clear. There is no pleural effusion or pneumothorax. The central airways are patent. Musculoskeletal: No acute osseous pathology. CT ABDOMEN PELVIS FINDINGS There is no intra-abdominal free air.  No free fluid. Hepatobiliary: The liver is enlarged. There are innumerable heterogeneous the enhancing lesions within the liver. The largest lesion or conglomerate of lesions involves the left lobe of the liver and measures approximately 8.2 x 7.9 x 11.8 cm. This lesions are consistent with malignancy and most likely represent metastatic disease. Multifocal the parasellar carcinoma or malignancy arising from the biliary tree are not excluded. Further characterization with MRI without and with contrast or CT or ultrasound-guided tissue sampling recommended. There is extension of the tumor into the left hepatic duct (series 3, image 60 and coronal image 33). There is dilatation of the intrahepatic  biliary trees as well as common bile duct. The gallbladder is distended. There multiple stones in the neck of the gallbladder. There is no pericholecystic fluid or evidence of acute cholecystitis by CT. Pancreas: Evaluation of the pancreas however is limited due to mass effect and displacement of the pancreatic tissue by the left hepatic mass. There is no dilatation of the pancreatic duct or evidence of gland atrophy. There is somewhat inferior displacement of the body of the pancreas by mass extending from the left lobe of the liver. There is an area of somewhat loss of fat plane between the mass and the pancreas however evaluation is very limited. Spleen: The spleen is enlarged measuring approximately 15 cm in craniocaudal length likely related to increased portal hypertension. Adrenals/Urinary Tract: The adrenal glands, kidneys, and the urinary bladder appear unremarkable. Stomach/Bowel: The stomach is displaced and compressed by the liver mass. The mass in the left lobe of the liver extends into the gastrohepatic space and abuts the lesser curvature of the stomach. There is no bowel obstruction or active inflammation. The appendix is normal. Vascular/Lymphatic: Mild atherosclerotic calcification of the abdominal aorta. The origins of the celiac axis, SMA, IMA and the renal  arteries are patent. No portal venous gas noted. There is mild compression of the main portal vein at the liver mass. Mildly enlarged splenic vein. A mildly dilated vein extends from the perirectal region to the porta splenic confluence. No definite retroperitoneal adenopathy. Reproductive: The uterus is poorly visualized and not well evaluated. There is an area of ill-defined soft tissue density in the pelvis superior to the uterus anterior to the rectum (series 3, image 88. This area measures approximately 2.3 x 6.7 cm. There is infiltration of the anterior perisigmoid fat plane. This is concerning for infiltrating tumor. Other: None  Musculoskeletal: No acute or significant osseous findings. IMPRESSION: 1. Enlarged liver with multiple heterogeneous the enhancing and necrotic masses most consistent with metastatic disease. Further characterization with MRI or direct tissue sampling recommended. 2. Ill-defined soft tissue in the pelvis with infiltration of the fat planes anterior to the sigmoid colon to concerning for infiltrative tumor. 3. Evidence of portal hypertension and splenomegaly. 4. No metastatic disease in the chest. 5. Thickened appearance of the distal esophagus above the diaphragm which may represent underdistention versus a small hiatal hernia. A neoplastic process is not excluded. Electronically Signed   By: Anner Crete M.D.   On: 03/03/2017 23:43    Assessment/Plan 61 yo female with anemia, new likely extensive metastatic disease of unclear primary  Principal Problem:   Metastatic cancer ([place])- will need to consult SW to figure out who NOK/HCPOA is to discuss plan.  Pt poor candidate for aggressive treatment.  Need to discuss with NOK in the am.  Active Problems:   Human immunodeficiency virus (HIV) disease ([place])- noted, not taking her meds for over a year   Seizures ([place])- cont keppra   Dementia- noted   Anemia of chronic disease- ck anemia panel.  Heme neg.  No overt bleeding.  Request by EDP to admit due to unclear baseline H/H.  VSS.  No bleeding in the ED.   Substance abuse ([place])- probably not active  Would discuss palliative care consult with NOK.  She reports a son possibly named Hilliard Clark, difficult to understand her.  There was a care giver here earlier who has gone home.  DVT prophylaxis:  scds Code Status:  full Family Communication:  none Disposition Plan:  Per day team Consults called:  none Admission status:   observation   Noretta Frier A MD [facility]  If 7PM-7AM, please contact night-coverage [email or web address] Password [email or web address]  03/04/2017, 12:40 AM

## 2017-03-04 NOTE — Plan of Care (Signed)
Shift report indicated that patient had hx of cancer which had spread.  Unfortunately, I had not had time to confirm or look through patient records, prior to [place] (caregiver) and mom calling to check on status of patient.  When I mentioned current cancer, it was clear that family was not aware of such diagnosis and perhaps, it was a new diagnosis.  While still on the phone, I confirmed via ED notes that they had found Liver cancer on admit.  Therefore, via phone, I apologized for the circumstances and the means at which it was being delivered - However, I informed her that the patient does have (what appears to be) a new diagnosis of liver cancer, per the ED notes.  - There was no prior cancer history in the patient's chart.   Family stated they would be coming to the hospital today and I indicated that we could text page the Dr. to request rounding at that time.

## 2017-03-04 NOTE — Plan of Care (Signed)
Family has arrived. Dr. Informed of situation and requested to round if possible.  Palliative and Case Management notified that family is in room (per family's request.)

## 2017-03-04 NOTE — Progress Notes (Signed)
Patient seen and evaluated. Patient has history of HIV untreated for over a year, dementia, seizures. No findings suggest metastatic Carcinoma. I have consulted palliative and I have discussed with caregiver at bedside.  Gen.: Patient in no acute distress, alert and awake Cardiovascular: No cyanosis Pulmonary: No increased work of breathing, no wheezes  Will plan on reassessing next am.  Velvet Bathe  At this point I discussed with caregiver that a decision whether aggressive therapy is wanted which would include biopsying tumor and other treatment modalities offered by oncologist which may not be suited for patient given her other medical problems. We also talked about comfort care and that is an option moving forward.

## 2017-03-05 DIAGNOSIS — Z515 Encounter for palliative care: Secondary | ICD-10-CM

## 2017-03-05 NOTE — Progress Notes (Signed)
PROGRESS NOTE    [name] [name]  GUR:[id number] DOB: 1956/03/26 DOA: 03/03/2017 PCP: Ladell Pier, MD   Brief Narrative:  61 y.o. female with medical history significant of HIV untreated for over a year, dementia, seizures brought in by a care giver for increasing abdominal girth.  Pt cannot provide any history due to her dementia   Assessment & Plan:   Principal Problem:   Metastatic cancer ([place]) - Palliative to meet with caregiver and family to decide whether or not further work up should be pursued vs comfort care measures.  - advance diet   Active Problems:   Human immunodeficiency virus (HIV) disease ([place]) - Pt has been off medications for 1 year per reports in chart.    Seizures ([place]) - stable continue Keppra    Dementia   Anemia   Substance abuse ([place])   DVT prophylaxis: SCD's Code Status: DNR Family Communication: none at bedside. Disposition Plan: pending improvement in condition   Consultants:   None   Procedures: none   Antimicrobials: none   Subjective: Pt has no new complaints  Objective: Vitals:   03/04/17 1340 03/04/17 2244 03/05/17 0611 03/05/17 1306  BP: 116/70 122/76 114/65 122/72  Pulse: 96 96 100 97  Resp: 16 18 17 16   Temp: 98.7 F (37.1 C) 98.8 F (37.1 C) 98.7 F (37.1 C) 98.4 F (36.9 C)  TempSrc: Oral Axillary Oral Oral  SpO2: 99% 98% 99% 99%  Weight:       No intake or output data in the 24 hours ending 03/05/17 1355 Filed Weights   03/04/17 0500  Weight: 44.5 kg (98 lb 1 oz)    Examination:  General exam: Appears calm and comfortable, in nad.  Respiratory system: Clear to auscultation. Respiratory effort normal. Cardiovascular system: S1 & S2 heard, RRR. No JVD, murmurs, rubs Gastrointestinal system: Abdomen is nondistended, soft and nontender. No organomegaly or masses felt. Normal bowel sounds heard. Central nervous system: Alert and awake, no facial asymmetry Extremities: Symmetric 5 x 5 power. Skin:  No rashes, lesions or ulcers, on limited exam. Psychiatry: unable to assess well due to dementia, judgement impaired.    Data Reviewed: I have personally reviewed following labs and imaging studies  CBC:  Recent Labs Lab 03/03/17 1517  WBC 3.9*  NEUTROABS 2.6  HGB 8.1*  HCT 25.0*  MCV 76.7*  PLT 283   Basic Metabolic Panel:  Recent Labs Lab 03/03/17 1517  NA 137  K 3.9  CL 107  CO2 21*  GLUCOSE 98  BUN 12  CREATININE 0.77  CALCIUM 8.9   GFR: Estimated Creatinine Clearance: 51.6 mL/min (by C-G formula based on SCr of 0.77 mg/dL). Liver Function Tests:  Recent Labs Lab 03/03/17 1517  AST 163*  ALT 35  ALKPHOS 487*  BILITOT 1.0  PROT 8.9*  ALBUMIN 3.3*    Recent Labs Lab 03/03/17 1517  LIPASE 44   No results for input(s): AMMONIA in the last 168 hours. Coagulation Profile:  Recent Labs Lab 03/03/17 2030  INR 1.07   Cardiac Enzymes: No results for input(s): CKTOTAL, CKMB, CKMBINDEX, TROPONINI in the last 168 hours. BNP (last 3 results) No results for input(s): PROBNP in the last 8760 hours. HbA1C: No results for input(s): HGBA1C in the last 72 hours. CBG: No results for input(s): GLUCAP in the last 168 hours. Lipid Profile: No results for input(s): CHOL, HDL, LDLCALC, TRIG, CHOLHDL, LDLDIRECT in the last 72 hours. Thyroid Function Tests: No results for input(s): TSH, T4TOTAL,  FREET4, T3FREE, THYROIDAB in the last 72 hours. Anemia Panel:  Recent Labs  03/04/17 0300  VITAMINB12 1,109*  FOLATE 34.0  FERRITIN 79  TIBC 301  IRON 28  RETICCTPCT 0.9   Sepsis Labs:  Recent Labs Lab 03/03/17 1533  LATICACIDVEN 1.16    No results found for this or any previous visit (from the past 240 hour(s)).       Radiology Studies: Dg Chest 2 View  Result Date: 03/03/2017 CLINICAL DATA:  Loose stools. EXAM: CHEST  2 VIEW COMPARISON:  11/06/2015. FINDINGS: Mediastinum and hilar structures are normal. Mild bibasilar atelectasis/infiltrates No  pleural effusion or pneumothorax . Mild gastric distention. Thoracic spine scoliosis. IMPRESSION: 1.  Low lung volumes with mild basilar atelectasis/infiltrates. 2.  Mild gastric distention . Electronically Signed   By: Marcello Moores  Register   On: 03/03/2017 15:51   Ct Chest W Contrast  Result Date: 03/03/2017 CLINICAL DATA:  61 year old female with abdominal bloating and loose stools. EXAM: CT CHEST, ABDOMEN, AND PELVIS WITH CONTRAST TECHNIQUE: Multidetector CT imaging of the chest, abdomen and pelvis was performed following the standard protocol during bolus administration of intravenous contrast. CONTRAST:  144mL ISOVUE-300 IOPAMIDOL (ISOVUE-300) INJECTION 61% COMPARISON:  Chest radiograph dated 03/03/2017, abdominal ultrasound dated 03/03/2017 FINDINGS: Evaluation is limited due to streak artifact caused by patient's arms. CT CHEST FINDINGS Cardiovascular: There is no cardiomegaly or pericardial effusion. Coronary vascular calcification involving the LAD noted. The thoracic aorta appears unremarkable. The origins of the great vessels of the aortic arch appear patent. The main pulmonary trunk and central pulmonary arteries are grossly unremarkable as visualized. Mediastinum/Nodes: No hilar or mediastinal adenopathy. No axillary adenopathy. There is thickened appearance of the esophagus which may be related to underdistention. Esophagitis or underlying esophageal neoplasm is not excluded. There is slight prominence of the distal esophagus above the diaphragm which may represent a small hiatal hernia. This can be further evaluated with endoscopy. Lungs/Pleura: The lungs are clear. There is no pleural effusion or pneumothorax. The central airways are patent. Musculoskeletal: No acute osseous pathology. CT ABDOMEN PELVIS FINDINGS There is no intra-abdominal free air.  No free fluid. Hepatobiliary: The liver is enlarged. There are innumerable heterogeneous the enhancing lesions within the liver. The largest lesion or  conglomerate of lesions involves the left lobe of the liver and measures approximately 8.2 x 7.9 x 11.8 cm. This lesions are consistent with malignancy and most likely represent metastatic disease. Multifocal the parasellar carcinoma or malignancy arising from the biliary tree are not excluded. Further characterization with MRI without and with contrast or CT or ultrasound-guided tissue sampling recommended. There is extension of the tumor into the left hepatic duct (series 3, image 60 and coronal image 33). There is dilatation of the intrahepatic biliary trees as well as common bile duct. The gallbladder is distended. There multiple stones in the neck of the gallbladder. There is no pericholecystic fluid or evidence of acute cholecystitis by CT. Pancreas: Evaluation of the pancreas however is limited due to mass effect and displacement of the pancreatic tissue by the left hepatic mass. There is no dilatation of the pancreatic duct or evidence of gland atrophy. There is somewhat inferior displacement of the body of the pancreas by mass extending from the left lobe of the liver. There is an area of somewhat loss of fat plane between the mass and the pancreas however evaluation is very limited. Spleen: The spleen is enlarged measuring approximately 15 cm in craniocaudal length likely related to increased portal hypertension. Adrenals/Urinary  Tract: The adrenal glands, kidneys, and the urinary bladder appear unremarkable. Stomach/Bowel: The stomach is displaced and compressed by the liver mass. The mass in the left lobe of the liver extends into the gastrohepatic space and abuts the lesser curvature of the stomach. There is no bowel obstruction or active inflammation. The appendix is normal. Vascular/Lymphatic: Mild atherosclerotic calcification of the abdominal aorta. The origins of the celiac axis, SMA, IMA and the renal arteries are patent. No portal venous gas noted. There is mild compression of the main portal  vein at the liver mass. Mildly enlarged splenic vein. A mildly dilated vein extends from the perirectal region to the porta splenic confluence. No definite retroperitoneal adenopathy. Reproductive: The uterus is poorly visualized and not well evaluated. There is an area of ill-defined soft tissue density in the pelvis superior to the uterus anterior to the rectum (series 3, image 88. This area measures approximately 2.3 x 6.7 cm. There is infiltration of the anterior perisigmoid fat plane. This is concerning for infiltrating tumor. Other: None Musculoskeletal: No acute or significant osseous findings. IMPRESSION: 1. Enlarged liver with multiple heterogeneous the enhancing and necrotic masses most consistent with metastatic disease. Further characterization with MRI or direct tissue sampling recommended. 2. Ill-defined soft tissue in the pelvis with infiltration of the fat planes anterior to the sigmoid colon to concerning for infiltrative tumor. 3. Evidence of portal hypertension and splenomegaly. 4. No metastatic disease in the chest. 5. Thickened appearance of the distal esophagus above the diaphragm which may represent underdistention versus a small hiatal hernia. A neoplastic process is not excluded. Electronically Signed   By: Anner Crete M.D.   On: 03/03/2017 23:43   US Abdomen Complete  Result Date: 03/03/2017 CLINICAL DATA:  Abdominal bloating and diarrhea for several days. EXAM: ABDOMEN ULTRASOUND COMPLETE COMPARISON:  None. FINDINGS: Gallbladder: Gallstones are noted the gallbladder. No wall thickening visualized. No sonographic Murphy sign noted by sonographer. Common bile duct: Diameter: Dilated measuring 17 mm. Liver: There are multiple heterogeneous masses throughout the liver, largest measures 7.6 cm. The liver is enlarged measuring 21.9 cm. Intrahepatic biliary ductal dilatation is identified. IVC: No abnormality visualized. Pancreas: Poor visualization reported by sonographer. Spleen: Size  and appearance within normal limits. Right Kidney: Length: 10.3 cm. There is diffuse increased echotexture of the right kidney. No mass or hydronephrosis visualized. Left Kidney: Length: 10.3 cm. Echogenicity within normal limits. No mass or hydronephrosis visualized. Abdominal aorta: No aneurysm visualized. Other findings: None. IMPRESSION: Enlarged liver with innumerable masses. Further evaluation with abdomen pelvic with contrast CT is recommended. Metastatic disease is not excluded. Intra and extrahepatic biliary ductal dilatation. Gallstones within the gallbladder. Electronically Signed   By: Abelardo Diesel M.D.   On: 03/03/2017 21:44   Ct Abdomen Pelvis W Contrast  Result Date: 03/03/2017 CLINICAL DATA:  61 year old female with abdominal bloating and loose stools. EXAM: CT CHEST, ABDOMEN, AND PELVIS WITH CONTRAST TECHNIQUE: Multidetector CT imaging of the chest, abdomen and pelvis was performed following the standard protocol during bolus administration of intravenous contrast. CONTRAST:  132mL ISOVUE-300 IOPAMIDOL (ISOVUE-300) INJECTION 61% COMPARISON:  Chest radiograph dated 03/03/2017, abdominal ultrasound dated 03/03/2017 FINDINGS: Evaluation is limited due to streak artifact caused by patient's arms. CT CHEST FINDINGS Cardiovascular: There is no cardiomegaly or pericardial effusion. Coronary vascular calcification involving the LAD noted. The thoracic aorta appears unremarkable. The origins of the great vessels of the aortic arch appear patent. The main pulmonary trunk and central pulmonary arteries are grossly unremarkable as visualized. Mediastinum/Nodes:  No hilar or mediastinal adenopathy. No axillary adenopathy. There is thickened appearance of the esophagus which may be related to underdistention. Esophagitis or underlying esophageal neoplasm is not excluded. There is slight prominence of the distal esophagus above the diaphragm which may represent a small hiatal hernia. This can be further  evaluated with endoscopy. Lungs/Pleura: The lungs are clear. There is no pleural effusion or pneumothorax. The central airways are patent. Musculoskeletal: No acute osseous pathology. CT ABDOMEN PELVIS FINDINGS There is no intra-abdominal free air.  No free fluid. Hepatobiliary: The liver is enlarged. There are innumerable heterogeneous the enhancing lesions within the liver. The largest lesion or conglomerate of lesions involves the left lobe of the liver and measures approximately 8.2 x 7.9 x 11.8 cm. This lesions are consistent with malignancy and most likely represent metastatic disease. Multifocal the parasellar carcinoma or malignancy arising from the biliary tree are not excluded. Further characterization with MRI without and with contrast or CT or ultrasound-guided tissue sampling recommended. There is extension of the tumor into the left hepatic duct (series 3, image 60 and coronal image 33). There is dilatation of the intrahepatic biliary trees as well as common bile duct. The gallbladder is distended. There multiple stones in the neck of the gallbladder. There is no pericholecystic fluid or evidence of acute cholecystitis by CT. Pancreas: Evaluation of the pancreas however is limited due to mass effect and displacement of the pancreatic tissue by the left hepatic mass. There is no dilatation of the pancreatic duct or evidence of gland atrophy. There is somewhat inferior displacement of the body of the pancreas by mass extending from the left lobe of the liver. There is an area of somewhat loss of fat plane between the mass and the pancreas however evaluation is very limited. Spleen: The spleen is enlarged measuring approximately 15 cm in craniocaudal length likely related to increased portal hypertension. Adrenals/Urinary Tract: The adrenal glands, kidneys, and the urinary bladder appear unremarkable. Stomach/Bowel: The stomach is displaced and compressed by the liver mass. The mass in the left lobe of  the liver extends into the gastrohepatic space and abuts the lesser curvature of the stomach. There is no bowel obstruction or active inflammation. The appendix is normal. Vascular/Lymphatic: Mild atherosclerotic calcification of the abdominal aorta. The origins of the celiac axis, SMA, IMA and the renal arteries are patent. No portal venous gas noted. There is mild compression of the main portal vein at the liver mass. Mildly enlarged splenic vein. A mildly dilated vein extends from the perirectal region to the porta splenic confluence. No definite retroperitoneal adenopathy. Reproductive: The uterus is poorly visualized and not well evaluated. There is an area of ill-defined soft tissue density in the pelvis superior to the uterus anterior to the rectum (series 3, image 88. This area measures approximately 2.3 x 6.7 cm. There is infiltration of the anterior perisigmoid fat plane. This is concerning for infiltrating tumor. Other: None Musculoskeletal: No acute or significant osseous findings. IMPRESSION: 1. Enlarged liver with multiple heterogeneous the enhancing and necrotic masses most consistent with metastatic disease. Further characterization with MRI or direct tissue sampling recommended. 2. Ill-defined soft tissue in the pelvis with infiltration of the fat planes anterior to the sigmoid colon to concerning for infiltrative tumor. 3. Evidence of portal hypertension and splenomegaly. 4. No metastatic disease in the chest. 5. Thickened appearance of the distal esophagus above the diaphragm which may represent underdistention versus a small hiatal hernia. A neoplastic process is not excluded. Electronically Signed  By: Anner Crete M.D.   On: 03/03/2017 23:43    Scheduled Meds: . levETIRAcetam  1,000 mg Oral BID  . sodium chloride flush  3 mL Intravenous Q12H   Continuous Infusions: . sodium chloride       LOS: 1 day    Time spent: > 35 minutes  Velvet Bathe, MD [facility] Pager  [phone]  If 7PM-7AM, please contact night-coverage [email or web address] Password [email or web address] 03/05/2017, 1:55 PM

## 2017-03-05 NOTE — Consult Note (Signed)
Consultation Note Date: 03/05/2017   Patient Name: [name] [name]  DOB: [birth year]  MRN: [id number]  Age / Sex: 61 y.o., female  PCP: Ladell Pier, MD Referring Physician: Velvet Bathe, MD  Reason for Consultation: Establishing goals of care, Hospice Evaluation and Psychosocial/spiritual support  HPI/Patient Profile: 61 y.o. female  with past medical history of HIV (untreated for over a year), dementia, seizures, history of a head injury, history of substance use disorder admitted on 03/03/2017 with increased abdominal swelling.  She was brought to the emergency room by her caregiver, Ambrose Finland.  Patient was found to be anemic.  CT scan of the abdomen was performed and multiple necrotic lesions, ill-defined pelvic mass, concerning for infiltrative tumor, no neoplastic process observed in the chest  Consult for goals of care.  Patient has not been seen by the palliative medicine team in the past.   Clinical Assessment and Goals of Care: Met with patient, reviewed chart as well as called her mother, Autumn Patty.  Mother shares that patient has lived with her "all her life".  Patient's mother is elderly, 7 years old, bedbound and ill herself she states.  Both patient as well as patient's mother are assisted by a caregiver, Ambrose Finland. Per Ms. Pettiford, patient has a long history of substance use disorder and now with dementia often times is nonverbal and does not recognize her or other family.  I did review with Mrs. Pettiford new findings of either primary liver cancer or metastatic disease; Mrs Pettifird asked "Is that bad?" Mrs. Renato Battles also shares that patient is unmarried but she did have a son, Hilliard Clark (I could not understand his last name)  from whom she is estranged.  Mrs. Pettiford does not have legal healthcare power of attorney.  Unfortunately 2/2 to dementia, pt is unable to speak for  herself or designate a HCPOA. Legally, her son Hilliard Clark would be her health care proxy. Mrs. Pettifiord shared these numbers for Sean : hm [phone]; work [phone]. I did attempt to reach hm but no answer at either number or means to leave a message    SUMMARY OF RECOMMENDATIONS   DNR Spoke to mother via phone to begin discussion regarding HCPOA, as well as new clinical findings of liver cancer in the setting of HIV, advanced dementia Recommended hospice and did tell her mother, with her permission, that pt is terminally ill Will place consult to SW to assist in locating son, Hilliard Clark, to either assist in decision making going forward or deferring to pt's mother and caregiver. Need to have discussion regarding: BX, oncology consult, paracentesis, or comfort care  Code Status/Advance Care Planning:  DNR    Symptom Management:   Pain: Pt denies pain. Cont with low dose tylenol PRN. Monitor for need to add opioid  Seizures: Cont keppra  Anxiety: Cont ativan PRN  Palliative Prophylaxis:   Aspiration, Bowel Regimen, Delirium Protocol, Eye Care, Frequent Pain Assessment, Oral Care and Turn Reposition   Prognosis:   < 6 months in the setting of  advanced dementia, HIV positive status, new findings of primary liver cancer or metastatic disease to the liver as well as pelvic mass  Discharge Planning: To Be Determined      Primary Diagnoses: Present on Admission: . Human immunodeficiency virus (HIV) disease ([place]) . Dementia . Anemia . Substance abuse ([place]) . Metastatic cancer ([place])   I have reviewed the medical record, interviewed the patient and family, and examined the patient. The following aspects are pertinent.  Past Medical History:  Diagnosis Date  . Dementia   . Depression   . H/O head injury   . HIV infection ([place])   . Seizures ([place])   . Substance abuse [facility])    Social History   Social History  . Marital status: Single    Spouse name: N/A  . Number of  children: 1  . Years of education: 44   Occupational History  .  Unemployed   Social History Main Topics  . Smoking status: Former Research scientist (life sciences)  . Smokeless tobacco: Never Used  . Alcohol use No  . Drug use: Yes    Frequency: 7.0 times per week     Comment: crack patient states she does (sometimes), 03/26/15 denies  . Sexual activity: Yes    Partners: Male     Comment: pt. given condoms   Other Topics Concern  . None   Social History Narrative   Patient lives at home with her mother.   Caffeine Use: 0   Family History  Problem Relation Age of Onset  . Diabetes Mother   . Behavior problems Mother   . Headache Mother   . Colon cancer Neg Hx   . Breast cancer Neg Hx    Scheduled Meds: . levETIRAcetam  1,000 mg Oral BID  . sodium chloride flush  3 mL Intravenous Q12H   Continuous Infusions: . sodium chloride     PRN Meds:.sodium chloride, LORazepam, ondansetron **OR** ondansetron (ZOFRAN) IV, sodium chloride flush Medications Prior to Admission:  Prior to Admission medications   Medication Sig Start Date End Date Taking? Authorizing Provider  elvitegravir-cobicistat-emtricitabine-tenofovir (GENVOYA) 150-150-200-10 MG TABS tablet Take 1 tablet by mouth daily. Patient taking differently: Take 1 tablet by mouth at bedtime.  02/24/17  Yes Comer, Okey Regal, MD  ketoconazole (NIZORAL) 2 % shampoo Apply 1 application topically 2 (two) times a week. 03/08/16  Yes Funches, Josalyn, MD  latanoprost (XALATAN) 0.005 % ophthalmic solution 1 drop in both eyes daily 05/05/15  Yes [provider]  levETIRAcetam (KEPPRA) 1000 MG tablet Take 1 tablet (1,000 mg total) by mouth 2 (two) times daily. 10/12/16  Yes Penumalli, Earlean Polka, MD  LORazepam (ATIVAN) 1 MG tablet Take 1 tablet (1 mg total) by mouth every 8 (eight) hours as needed (agitation). 08/24/16  Yes Funches, Josalyn, MD  Multiple Vitamin (MULTIVITAMIN) tablet Take 1 tablet by mouth daily.     Yes [provider]    sertraline (ZOLOFT) 100 MG tablet Take 1.5 tablets (150 mg total) by mouth daily. 10/25/16  Yes Funches, Josalyn, MD  feeding supplement, ENSURE ENLIVE, (ENSURE ENLIVE) LIQD Take 237 mLs by mouth 2 (two) times daily between meals. ICD 10 code: E12, R63.6 12/17/16   Comer, Okey Regal, MD   No Known Allergies Review of Systems  Unable to perform ROS: Dementia    Physical Exam  Constitutional:  Cachectic older female in no acute distress  HENT:  Head: Normocephalic and atraumatic.  Neck: Normal range of motion.  Cardiovascular: Normal rate.   Pulmonary/Chest:  Effort normal.  Abdominal: She exhibits distension.  Ascites  Musculoskeletal: Normal range of motion.  Neurological: She is alert.  Oriented only to herself  Skin: Skin is warm and dry.  Psychiatric:  Patient is confused, affect constricted otherwise unable to test  Nursing note and vitals reviewed.   Vital Signs: BP 122/72 (BP Location: Left Arm)   Pulse 97   Temp 98.4 F (36.9 C) (Oral)   Resp 16   Wt 44.5 kg (98 lb 1 oz)   SpO2 99%   BMI 19.81 kg/m  Pain Assessment: No/denies pain       SpO2: SpO2: 99 % O2 Device:SpO2: 99 % O2 Flow Rate: .   IO: Intake/output summary: No intake or output data in the 24 hours ending 03/05/17 1441  LBM: Last BM Date: 03/04/17 Baseline Weight: Weight: 44.5 kg (98 lb 1 oz) Most recent weight: Weight: 44.5 kg (98 lb 1 oz)     Palliative Assessment/Data:   Flowsheet Rows     Most Recent Value  Intake Tab  Referral Department  Hospitalist  Unit at Time of Referral  Med/Surg Unit  Palliative Care Primary Diagnosis  Cancer  Date Notified  03/04/17  Palliative Care Type  New Palliative care  Reason for referral  Clarify Goals of Care  Date of Admission  03/03/17  Date first seen by Palliative Care  03/05/17  # of days Palliative referral response time  1 Day(s)  # of days IP prior to Palliative referral  1  Clinical Assessment  Palliative Performance Scale Score  30%   Pain Max last 24 hours  Not able to report  Pain Min Last 24 hours  Not able to report  Dyspnea Max Last 24 Hours  Not able to report  Dyspnea Min Last 24 hours  Not able to report  Nausea Max Last 24 Hours  Not able to report  Nausea Min Last 24 Hours  Not able to report  Anxiety Max Last 24 Hours  Not able to report  Anxiety Min Last 24 Hours  Not able to report  Other Max Last 24 Hours  Not able to report  Psychosocial & Spiritual Assessment  Palliative Care Outcomes  Patient/Family meeting held?  Yes  Who was at the meeting?  spoke to mother via phone  Patient/Family wishes: Interventions discontinued/not started   Mechanical Ventilation  Palliative Care follow-up planned  Yes, Facility      Time In: 1300 Time Out: 1350 Time Total: 50 min Greater than 50%  of this time was spent counseling and coordinating care related to the above assessment and plan.  Signed by: Dory Horn, NP   Please contact Palliative Medicine Team phone at [phone] for questions and concerns.  For individual provider: See Shea Evans

## 2017-03-06 ENCOUNTER — Encounter (HOSPITAL_COMMUNITY): Payer: Self-pay | Admitting: *Deleted

## 2017-03-06 NOTE — Progress Notes (Signed)
Pt pulled out her IV this evening. Was unable to explain why she did this. She was trying to walk to the BR. IV not restarted since this is the second IV today. PO fluids encouraged.

## 2017-03-06 NOTE — Progress Notes (Signed)
Pt is requiring assistance when ambulating. She is very unstable. She is unable to clean herself after a BM. She had stool all over her hands and fingernails after cleaning herself.

## 2017-03-06 NOTE — Progress Notes (Signed)
PROGRESS NOTE    [name] [name]  VOZ:[id number] DOB: [birth year] DOA: 03/03/2017 PCP: Ladell Pier, MD   Brief Narrative:  61 y.o. female with medical history significant of HIV untreated for over a year, dementia, seizures brought in by a care giver for increasing abdominal girth.  Pt cannot provide any history due to her dementia   Assessment & Plan:   Principal Problem:   Metastatic cancer ([place]) - Palliative has evaluated and mother is aware that patient has terminal illness  Active Problems:   Human immunodeficiency virus (HIV) disease ([place]) - Pt has been off medications for 1 year per reports in chart.    Seizures ([place]) - stable continue Keppra    Dementia   Anemia   Substance abuse ([place])   DVT prophylaxis: SCD's Code Status: DNR Family Communication: none at bedside. Disposition Plan: currently pending discussion with palliative and family   Consultants:   None   Procedures: none   Antimicrobials: none   Subjective: Pt has no new complaints today.  Objective: Vitals:   03/05/17 1935 03/06/17 0500 03/06/17 0634 03/06/17 1447  BP: 120/70  113/67 115/64  Pulse: 100  90 98  Resp: 19  17 17   Temp: 98.1 F (36.7 C)  98 F (36.7 C) 98.9 F (37.2 C)  TempSrc: Oral  Oral Oral  SpO2: 100%  98% 99%  Weight:  43.5 kg (96 lb)      Intake/Output Summary (Last 24 hours) at 03/06/17 1500 Last data filed at 03/06/17 1332  Gross per 24 hour  Intake              780 ml  Output                0 ml  Net              780 ml   Filed Weights   03/04/17 0500 03/06/17 0500  Weight: 44.5 kg (98 lb 1 oz) 43.5 kg (96 lb)    Examination: Exam unchaged when compared to last 03/05/17  General exam: Appears calm and comfortable, in nad.  Respiratory system: Clear to auscultation. Respiratory effort normal. Cardiovascular system: S1 & S2 heard, RRR. No JVD, murmurs, rubs Gastrointestinal system: Abdomen is nondistended, soft and nontender. No organomegaly or  masses felt. Normal bowel sounds heard. Central nervous system: Alert and awake, no facial asymmetry Extremities: Symmetric 5 x 5 power. Skin: No rashes, lesions or ulcers, on limited exam. Psychiatry: unable to assess well due to dementia, judgement impaired.    Data Reviewed: I have personally reviewed following labs and imaging studies  CBC:  Recent Labs Lab 03/03/17 1517  WBC 3.9*  NEUTROABS 2.6  HGB 8.1*  HCT 25.0*  MCV 76.7*  PLT 425   Basic Metabolic Panel:  Recent Labs Lab 03/03/17 1517  NA 137  K 3.9  CL 107  CO2 21*  GLUCOSE 98  BUN 12  CREATININE 0.77  CALCIUM 8.9   GFR: Estimated Creatinine Clearance: 51.6 mL/min (by C-G formula based on SCr of 0.77 mg/dL). Liver Function Tests:  Recent Labs Lab 03/03/17 1517  AST 163*  ALT 35  ALKPHOS 487*  BILITOT 1.0  PROT 8.9*  ALBUMIN 3.3*    Recent Labs Lab 03/03/17 1517  LIPASE 44   No results for input(s): AMMONIA in the last 168 hours. Coagulation Profile:  Recent Labs Lab 03/03/17 2030  INR 1.07   Cardiac Enzymes: No results for input(s): CKTOTAL, CKMB, CKMBINDEX, TROPONINI in the  last 168 hours. BNP (last 3 results) No results for input(s): PROBNP in the last 8760 hours. HbA1C: No results for input(s): HGBA1C in the last 72 hours. CBG: No results for input(s): GLUCAP in the last 168 hours. Lipid Profile: No results for input(s): CHOL, HDL, LDLCALC, TRIG, CHOLHDL, LDLDIRECT in the last 72 hours. Thyroid Function Tests: No results for input(s): TSH, T4TOTAL, FREET4, T3FREE, THYROIDAB in the last 72 hours. Anemia Panel:  Recent Labs  03/04/17 0300  VITAMINB12 1,109*  FOLATE 34.0  FERRITIN 79  TIBC 301  IRON 28  RETICCTPCT 0.9   Sepsis Labs:  Recent Labs Lab 03/03/17 1533  LATICACIDVEN 1.16    No results found for this or any previous visit (from the past 240 hour(s)).    Radiology Studies: No results found.  Scheduled Meds: . levETIRAcetam  1,000 mg Oral BID    Continuous Infusions:    LOS: 2 days    Time spent: > 35 minutes  Velvet Bathe, MD [facility] Pager [phone]  If 7PM-7AM, please contact night-coverage [email or web address] Password [email or web address] 03/06/2017, 3:00 PM

## 2017-03-07 NOTE — Progress Notes (Signed)
PROGRESS NOTE    ANNISON BIRCHARD  UMP:[id number] DOB: 08/01/1955 DOA: 03/03/2017 PCP: Ladell Pier, MD   Brief Narrative:  61 y.o. female with medical history significant of HIV untreated for over a year, dementia, seizures brought in by a care giver for increasing abdominal girth.  Pt cannot provide any history due to her dementia  Assessment & Plan:   Principal Problem:   Metastatic cancer ([place]) - Palliative has evaluated and mother is aware that patient has terminal illness - disposition pending discussion with family as patient in unable to make her own medical decisions due to dementia. I do not believe patient is able to understand her illness and how her decisions will influence her outcome.  Active Problems:   Human immunodeficiency virus (HIV) disease ([place]) - Pt has been off medications for 1 year per reports in chart.    Seizures ([place]) - stable continue Keppra    Dementia   Anemia   Substance abuse ([place])   DVT prophylaxis: SCD's Code Status: DNR Family Communication: none at bedside. Disposition Plan: currently pending discussion with palliative and family   Consultants:   None   Procedures: none   Antimicrobials: none   Subjective: No complaints reported  Objective: Vitals:   03/06/17 1447 03/06/17 2145 03/07/17 0500 03/07/17 0820  BP: 115/64 116/70  113/65  Pulse: 98 95  89  Resp: 17     Temp: 98.9 F (37.2 C) 98.5 F (36.9 C)  98.2 F (36.8 C)  TempSrc: Oral Oral  Oral  SpO2: 99% 100%  99%  Weight:   43.5 kg (95 lb 14.4 oz)     Intake/Output Summary (Last 24 hours) at 03/07/17 1330 Last data filed at 03/07/17 0931  Gross per 24 hour  Intake              600 ml  Output                0 ml  Net              600 ml   Filed Weights   03/04/17 0500 03/06/17 0500 03/07/17 0500  Weight: 44.5 kg (98 lb 1 oz) 43.5 kg (96 lb) 43.5 kg (95 lb 14.4 oz)    Examination: Exam unchaged when compared to last 03/06/17  General exam:  Appears calm and comfortable, in nad.  Respiratory system: Clear to auscultation. Respiratory effort normal. Cardiovascular system: S1 & S2 heard, RRR. No JVD, murmurs, rubs Gastrointestinal system: Abdomen is nondistended, soft and nontender. No organomegaly or masses felt. Normal bowel sounds heard. Central nervous system: Alert and awake, no facial asymmetry Extremities: Symmetric 5 x 5 power. Skin: No rashes, lesions or ulcers, on limited exam. Psychiatry: unable to assess well due to dementia, judgement impaired.    Data Reviewed: I have personally reviewed following labs and imaging studies  CBC:  Recent Labs Lab 03/03/17 1517  WBC 3.9*  NEUTROABS 2.6  HGB 8.1*  HCT 25.0*  MCV 76.7*  PLT 400   Basic Metabolic Panel:  Recent Labs Lab 03/03/17 1517  NA 137  K 3.9  CL 107  CO2 21*  GLUCOSE 98  BUN 12  CREATININE 0.77  CALCIUM 8.9   GFR: Estimated Creatinine Clearance: 51.6 mL/min (by C-G formula based on SCr of 0.77 mg/dL). Liver Function Tests:  Recent Labs Lab 03/03/17 1517  AST 163*  ALT 35  ALKPHOS 487*  BILITOT 1.0  PROT 8.9*  ALBUMIN 3.3*    Recent  Labs Lab 03/03/17 1517  LIPASE 44   No results for input(s): AMMONIA in the last 168 hours. Coagulation Profile:  Recent Labs Lab 03/03/17 2030  INR 1.07   Cardiac Enzymes: No results for input(s): CKTOTAL, CKMB, CKMBINDEX, TROPONINI in the last 168 hours. BNP (last 3 results) No results for input(s): PROBNP in the last 8760 hours. HbA1C: No results for input(s): HGBA1C in the last 72 hours. CBG: No results for input(s): GLUCAP in the last 168 hours. Lipid Profile: No results for input(s): CHOL, HDL, LDLCALC, TRIG, CHOLHDL, LDLDIRECT in the last 72 hours. Thyroid Function Tests: No results for input(s): TSH, T4TOTAL, FREET4, T3FREE, THYROIDAB in the last 72 hours. Anemia Panel: No results for input(s): VITAMINB12, FOLATE, FERRITIN, TIBC, IRON, RETICCTPCT in the last 72 hours. Sepsis  Labs:  Recent Labs Lab 03/03/17 1533  LATICACIDVEN 1.16    No results found for this or any previous visit (from the past 240 hour(s)).    Radiology Studies: No results found.  Scheduled Meds: . levETIRAcetam  1,000 mg Oral BID   Continuous Infusions:    LOS: 3 days    Time spent: > 35 minutes  Velvet Bathe, MD [facility] Pager [phone]  If 7PM-7AM, please contact night-coverage [email or web address] Password [email or web address] 03/07/2017, 1:30 PM

## 2017-03-07 NOTE — Progress Notes (Addendum)
Daily Progress Note   Patient Name: [name] [name]       Date: 03/07/2017 DOB: 1956/01/25  Age: 61 y.o. MRN#: [id number] Attending Physician: Velvet Bathe, MD Primary Care Physician: Ladell Pier, MD Admit Date: 03/03/2017  Reason for Consultation/Follow-up: Establishing goals of care  Subjective: Patient resting comfortably in bed, no complaints. Patient's caregiver, [name], at bedside.  Length of Stay: 3  Current Medications: Scheduled Meds:  . levETIRAcetam  1,000 mg Oral BID    Continuous Infusions:   PRN Meds: LORazepam, ondansetron **OR** ondansetron (ZOFRAN) IV  Physical Exam  Constitutional: She is cooperative. She has a sickly appearance. No distress.  HENT:  Head: Normocephalic and atraumatic.  Pulmonary/Chest: Effort normal. No respiratory distress.  Abdominal: She exhibits distension.  Neurological: She is alert. She is disoriented.  Skin: Skin is warm and dry. She is not diaphoretic.  Psychiatric: She has a normal mood and affect. Her speech is delayed. She is slowed and withdrawn. Cognition and memory are impaired. She exhibits abnormal recent memory and abnormal remote memory.            Vital Signs: BP 113/65 (BP Location: Left Arm)   Pulse 89   Temp 98.2 F (36.8 C) (Oral)   Resp 17   Wt 43.5 kg (95 lb 14.4 oz)   SpO2 99%   BMI 19.37 kg/m  SpO2: SpO2: 99 % O2 Device: O2 Device: Not Delivered O2 Flow Rate:    Intake/output summary:  Intake/Output Summary (Last 24 hours) at 03/07/17 1431 Last data filed at 03/07/17 0931  Gross per 24 hour  Intake              240 ml  Output                0 ml  Net              240 ml   LBM: Last BM Date: 03/05/17 Baseline Weight: Weight: 44.5 kg (98 lb 1 oz) Most recent weight: Weight: 43.5 kg (95 lb 14.4 oz)       Palliative Assessment/Data: 30%    Flowsheet Rows     Most Recent Value  Intake Tab  Referral Department  Hospitalist  Unit at Time of Referral  Med/Surg Unit  Palliative Care Primary Diagnosis  Cancer  Date Notified  03/04/17  Palliative Care Type  New Palliative care  Reason for referral  Clarify Goals of Care  Date of Admission  03/03/17  Date first seen by Palliative Care  03/05/17  # of days Palliative referral response time  1 Day(s)  # of days IP prior to Palliative referral  1  Clinical Assessment  Palliative Performance Scale Score  30%  Pain Max last 24 hours  Not able to report  Pain Min Last 24 hours  Not able to report  Dyspnea Max Last 24 Hours  Not able to report  Dyspnea Min Last 24 hours  Not able to report  Nausea Max Last 24 Hours  Not able to report  Nausea Min Last 24 Hours  Not able to report  Anxiety Max Last 24 Hours  Not able to report  Anxiety Min Last 24 Hours  Not able to report  Other Max Last 24 Hours  Not able to report  Psychosocial & Spiritual Assessment  Palliative Care Outcomes  Patient/Family meeting held?  Yes  Who was at the meeting?  spoke to mother via phone  Patient/Family wishes: Interventions discontinued/not started   Mechanical Ventilation  Palliative Care follow-up planned  Yes, Facility      Patient Active Problem List   Diagnosis Date Noted  . Palliative care by specialist   . Metastatic cancer ([place]) 03/04/2017  . Liver metastases ([place])   . Tinea capitis 12/16/2015  . Screening examination for venereal disease 01/14/2015  . Substance abuse ([place]) 01/14/2015  . History of UTI 12/20/2014  . Protein-calorie malnutrition, severe ([place]) 11/11/2014  . Anemia   . Thrombocytopenia ([place])   . Loss of weight 10/14/2014  . Encounter for long-term (current) use of medications 02/05/2014  . Loss of consciousness ([place]) 09/05/2013  . Blurry vision, right eye 08/23/2013  . Underweight 08/23/2013  . Periodontal disease 09/01/2010    . TREMOR 04/25/2007  . AMENORRHEA 09/21/2006  . Human immunodeficiency virus (HIV) disease ([place]) 03/11/2006  . SYPHILIS, EARLY, SYMPTOMATIC, SECONDARY NOS 03/11/2006  . DEPENDENCE, ALCOHOL NEC/NOS, UNSPECIFIED 03/11/2006  . Depressed 03/11/2006  . Seizures ([place]) 03/11/2006  . Dementia 03/11/2006  . HEPATITIS B, HX OF 03/11/2006    Palliative Care Assessment & Plan   HPI: 61 y.o. female  with past medical history of HIV (untreated for over a year), dementia, seizures, history of a head injury, history of substance use disorder admitted on 03/03/2017 with increased abdominal swelling.  She was brought to the emergency room by her caregiver, Ambrose Finland.  Patient was found to be anemic.  CT scan of the abdomen was performed and multiple necrotic lesions, ill-defined pelvic mass, concerning for infiltrative tumor, no neoplastic process observed in the chest. PMT consulted for goals of care.  Assessment: This NP and Vinie Sill, NP met with patient and caregiver at bedside. Patient is pleasant and answers questions. Caregiver shares that she has cared for the patient and her mother for the past 4 years. She shares that patient's mother has Alzheimer's and is unable to offer any support in caring for the patient at home. She shares that the patient's mental status has become increasingly worse and before she was brought in to the hospital she was weak, not eating, and more forgetful. There is no one in the patient's home to provide 24/7 care for her at home and the caregiver does not believe it would be safe for the patient to return home in the state she is in. Caregiver shares that there is no other family except for one son, Jannette Fogo - he would be her legal healthcare care proxy as patient is unable to make decisions for herself. However, son is estranged. He lives locally and caregiver provided contact information. Caregiver shares that son became angry last time he was asked to make  healthcare decisions for his mother. PMT has tried multiple times to contact son from numbers provided with no answer. Caregiver is wondering if patient is appropriate for inpatient Hospice.   Recommendations/Plan:  Will continue to attempt to contact patient's son to inform him of patient's status and seek guidance for medical decisions  Goals of Care and Additional Recommendations:  Limitations on Scope of Treatment: Full Scope Treatment  Code Status:  DNR  Prognosis:   Unable to determine  Discharge Planning:  To Be Determined  Care plan was discussed  with patient and caregiver  Thank you for allowing the Palliative Medicine Team to assist in the care of this patient.   Time In: 14:00 Time Out: 14:30 Total Time 30 minutes Prolonged Time Billed  no       Greater than 50%  of this time was spent counseling and coordinating care related to the above assessment and plan.  Juel Burrow, DNP, AGNP-C Palliative Medicine Team Team Phone # [phone]

## 2017-03-08 ENCOUNTER — Ambulatory Visit: Payer: Self-pay | Admitting: Internal Medicine

## 2017-03-08 DIAGNOSIS — Z7189 Other specified counseling: Secondary | ICD-10-CM

## 2017-03-08 DIAGNOSIS — Z515 Encounter for palliative care: Secondary | ICD-10-CM

## 2017-03-08 NOTE — Progress Notes (Signed)
PROGRESS NOTE    [name] [name]  HFG:[id number] DOB: 1955-10-02 DOA: 03/03/2017 PCP: Ladell Pier, MD   Brief Narrative:  61 y.o. female with medical history significant of HIV untreated for over a year, dementia, seizures brought in by a care giver for increasing abdominal girth.  Pt cannot provide any history due to her dementia  Pt diagnosed with metastatic cancer of unknown origin. Currently plan is to figure out whether aggressive therapy or comfort care. I do not recommend aggressive therapy and have discussed that with caregiver. Palliative on board and assisting with disposition recommendations.  Assessment & Plan:   Principal Problem:   Metastatic cancer ([place]) - Palliative has evaluated and mother is aware that patient has terminal illness - disposition pending discussion with family as patient in unable to make her own medical decisions due to dementia. I do not believe patient is able to understand her illness and how her decisions will influence her outcome. - Palliative medicine consulted who are assisting with disposition and goals of care. Complicated social situation please refer last palliative care note.  Active Problems:   Human immunodeficiency virus (HIV) disease ([place]) - Pt has been off medications for 1 year per reports in chart.    Seizures ([place]) - stable continue Keppra    Dementia   Anemia   Substance abuse ([place])   DVT prophylaxis: SCD's Code Status: DNR Family Communication: none at bedside. Disposition Plan: to be determined. Palliative assisting   Consultants:   Palliative   Procedures: none   Antimicrobials: none   Subjective: No new complaints reported  Objective: Vitals:   03/07/17 1508 03/07/17 2100 03/08/17 0500 03/08/17 0550  BP: 116/74 111/64  101/60  Pulse: (!) 103 97  88  Resp: 16 16  16   Temp: (!) 97.5 F (36.4 C) 97.7 F (36.5 C)  98.3 F (36.8 C)  TempSrc: Oral Oral  Axillary  SpO2: 100% 100%  100%  Weight:    43.8 kg (96 lb 9.6 oz)     Intake/Output Summary (Last 24 hours) at 03/08/17 1436 Last data filed at 03/07/17 1733  Gross per 24 hour  Intake              240 ml  Output                0 ml  Net              240 ml   Filed Weights   03/06/17 0500 03/07/17 0500 03/08/17 0500  Weight: 43.5 kg (96 lb) 43.5 kg (95 lb 14.4 oz) 43.8 kg (96 lb 9.6 oz)    Examination: Exam unchaged when compared to last 03/07/17  General exam: Appears calm and comfortable, in nad.  Respiratory system: Clear to auscultation. Respiratory effort normal. Cardiovascular system: S1 & S2 heard, RRR. No JVD, murmurs, rubs Gastrointestinal system: Abdomen is nondistended, soft and nontender. No organomegaly or masses felt. Normal bowel sounds heard. Central nervous system: Alert and awake, no facial asymmetry Extremities: Symmetric 5 x 5 power. Skin: No rashes, lesions or ulcers, on limited exam. Psychiatry: unable to assess well due to dementia, judgement impaired.    Data Reviewed: I have personally reviewed following labs and imaging studies  CBC:  Recent Labs Lab 03/03/17 1517  WBC 3.9*  NEUTROABS 2.6  HGB 8.1*  HCT 25.0*  MCV 76.7*  PLT 080   Basic Metabolic Panel:  Recent Labs Lab 03/03/17 1517  NA 137  K 3.9  CL  107  CO2 21*  GLUCOSE 98  BUN 12  CREATININE 0.77  CALCIUM 8.9   GFR: Estimated Creatinine Clearance: 51.6 mL/min (by C-G formula based on SCr of 0.77 mg/dL). Liver Function Tests:  Recent Labs Lab 03/03/17 1517  AST 163*  ALT 35  ALKPHOS 487*  BILITOT 1.0  PROT 8.9*  ALBUMIN 3.3*    Recent Labs Lab 03/03/17 1517  LIPASE 44   No results for input(s): AMMONIA in the last 168 hours. Coagulation Profile:  Recent Labs Lab 03/03/17 2030  INR 1.07   Cardiac Enzymes: No results for input(s): CKTOTAL, CKMB, CKMBINDEX, TROPONINI in the last 168 hours. BNP (last 3 results) No results for input(s): PROBNP in the last 8760 hours. HbA1C: No results for  input(s): HGBA1C in the last 72 hours. CBG: No results for input(s): GLUCAP in the last 168 hours. Lipid Profile: No results for input(s): CHOL, HDL, LDLCALC, TRIG, CHOLHDL, LDLDIRECT in the last 72 hours. Thyroid Function Tests: No results for input(s): TSH, T4TOTAL, FREET4, T3FREE, THYROIDAB in the last 72 hours. Anemia Panel: No results for input(s): VITAMINB12, FOLATE, FERRITIN, TIBC, IRON, RETICCTPCT in the last 72 hours. Sepsis Labs:  Recent Labs Lab 03/03/17 1533  LATICACIDVEN 1.16    No results found for this or any previous visit (from the past 240 hour(s)).    Radiology Studies: No results found.  Scheduled Meds: . levETIRAcetam  1,000 mg Oral BID   Continuous Infusions:    LOS: 4 days    Time spent: > 35 minutes  Velvet Bathe, MD [facility] Pager [phone]  If 7PM-7AM, please contact night-coverage [email or web address] Password [email or web address] 03/08/2017, 2:36 PM

## 2017-03-08 NOTE — Progress Notes (Signed)
Daily Progress Note   Patient Name: [name] [name]       Date: 03/08/2017 DOB: 1955/06/20  Age: 61 y.o. MRN#: [id number] Attending Physician: [name] Bathe, MD Primary Care Physician: [name] Pier, MD Admit Date: 03/03/2017  Reason for Consultation/Follow-up: Establishing goals of care  Subjective: [name] [name] is sitting on side of bed. Just finished lunch and has her cake in front of her untouched (unsure how much she ate). No visitor or family at bedside.   Length of Stay: 4  Current Medications: Scheduled Meds:  . levETIRAcetam  1,000 mg Oral BID    Continuous Infusions:   PRN Meds: LORazepam, ondansetron **OR** ondansetron (ZOFRAN) IV  Physical Exam  Constitutional: She is cooperative. She has a sickly appearance. No distress.  HENT:  Head: Normocephalic and atraumatic.  Pulmonary/Chest: Effort normal. No respiratory distress.  Abdominal: She exhibits distension.  Neurological: She is alert. She is disoriented.  Skin: Skin is warm and dry. She is not diaphoretic.  Psychiatric: She has a normal mood and affect. Her speech is delayed. She is slowed and withdrawn. Cognition and memory are impaired. She exhibits abnormal recent memory and abnormal remote memory.            Vital Signs: BP 123/80 (BP Location: Left Wrist)   Pulse 100   Temp 99 F (37.2 C) (Oral)   Resp 16   Wt 43.8 kg (96 lb 9.6 oz)   SpO2 100%   BMI 19.51 kg/m  SpO2: SpO2: 100 % O2 Device: O2 Device: Not Delivered O2 Flow Rate:    Intake/output summary:   Intake/Output Summary (Last 24 hours) at 03/08/17 1514 Last data filed at 03/07/17 1733  Gross per 24 hour  Intake              240 ml  Output                0 ml  Net              240 ml   LBM: Last BM Date: 03/08/17 Baseline Weight: Weight:  44.5 kg (98 lb 1 oz) Most recent weight: Weight: 43.8 kg (96 lb 9.6 oz)       Palliative Assessment/Data: 30%    Flowsheet Rows     Most Recent Value  Intake Tab  Referral Department  Hospitalist  Unit at Time of Referral  Med/Surg Unit  Palliative Care Primary Diagnosis  Cancer  Date Notified  03/04/17  Palliative Care Type  New Palliative care  Reason for referral  Clarify Goals of Care  Date of Admission  03/03/17  Date first seen by Palliative Care  03/05/17  # of days Palliative referral response time  1 Day(s)  # of days IP prior to Palliative referral  1  Clinical Assessment  Palliative Performance Scale Score  30%  Pain Max last 24 hours  Not able to report  Pain Min Last 24 hours  Not able to report  Dyspnea Max Last 24 Hours  Not able to report  Dyspnea Min Last 24 hours  Not able to report  Nausea Max Last 24 Hours  Not able to report  Nausea Min Last 24 Hours  Not able to report  Anxiety Max Last 24 Hours  Not able to report  Anxiety Min Last 24 Hours  Not able to report  Other Max Last 24 Hours  Not able to report  Psychosocial & Spiritual Assessment  Palliative Care Outcomes  Patient/Family meeting held?  Yes  Who was at the meeting?  spoke to mother via phone  Patient/Family wishes: Interventions discontinued/not started   Mechanical Ventilation  Palliative Care follow-up planned  Yes, Facility      Patient Active Problem List   Diagnosis Date Noted  . Palliative care by specialist   . Metastatic cancer ([place]) 03/04/2017  . Liver metastases ([place])   . Tinea capitis 12/16/2015  . Screening examination for venereal disease 01/14/2015  . Substance abuse ([place]) 01/14/2015  . History of UTI 12/20/2014  . Protein-calorie malnutrition, severe ([place]) 11/11/2014  . Anemia   . Thrombocytopenia ([place])   . Loss of weight 10/14/2014  . Encounter for long-term (current) use of medications 02/05/2014  . Loss of consciousness ([place]) 09/05/2013  . Blurry vision, right  eye 08/23/2013  . Underweight 08/23/2013  . Periodontal disease 09/01/2010  . TREMOR 04/25/2007  . AMENORRHEA 09/21/2006  . Human immunodeficiency virus (HIV) disease ([place]) 03/11/2006  . SYPHILIS, EARLY, SYMPTOMATIC, SECONDARY NOS 03/11/2006  . DEPENDENCE, ALCOHOL NEC/NOS, UNSPECIFIED 03/11/2006  . Depressed 03/11/2006  . Seizures ([place]) 03/11/2006  . Dementia 03/11/2006  . HEPATITIS B, HX OF 03/11/2006    Palliative Care Assessment & Plan   HPI: 61 y.o. female  with past medical history of HIV (untreated for over a year), dementia, seizures, history of a head injury, history of substance use disorder admitted on 03/03/2017 with increased abdominal swelling.  She was brought to the emergency room by her caregiver, [name] [name].  Patient was found to be anemic.  CT scan of the abdomen was performed and multiple necrotic lesions, ill-defined pelvic mass, concerning for infiltrative tumor, no neoplastic process observed in the chest. PMT consulted for goals of care.  Assessment: Attempted to reach mother but no answer and no option for voicemail available. Still unable to reach son and also no option for voicemail. Have placed CSW consults to assist with contacting son and/or beginning process for guardianship.   My recommendation for [name] [name] would be nursing home placement with hospice to follow. Not eligible for hospice facility and per caregiver she does not have 24 assistance at home. Also poor candidate for treatment.   Will continue to attempt to contact and discuss [name] [name] with family.   Recommendations/Plan:  Will continue to attempt to contact patient's son to inform him of patient's status and seek guidance for medical decisions  Goals of Care and Additional Recommendations:  Limitations on Scope of Treatment: ???  Code Status:  DNR  Prognosis:   Unable to determine  Discharge Planning:  To Be Determined  Care plan was discussed with patient and caregiver  Thank  you for allowing the Palliative Medicine Team to assist in the care of this patient.   Total Time 15 minutes Prolonged Time Billed  no       Greater than 50%  of this time was spent counseling and coordinating care related to the above assessment and plan.  [name] Sill, NP Palliative Medicine Team Pager # [phone] (M-F 8a-5p) Team Phone # [phone] (Nights/Weekends)

## 2017-03-09 DIAGNOSIS — B20 Human immunodeficiency virus [HIV] disease: Secondary | ICD-10-CM

## 2017-03-09 DIAGNOSIS — C799 Secondary malignant neoplasm of unspecified site: Secondary | ICD-10-CM

## 2017-03-09 DIAGNOSIS — D649 Anemia, unspecified: Secondary | ICD-10-CM

## 2017-03-09 DIAGNOSIS — D72819 Decreased white blood cell count, unspecified: Secondary | ICD-10-CM

## 2017-03-09 DIAGNOSIS — R569 Unspecified convulsions: Secondary | ICD-10-CM

## 2017-03-09 DIAGNOSIS — F0390 Unspecified dementia without behavioral disturbance: Secondary | ICD-10-CM

## 2017-03-09 DIAGNOSIS — C787 Secondary malignant neoplasm of liver and intrahepatic bile duct: Principal | ICD-10-CM

## 2017-03-09 MED ORDER — ELVITEG-COBIC-EMTRICIT-TENOFAF 150-150-200-10 MG PO TABS
1.0000 | ORAL_TABLET | Freq: Every day | ORAL | Status: DC
  Administered 2017-03-09 – 2017-03-14 (×6): 1 via ORAL
  Filled 2017-03-09 (×6): qty 1

## 2017-03-09 MED ORDER — ENSURE ENLIVE PO LIQD
237.0000 mL | Freq: Two times a day (BID) | ORAL | Status: DC
  Administered 2017-03-10 – 2017-03-14 (×10): 237 mL via ORAL

## 2017-03-09 NOTE — Progress Notes (Signed)
PROGRESS NOTE    [name] [name]  GXQ:[id number] DOB: Jul 01, 1955 DOA: 03/03/2017 PCP: Ladell Pier, MD   Brief Narrative: [name] [name] is a 61 y.o. old female with a history of HIV, dementia, seizure disorder.  Patient presented secondary to increasing abdominal found to have likely metastatic disease of unknown origin.  Medicine is on board for goals of care.  Patient is demented which complicates the situation.    Assessment & Plan:   Principal Problem:   Metastatic cancer ([place]) Active Problems:   Human immunodeficiency virus (HIV) disease ([place])   Seizures ([place])   Dementia   Anemia   Substance abuse ([place])   Palliative care by specialist   Metastatic cancer Unknown primary. Evidence of liver lesions with necrotic tissue.  -palliative care recommendations -will continue to attempt to contact family  HIV Not currently on antivirals. Last quant was undetectable in 2017. Last CD4 count of 500 in 2018.  Seizure disorder Stable. -continue Keppra  Dementia Patient is oriented to person and place.  Anemia Acute drop. Unknown cause. No evidence of bleed on CT chest/abdomen/pelvis. -repeat CBC   DVT prophylaxis: SCDs Code Status: DNR Family Communication: None. Could not reach by phone Disposition Plan: Discharge pending family discussion/goals   Consultants:   Palliative care medicine  Procedures:   None  Antimicrobials:  None    Subjective: No concerns today. No pain.  Objective: Vitals:   03/08/17 0550 03/08/17 1506 03/08/17 2132 03/09/17 0603  BP: 101/60 123/80 128/75 112/65  Pulse: 88 100 89 85  Resp: 16  16 14   Temp: 98.3 F (36.8 C) 99 F (37.2 C) 98.5 F (36.9 C) 98.7 F (37.1 C)  TempSrc: Axillary Oral Oral Oral  SpO2: 100% 100% 100% 100%  Weight:        Intake/Output Summary (Last 24 hours) at 03/09/17 1345 Last data filed at 03/09/17 0830  Gross per 24 hour  Intake              240 ml  Output                0 ml  Net               240 ml   Filed Weights   03/06/17 0500 03/07/17 0500 03/08/17 0500  Weight: 43.5 kg (96 lb) 43.5 kg (95 lb 14.4 oz) 43.8 kg (96 lb 9.6 oz)    Examination:  General exam: Appears calm and comfortable  Respiratory system: Clear to auscultation. Respiratory effort normal. Cardiovascular system: S1 & S2 heard, RRR. No murmurs. Gastrointestinal system: Abdomen is distended, soft and nontender. Right upper quadrant firm mass. Normal bowel sounds heard. Central nervous system: Alert and oriented to person and place. Extremities: No edema. No calf tenderness Skin: No cyanosis. No rashes Psychiatry: Judgement and insight appear impaired. Mood & affect depressed and flat.     Data Reviewed: I have personally reviewed following labs and imaging studies  CBC:  Recent Labs Lab 03/03/17 1517  WBC 3.9*  NEUTROABS 2.6  HGB 8.1*  HCT 25.0*  MCV 76.7*  PLT 144   Basic Metabolic Panel:  Recent Labs Lab 03/03/17 1517  NA 137  K 3.9  CL 107  CO2 21*  GLUCOSE 98  BUN 12  CREATININE 0.77  CALCIUM 8.9   GFR: Estimated Creatinine Clearance: 51.6 mL/min (by C-G formula based on SCr of 0.77 mg/dL). Liver Function Tests:  Recent Labs Lab 03/03/17 1517  AST 163*  ALT 35  ALKPHOS 487*  BILITOT 1.0  PROT 8.9*  ALBUMIN 3.3*    Recent Labs Lab 03/03/17 1517  LIPASE 44   No results for input(s): AMMONIA in the last 168 hours. Coagulation Profile:  Recent Labs Lab 03/03/17 2030  INR 1.07   Cardiac Enzymes: No results for input(s): CKTOTAL, CKMB, CKMBINDEX, TROPONINI in the last 168 hours. BNP (last 3 results) No results for input(s): PROBNP in the last 8760 hours. HbA1C: No results for input(s): HGBA1C in the last 72 hours. CBG: No results for input(s): GLUCAP in the last 168 hours. Lipid Profile: No results for input(s): CHOL, HDL, LDLCALC, TRIG, CHOLHDL, LDLDIRECT in the last 72 hours. Thyroid Function Tests: No results for input(s): TSH, T4TOTAL,  FREET4, T3FREE, THYROIDAB in the last 72 hours. Anemia Panel: No results for input(s): VITAMINB12, FOLATE, FERRITIN, TIBC, IRON, RETICCTPCT in the last 72 hours. Sepsis Labs:  Recent Labs Lab 03/03/17 1533  LATICACIDVEN 1.16    No results found for this or any previous visit (from the past 240 hour(s)).       Radiology Studies: No results found.      Scheduled Meds: . levETIRAcetam  1,000 mg Oral BID   Continuous Infusions:   LOS: 5 days     Cordelia Poche, MD [facility] 03/09/2017, 1:45 PM Pager: [phone]  If 7PM-7AM, please contact night-coverage [email or web address] Password [email or web address] 03/09/2017, 1:45 PM

## 2017-03-09 NOTE — NC FL2 (Signed)
[place] LEVEL OF CARE SCREENING TOOL     IDENTIFICATION  Patient Name: PANDORA MCCRACKIN Birthdate: 05-Jan-1956 Sex: female Admission Date (Current Location): 03/03/2017  [facility] and [place] Number:  Herbalist and Address:  [facility] [place]. [facility], [place] [address] [place], [place] [address]      Provider Number: [id number]  Attending Physician Name and Address:  Mariel Aloe, MD  Relative Name and Phone Number:       Current Level of Care: Hospital Recommended Level of Care: [place] Prior Approval Number:    Date Approved/Denied:   PASRR Number:    Discharge Plan: SNF    Current Diagnoses: Patient Active Problem List   Diagnosis Date Noted  . Palliative care by specialist   . Metastatic cancer ([place]) 03/04/2017  . Liver metastases ([place])   . Tinea capitis 12/16/2015  . Screening examination for venereal disease 01/14/2015  . Substance abuse ([place]) 01/14/2015  . History of UTI 12/20/2014  . Protein-calorie malnutrition, severe ([place]) 11/11/2014  . Anemia   . Thrombocytopenia ([place])   . Loss of weight 10/14/2014  . Encounter for long-term (current) use of medications 02/05/2014  . Loss of consciousness ([place]) 09/05/2013  . Blurry vision, right eye 08/23/2013  . Underweight 08/23/2013  . Periodontal disease 09/01/2010  . TREMOR 04/25/2007  . AMENORRHEA 09/21/2006  . Human immunodeficiency virus (HIV) disease ([place]) 03/11/2006  . SYPHILIS, EARLY, SYMPTOMATIC, SECONDARY NOS 03/11/2006  . DEPENDENCE, ALCOHOL NEC/NOS, UNSPECIFIED 03/11/2006  . Depressed 03/11/2006  . Seizures ([place]) 03/11/2006  . Dementia 03/11/2006  . HEPATITIS B, HX OF 03/11/2006    Orientation RESPIRATION BLADDER Height & Weight     Self  Normal Continent Weight: 96 lb 9.6 oz (43.8 kg) Height:     BEHAVIORAL SYMPTOMS/MOOD NEUROLOGICAL BOWEL NUTRITION STATUS      Continent Diet (cardiac)  AMBULATORY STATUS COMMUNICATION OF NEEDS Skin    Limited Assist Verbally Normal                       Personal Care Assistance Level of Assistance  Bathing, Dressing Bathing Assistance: Limited assistance   Dressing Assistance: Limited assistance     Functional Limitations Info             SPECIAL CARE FACTORS FREQUENCY                       Contractures      Additional Factors Info  Code Status, Allergies, Psychotropic Code Status Info: DNR Allergies Info: NKA Psychotropic Info: ativan         Current Medications (03/09/2017):  This is the current hospital active medication list Current Facility-Administered Medications  Medication Dose Route Frequency Provider Last Rate Last Dose  . levETIRAcetam (KEPPRA) tablet 1,000 mg  1,000 mg Oral BID Derrill Kay A, MD   1,000 mg at 03/09/17 1025  . LORazepam (ATIVAN) tablet 1 mg  1 mg Oral Q8H PRN Phillips Grout, MD      . ondansetron [facility]) tablet 4 mg  4 mg Oral Q6H PRN Phillips Grout, MD   4 mg at 03/05/17 1118   Or  . ondansetron (ZOFRAN) injection 4 mg  4 mg Intravenous Q6H PRN Phillips Grout, MD         Discharge Medications: Please see discharge summary for a list of discharge medications.  Relevant Imaging Results:  Relevant Lab Results:   Additional  Information SS#: [id number]  Jorge Ny, LCSW

## 2017-03-09 NOTE — Progress Notes (Signed)
Daily Progress Note   Patient Name: [name] [name]       Date: 03/09/2017 DOB: 27-Feb-1956  Age: 61 y.o. MRN#: [id number] Attending Physician: Mariel Aloe, MD Primary Care Physician: Ladell Pier, MD Admit Date: 03/03/2017  Reason for Consultation/Follow-up: Establishing goals of care  Subjective: [name] [name] is sitting on side of bed visiting with her caregiver and her mother.   Length of Stay: 5  Current Medications: Scheduled Meds:  . levETIRAcetam  1,000 mg Oral BID    Continuous Infusions:   PRN Meds: LORazepam, ondansetron **OR** ondansetron (ZOFRAN) IV  Physical Exam  Constitutional: She is cooperative. She has a sickly appearance. No distress.  HENT:  Head: Normocephalic and atraumatic.  Pulmonary/Chest: Effort normal. No respiratory distress.  Abdominal: She exhibits distension.  Neurological: She is alert. She is disoriented.  Skin: Skin is warm and dry. She is not diaphoretic.  Psychiatric: She has a normal mood and affect. Her speech is delayed. She is slowed and withdrawn. Cognition and memory are impaired. She exhibits abnormal recent memory and abnormal remote memory.            Vital Signs: BP 112/65   Pulse 85   Temp 98.7 F (37.1 C) (Oral)   Resp 14   Wt 43.8 kg (96 lb 9.6 oz)   SpO2 100%   BMI 19.51 kg/m  SpO2: SpO2: 100 % O2 Device: O2 Device: Not Delivered O2 Flow Rate:    Intake/output summary:   Intake/Output Summary (Last 24 hours) at 03/09/17 1457 Last data filed at 03/09/17 0830  Gross per 24 hour  Intake              240 ml  Output                0 ml  Net              240 ml   LBM: Last BM Date: 03/08/17 Baseline Weight: Weight: 44.5 kg (98 lb 1 oz) Most recent weight: Weight: 43.8 kg (96 lb 9.6 oz)       Palliative  Assessment/Data: 30%    Flowsheet Rows     Most Recent Value  Intake Tab  Referral Department  Hospitalist  Unit at Time of Referral  Med/Surg Unit  Palliative Care Primary Diagnosis  Cancer  Date Notified  03/04/17  Palliative Care Type  New Palliative care  Reason for referral  Clarify Goals of Care  Date of Admission  03/03/17  Date first seen by Palliative Care  03/05/17  # of days Palliative referral response time  1 Day(s)  # of days IP prior to Palliative referral  1  Clinical Assessment  Palliative Performance Scale Score  30%  Pain Max last 24 hours  Not able to report  Pain Min Last 24 hours  Not able to report  Dyspnea Max Last 24 Hours  Not able to report  Dyspnea Min Last 24 hours  Not able to report  Nausea Max Last 24 Hours  Not able to report  Nausea Min Last 24 Hours  Not able to report  Anxiety Max Last 24 Hours  Not able to report  Anxiety Min  Last 24 Hours  Not able to report  Other Max Last 24 Hours  Not able to report  Psychosocial & Spiritual Assessment  Palliative Care Outcomes  Patient/Family meeting held?  Yes  Who was at the meeting?  spoke to mother via phone  Patient/Family wishes: Interventions discontinued/not started   Mechanical Ventilation  Palliative Care follow-up planned  Yes, Facility      Patient Active Problem List   Diagnosis Date Noted  . Palliative care by specialist   . Metastatic cancer ([place]) 03/04/2017  . Liver metastases ([place])   . Tinea capitis 12/16/2015  . Screening examination for venereal disease 01/14/2015  . Substance abuse ([place]) 01/14/2015  . History of UTI 12/20/2014  . Protein-calorie malnutrition, severe ([place]) 11/11/2014  . Anemia   . Thrombocytopenia ([place])   . Loss of weight 10/14/2014  . Encounter for long-term (current) use of medications 02/05/2014  . Loss of consciousness ([place]) 09/05/2013  . Blurry vision, right eye 08/23/2013  . Underweight 08/23/2013  . Periodontal disease 09/01/2010  . TREMOR  04/25/2007  . AMENORRHEA 09/21/2006  . Human immunodeficiency virus (HIV) disease ([place]) 03/11/2006  . SYPHILIS, EARLY, SYMPTOMATIC, SECONDARY NOS 03/11/2006  . DEPENDENCE, ALCOHOL NEC/NOS, UNSPECIFIED 03/11/2006  . Depressed 03/11/2006  . Seizures ([place]) 03/11/2006  . Dementia 03/11/2006  . HEPATITIS B, HX OF 03/11/2006    Palliative Care Assessment & Plan   HPI: 61 y.o. female  with past medical history of HIV (untreated for over a year), dementia, seizures, history of a head injury, history of substance use disorder admitted on 03/03/2017 with increased abdominal swelling.  She was brought to the emergency room by her caregiver, Ambrose Finland.  Patient was found to be anemic.  CT scan of the abdomen was performed and multiple necrotic lesions, ill-defined pelvic mass, concerning for infiltrative tumor, no neoplastic process observed in the chest. PMT consulted for goals of care.  Assessment: I met today at [name] [name] bedside with her caregiver, [name], and her mother who is in wheelchair with broken foot. Her mother says that she does not know what is going on "no one has told me anything" and says that she has not spoken with anyone from the hospital. She did speak with the social worker this morning - [name] confirms. Also my partner Romona Curls, NP had a conversation with her 03/05/17 explaining new cancer finding. I explained the scans and poor prognosis. I did give my recommendation of transition to nursing home placement (not eligible for hospice facility and not enough care to support her at home). I do believe she would be eligible for hospice services at nursing home. Her mother was very disappointed that she cannot come home and has had bad experiences in nursing facilities and does not want her daughter to go BUT understands that there are not really any other options. Discussed with CSW. Family was also concerned about her taking her HIV medications as she was taking at home and had  an appointment with Dr. Linus Salmons this week. Discussed with Dr. Lonny Prude.    Recommendations/Plan: I still DO NOT have a decision maker. Mother with dementia cannot make informed decisions, son will not answer phone and no voicemail, caregiver is paid and not a Media planner. Will need to pursue guardianship.   I would also recommend appointment with oncology for input and recommendations.   Goals of Care and Additional Recommendations:  Limitations on Scope of Treatment: ???  Code Status:  DNR  Prognosis:   Unable  to determine - < 6 months, hospice eligible with no treatment of cancer  Discharge Planning:  To Be Determined. Needs nursing home placement.   Care plan was discussed with patient and caregiver  Thank you for allowing the Palliative Medicine Team to assist in the care of this patient.   Total Time 35 minutes Prolonged Time Billed  no       Greater than 50%  of this time was spent counseling and coordinating care related to the above assessment and plan.  Vinie Sill, NP Palliative Medicine Team Pager # [phone] (M-F 8a-5p) Team Phone # [phone] (Nights/Weekends)

## 2017-03-09 NOTE — Progress Notes (Signed)
   03/09/17 1200  Clinical Encounter Type  Visited With Patient  Referral From Social work  Consult/Referral To [facility] visited with PT per suggestion of SW.  PT was not very talkative and paid more attention to the television than the engagement with the [facility].  PT was lying down at the interaction with the Chaplain and only provided one word answers or shaking her head.

## 2017-03-09 NOTE — Social Work (Signed)
PASSR is pending which is needed for SNF placement. CSW will leave 30 day note on chart for doctor to sign.  PT/OT has been ordered.  CSW discussed case with [facility] and [facility], therapy pending.  CSW will continue to follow.  Elissa Hefty, LCSW Clinical Social Worker [phone]

## 2017-03-09 NOTE — Clinical Social Work Note (Signed)
Clinical Social Work Assessment  Patient Details  Name: [name] [name] MRN: [id number] Date of Birth: 08/25/1955  Date of referral:  03/09/17               Reason for consult:  Facility Placement                Permission sought to share information with:  Chartered certified accountant granted to share information::  Yes, Verbal Permission Granted  Name::     Education officer, environmental::  SNF  Relationship::     Contact Information:     Housing/Transportation Living arrangements for the past 2 months:  Single Family Home Source of Information:  Other (Comment Required), Parent (Caregiver) Patient Interpreter Needed:  None Criminal Activity/Legal Involvement Pertinent to Current Situation/Hospitalization:  No - Comment as needed Significant Relationships:  Adult Children, Other Family Members, Friend Lives with:  Parents Do you feel safe going back to the place where you live?  No Need for family participation in patient care:  Yes (Comment)  Care giving concerns:  CSW spoke with caregiver and mom on the phone. Pt is from with mom and has caregiver that provides care daily but not 24 hours a day. Given her chronic condition, patient will need SNF care as her needs require 24 hour assistance. CSW was advised by Palliative team that patient not hospice appropriate at this time.  In addition, patient unable to make decisions for herself as she is only oriented to person. There is no HCPOA on file nor completed. Appears decision making will rest with caregiver and mom as the mom is next of kin, however does have some health issues as well. There is a son, however, palliative team has not been able to make contact with son.  Pt has medicaid and should be able to get facility placement under medicaid benefits. It does not appear that patient has other insurance or applied for medical disability.  CSW will work on case and see if SNF's can offer a bed locally. CSW discussed with  admissions staff at [facility] and [facility].   Social Worker assessment / plan:  CSW will assist with disposition.  Employment status:  Disabled (Comment on whether or not currently receiving Disability) Insurance information:  Medicaid In [place] PT Recommendations:  [place] / Referral to community resources:  [place]  Patient/Family's Response to care:  Family appreciative of CSW f/u for SNF placement. No other issues identified at this time.  Patient/Family's Understanding of and Emotional Response to Diagnosis, Current Treatment, and Prognosis:  Family has some understanding of the diagnosis, current treatment and progosis. CSW will assist with disposition to SNF for nursing needs. No issues or concerns identified.  Emotional Assessment Appearance:  Appears stated age Attitude/Demeanor/Rapport:   (Cooperative) Affect (typically observed):   (Confused) Orientation:  Oriented to Self Alcohol / Substance use:  Not Applicable Psych involvement (Current and /or in the community):  No (Comment)  Discharge Needs  Concerns to be addressed:  Care Coordination Readmission within the last 30 days:  No Current discharge risk:  Chronically ill, Physical Impairment Barriers to Discharge:  No Barriers Identified   Normajean Baxter, LCSW 03/09/2017, 11:14 AM

## 2017-03-10 LAB — CBC
HEMATOCRIT: 24.3 % — AB (ref 36.0–46.0)
Hemoglobin: 8.1 g/dL — ABNORMAL LOW (ref 12.0–15.0)
MCH: 25.2 pg — AB (ref 26.0–34.0)
MCHC: 33.3 g/dL (ref 30.0–36.0)
MCV: 75.5 fL — AB (ref 78.0–100.0)
PLATELETS: 164 10*3/uL (ref 150–400)
RBC: 3.22 MIL/uL — ABNORMAL LOW (ref 3.87–5.11)
RDW: 17.9 % — AB (ref 11.5–15.5)
WBC: 4.4 10*3/uL (ref 4.0–10.5)

## 2017-03-10 NOTE — Progress Notes (Signed)
PROGRESS NOTE    [name] [name]  IHK:[id number] DOB: 08-21-1955 DOA: 03/03/2017 PCP: Ladell Pier, MD   Brief Narrative: [name] [name] is a 61 y.o. old female with a history of HIV, dementia, seizure disorder.  Patient presented secondary to increasing abdominal found to have likely metastatic disease of unknown origin.  Medicine is on board for goals of care.  Patient is demented which complicates the situation.    Assessment & Plan:   Principal Problem:   Metastatic cancer ([place]) Active Problems:   Human immunodeficiency virus (HIV) disease ([place])   Seizures ([place])   Dementia   Anemia   Substance abuse ([place])   Palliative care by specialist   Metastatic cancer Unknown primary. Evidence of liver lesions with necrotic tissue.  -palliative care recommendations: have had contact with mother and care giver who are aware of plans -will continue to attempt to contact family  HIV Not currently on antivirals. Last quant was undetectable in 2017. Last CD4 count of 500 in 2018.  Seizure disorder Stable. -continue Keppra  Dementia Patient is oriented to person and place.  Anemia Acute drop. Unknown cause. No evidence of bleed on CT chest/abdomen/pelvis. -repeat CBC   DVT prophylaxis: SCDs Code Status: DNR Family Communication: None. Attempted 11/1. Could not reach by phone Disposition Plan: Discharge to SNF with hospice   Consultants:   Palliative care medicine  Procedures:   None  Antimicrobials:  None    Subjective: Afebrile  Objective: Vitals:   03/09/17 2000 03/10/17 0500 03/10/17 0515 03/10/17 0900  BP: 115/70  112/65 100/61  Pulse: 72  68 94  Resp: 14  14   Temp: 98.5 F (36.9 C)  98.5 F (36.9 C) 98.2 F (36.8 C)  TempSrc: Oral  Oral Oral  SpO2: 100%  100% 100%  Weight:  43.5 kg (96 lb)      Intake/Output Summary (Last 24 hours) at 03/10/17 1309 Last data filed at 03/10/17 0900  Gross per 24 hour  Intake              480 ml    Output                0 ml  Net              480 ml   Filed Weights   03/07/17 0500 03/08/17 0500 03/10/17 0500  Weight: 43.5 kg (95 lb 14.4 oz) 43.8 kg (96 lb 9.6 oz) 43.5 kg (96 lb)     Data Reviewed: I have personally reviewed following labs and imaging studies  CBC:  Recent Labs Lab 03/03/17 1517  WBC 3.9*  NEUTROABS 2.6  HGB 8.1*  HCT 25.0*  MCV 76.7*  PLT 756   Basic Metabolic Panel:  Recent Labs Lab 03/03/17 1517  NA 137  K 3.9  CL 107  CO2 21*  GLUCOSE 98  BUN 12  CREATININE 0.77  CALCIUM 8.9   GFR: Estimated Creatinine Clearance: 51.6 mL/min (by C-G formula based on SCr of 0.77 mg/dL). Liver Function Tests:  Recent Labs Lab 03/03/17 1517  AST 163*  ALT 35  ALKPHOS 487*  BILITOT 1.0  PROT 8.9*  ALBUMIN 3.3*    Recent Labs Lab 03/03/17 1517  LIPASE 44   No results for input(s): AMMONIA in the last 168 hours. Coagulation Profile:  Recent Labs Lab 03/03/17 2030  INR 1.07   Cardiac Enzymes: No results for input(s): CKTOTAL, CKMB, CKMBINDEX, TROPONINI in the last 168 hours. BNP (last 3  results) No results for input(s): PROBNP in the last 8760 hours. HbA1C: No results for input(s): HGBA1C in the last 72 hours. CBG: No results for input(s): GLUCAP in the last 168 hours. Lipid Profile: No results for input(s): CHOL, HDL, LDLCALC, TRIG, CHOLHDL, LDLDIRECT in the last 72 hours. Thyroid Function Tests: No results for input(s): TSH, T4TOTAL, FREET4, T3FREE, THYROIDAB in the last 72 hours. Anemia Panel: No results for input(s): VITAMINB12, FOLATE, FERRITIN, TIBC, IRON, RETICCTPCT in the last 72 hours. Sepsis Labs:  Recent Labs Lab 03/03/17 1533  LATICACIDVEN 1.16    No results found for this or any previous visit (from the past 240 hour(s)).       Radiology Studies: No results found.      Scheduled Meds: . elvitegravir-cobicistat-emtricitabine-tenofovir  1 tablet Oral Daily  . feeding supplement (ENSURE ENLIVE)  237 mL  Oral BID BM  . levETIRAcetam  1,000 mg Oral BID   Continuous Infusions:   LOS: 6 days     Cordelia Poche, MD [facility] 03/10/2017, 1:09 PM Pager: [phone]  If 7PM-7AM, please contact night-coverage [email or web address] Password [email or web address] 03/10/2017, 1:09 PM

## 2017-03-10 NOTE — Social Work (Signed)
CSW received call back from caregiver [name] and mom and they selected [facility] for nursing facility.  CSW will f/u for disposition as patient has a bed. Possible discharge tomorrow.  Elissa Hefty, LCSW Clinical Social Worker [phone]

## 2017-03-10 NOTE — Social Work (Addendum)
PASSR #[id number] A received.  CSW will f/u for disposition to SNF.  10:27am: CSW received call back from [facility] that they can offer a bed. They advised that they work with [facility] for services.   CSW f/u with [facility] for bed offers. Many SNf's have declined patient and she may have limited options for placement.   CSW will f/u with family-caregiver and mom on SNF options.  Elissa Hefty, LCSW Clinical Social Worker [phone]

## 2017-03-10 NOTE — Evaluation (Addendum)
Physical Therapy Evaluation Patient Details Name: [name] [name] MRN: [id number] DOB: 1955/07/04 Today's Date: 03/10/2017   History of Present Illness  61 y.o. female with medical history significant of HIV untreated for over a year, dementia, seizures brought in by a care giver for increasing abdominal girth.  Pt cannot provide any history due to her dementia. Pt is diagnosed with metastatic cancer of unknown origin     Clinical Impression  Diffacult to gather accurate PLOF given patients dementia. Today pt presents AO to person and place, ability to follow single commands, instability with mobility, and generalized weakness globally. Pt ambulates with narrow BOS, small step-to strides with decreased velcoity and path deviation. Pt requires contact guard for safe ambulation due to unsteadiness and weakness. Per RN, pt mother/caregiver also with dementia and given pts presentation today feel SNF is best d/c recommendation from a mobility standpoint. PT will follow to improve activity tolerance and safety with mobility.     Follow Up Recommendations SNF    Equipment Recommendations  Rolling walker with 5" wheels    Recommendations for Other Services       Precautions / Restrictions Precautions Precautions: Fall Restrictions Weight Bearing Restrictions: No      Mobility  Bed Mobility Overal bed mobility: Modified Independent             General bed mobility comments: Mod I with bed mobility, decreased velocity. No UE support needed  Transfers Overall transfer level: Needs assistance Equipment used: None Transfers: Sit to/from Stand Sit to Stand: Min guard         General transfer comment: Pt sit<>stand with min guard due to weakness and imbalance  Ambulation/Gait Ambulation/Gait assistance: Min guard Ambulation Distance (Feet): 30 Feet Assistive device: 1 person hand held assist Gait Pattern/deviations: Step-to pattern Gait velocity: decreased   General Gait  Details: Pt ambulates with narrow BOS, small step-to strides with decreased velcoity and path deviation. Pt requires contact guard for safe ambulation due to unsteadiness and weakness.  Stairs            Wheelchair Mobility    Modified Rankin (Stroke Patients Only)       Balance Overall balance assessment: Needs assistance Sitting-balance support: Feet supported Sitting balance-Leahy Scale: Fair     Standing balance support: No upper extremity supported Standing balance-Leahy Scale: Fair                               Pertinent Vitals/Pain Pain Assessment: No/denies pain    Home Living Family/patient expects to be discharged to:: Private residence Living Arrangements: Parent Available Help at Discharge: Other (Comment) (Mom at home- also with dementia and caregiver per RN) Type of Home: House Home Access: Stairs to enter Entrance Stairs-Rails: None Entrance Stairs-Number of Steps: 1 Home Layout: One level Home Equipment: None Additional Comments: Pt with dementia and unreliable historian.     Prior Function Level of Independence: Independent         Comments: Pt with dementia and unreliable historian      Hand Dominance        Extremity/Trunk Assessment        Lower Extremity Assessment Lower Extremity Assessment: Generalized weakness       Communication   Communication: No difficulties  Cognition Arousal/Alertness: Lethargic Behavior During Therapy: Flat affect Overall Cognitive Status: History of cognitive impairments - at baseline  General Comments: Pt with dementia. AO to person and place. Able to folllow 1 command at a time      General Comments General comments (skin integrity, edema, etc.): Pts SpO2=>95% throughout session.     Exercises General Exercises - Lower Extremity Long Arc Quad: AROM;Both;15 reps   Assessment/Plan    PT Assessment Patient needs continued PT  services  PT Problem List Decreased strength;Decreased activity tolerance;Decreased balance;Decreased coordination;Decreased cognition;Decreased knowledge of use of DME;Decreased safety awareness       PT Treatment Interventions Stair training;Gait training;Functional mobility training;Therapeutic activities;Therapeutic exercise;Balance training    PT Goals (Current goals can be found in the Care Plan section)  Acute Rehab PT Goals Patient Stated Goal: to d/c home PT Goal Formulation: With patient Time For Goal Achievement: 03/17/17 Potential to Achieve Goals: Poor    Frequency Min 2X/week   Barriers to discharge        Co-evaluation               AM-PAC PT "6 Clicks" Daily Activity  Outcome Measure Difficulty turning over in bed (including adjusting bedclothes, sheets and blankets)?: A Little Difficulty moving from lying on back to sitting on the side of the bed? : A Little Difficulty sitting down on and standing up from a chair with arms (e.g., wheelchair, bedside commode, etc,.)?: A Little Help needed moving to and from a bed to chair (including a wheelchair)?: A Little Help needed walking in hospital room?: A Little Help needed climbing 3-5 steps with a railing? : A Lot 6 Click Score: 17    End of Session Equipment Utilized During Treatment: Gait belt Activity Tolerance: Patient limited by lethargy;Patient limited by fatigue Patient left: in chair;with call bell/phone within reach Nurse Communication: Mobility status PT Visit Diagnosis: Unsteadiness on feet (R26.81);Other abnormalities of gait and mobility (R26.89);Muscle weakness (generalized) (M62.81)    Time: 3267-1245 PT Time Calculation (min) (ACUTE ONLY): 23 min   Charges:   PT Evaluation $PT Eval Moderate Complexity: 1 Mod     PT G Codes:        Reinaldo Berber, PT, DPT Acute Rehab Services Pager: [phone]    Reinaldo Berber 03/10/2017, 9:03 AM

## 2017-03-10 NOTE — Social Work (Signed)
CSW discussed SNF options with caregiver, who indicated that she would discuss with mom and get back to CSW.  Pt was offered beds at [facility] [place].  CSW will f/u.  Elissa Hefty, LCSW Clinical Social Worker [phone]

## 2017-03-10 NOTE — Evaluation (Signed)
Occupational Therapy Evaluation Patient Details Name: [name] [name] MRN: [id number] DOB: 1955-07-25 Today's Date: 03/10/2017    History of Present Illness 61 y.o. female with medical history significant of HIV untreated for over a year, dementia, seizures brought in by a care giver for increasing abdominal girth.  Pt cannot provide any history due to her dementia. Pt is diagnosed with metastatic cancer of unknown origin   Clinical Impression   This 61 y/o F presents with the above. Pt poor historian regarding PLOF due to baseline cognitive deficits; per chart review Pt lives with mother who also has dementia. Pt completed room level functional mobility, seated and standing ADLs with close MinGuard-MinA without AD, MinA for LB ADLs, presenting with generalized weakness and unsteadiness during dynamic standing tasks. Pt will benefit from continued acute OT services, and feel Pt would benefit from additional OT services in SNF setting at discharge to maximize Pt's safety and independence with ADLs and mobility.     Follow Up Recommendations  SNF;Supervision/Assistance - 24 hour    Equipment Recommendations  Other (comment) (defer to next venue )           Precautions / Restrictions Precautions Precautions: Fall Restrictions Weight Bearing Restrictions: No      Mobility Bed Mobility Overal bed mobility: Modified Independent             General bed mobility comments: Mod I with bed mobility, increased time.  Transfers Overall transfer level: Needs assistance Equipment used: None Transfers: Sit to/from Stand Sit to Stand: Min guard         General transfer comment: Pt sit<>stand with min guard due to weakness and imbalance, increased time    Balance Overall balance assessment: Needs assistance Sitting-balance support: Feet supported Sitting balance-Leahy Scale: Fair     Standing balance support: No upper extremity supported;During functional activity Standing  balance-Leahy Scale: Fair Standing balance comment: maintains static standing with close minguard, intermittent minA                            ADL either performed or assessed with clinical judgement   ADL Overall ADL's : Needs assistance/impaired Eating/Feeding: Set up;Sitting   Grooming: Min guard;Wash/dry face;Oral care;Standing Grooming Details (indicate cue type and reason): close min guard for safety Upper Body Bathing: Min guard;Sitting   Lower Body Bathing: Minimal assistance;Sit to/from stand   Upper Body Dressing : Minimal assistance;Sitting   Lower Body Dressing: Minimal assistance;Sit to/from stand Lower Body Dressing Details (indicate cue type and reason): Pt doffs/dons new pair of socks sitting up in bed, adjusts socks seated EOB with close minguard  Toilet Transfer: Minimal assistance;Ambulation;Regular Toilet   Toileting- Clothing Manipulation and Hygiene: Minimal assistance;Sit to/from stand       Functional mobility during ADLs: Min guard;Minimal assistance General ADL Comments: Pt completes room level functional mobility with MinGuard and intermittent MinA during functional task completion and mobility      Vision Baseline Vision/History: Wears glasses Wears Glasses: Reading only                  Pertinent Vitals/Pain Pain Assessment: No/denies pain          Extremity/Trunk Assessment Upper Extremity Assessment Upper Extremity Assessment: Generalized weakness   Lower Extremity Assessment Lower Extremity Assessment: Defer to PT evaluation       Communication Communication Communication: No difficulties   Cognition Arousal/Alertness: Lethargic Behavior During Therapy: Flat affect Overall Cognitive Status: History of  cognitive impairments - at baseline                                 General Comments: Pt with dementia. A&O to person, DOB and place ([place]). Able to follow one step commands with  increased time                    Home Living Family/patient expects to be discharged to:: Private residence Living Arrangements: Parent Available Help at Discharge: Other (Comment) (Mom at home- also with dementia and caregiver per RN) Type of Home: House Home Access: Stairs to enter [employer] of Steps: 1 Entrance Stairs-Rails: None Home Layout: One level     Bathroom Shower/Tub: Occupational psychologist: Standard Bathroom Accessibility: No   Home Equipment: None   Additional Comments: Pt with dementia and unreliable historian.       Prior Functioning/Environment Level of Independence: Independent        Comments: Pt with dementia and unreliable historian         OT Problem List: Decreased strength;Impaired balance (sitting and/or standing);Decreased activity tolerance;Decreased cognition      OT Treatment/Interventions: Self-care/ADL training;DME and/or AE instruction;Therapeutic activities;Balance training;Therapeutic exercise;Energy conservation;Patient/family education    OT Goals(Current goals can be found in the care plan section) Acute Rehab OT Goals Patient Stated Goal: to d/c home OT Goal Formulation: With patient Time For Goal Achievement: 03/24/17 Potential to Achieve Goals: Fair  OT Frequency: Min 2X/week    AM-PAC PT "6 Clicks" Daily Activity     Outcome Measure Help from another person eating meals?: None Help from another person taking care of personal grooming?: A Little Help from another person toileting, which includes using toliet, bedpan, or urinal?: A Little Help from another person bathing (including washing, rinsing, drying)?: A Little Help from another person to put on and taking off regular upper body clothing?: A Little Help from another person to put on and taking off regular lower body clothing?: A Little 6 Click Score: 19   End of Session Equipment Utilized During Treatment: Gait belt Nurse  Communication: Mobility status  Activity Tolerance: Patient tolerated treatment well Patient left: in bed;with call bell/phone within reach;with bed alarm set  OT Visit Diagnosis: Unsteadiness on feet (R26.81);Muscle weakness (generalized) (M62.81)                Time: 1505-6979 OT Time Calculation (min): 20 min Charges:  OT General Charges $OT Visit: 1 Visit OT Evaluation $OT Eval Moderate Complexity: 1 Mod G-Codes:     Lou Cal, OT Pager [phone] 03/10/2017   Raymondo Band 03/10/2017, 3:34 PM

## 2017-03-11 MED ORDER — SERTRALINE HCL 50 MG PO TABS
150.0000 mg | ORAL_TABLET | Freq: Every day | ORAL | Status: DC
  Administered 2017-03-11 – 2017-03-14 (×4): 150 mg via ORAL
  Filled 2017-03-11 (×4): qty 1

## 2017-03-11 NOTE — Social Work (Signed)
Clinical Social Worker facilitated patient discharge including contacting patient family and facility to confirm patient discharge plans.  Clinical information faxed to facility and family agreeable with plan.    CSW arranged ambulance transport via [facility] to [facility].   RN to call [phone] to give report prior to discharge. Pt going to Room 208A.  Clinical Social Worker will sign off for now as social work intervention is no longer needed. Please consult [place] again if new need arises.  Elissa Hefty, LCSW Clinical Social Worker [phone]

## 2017-03-11 NOTE — Discharge Summary (Addendum)
Physician Discharge Summary  [name] [name] OHY:[id number] DOB: February 12, 1956 DOA: 03/03/2017  PCP: Ladell Pier, MD  Admit date: 03/03/2017 Discharge date: 03/14/2017  Admitted From: Home Disposition: SNF  Recommendations for Outpatient Follow-up:  1. Follow up with PCP in 1 week 2. Follow up with Infectious disease as an outpatient; Dr. Linus Salmons 3. Recommend Hospice services 4. Please obtain CBC in one week to recheck hemoglobin 5. Please follow up on the following pending results: None  Home Health: SNF Equipment/Devices: SNF  Discharge Condition: Guarded CODE STATUS: DNR Diet recommendation: Regular diet   Brief/Interim Summary:  Admission HPI written by Phillips Grout, MD   Chief Complaint:   Abdomen swelling  HPI: [name] [name] is a 61 y.o. female with medical history significant of HIV untreated for over a year, dementia, seizures brought in by a care giver for increasing abdominal girth.  Pt cannot provide any history due to her dementia.  Care giver has left her in the ED.  Pt denies any pain.  Pt found to be anemic with cancer all over in her liver with a pelvic mass.  Pt is heme neg.  Pt referred for admission for her anemia and new masses.    Hospital course:  Metastatic cancer Unknown primary. Evidence of liver lesions with necrotic tissue. Palliative care consulted and per discussion with mother and care giver, recommending hospice care at a SNF.  HIV Continued Genvoya. Last quant was undetectable in 2017. Last CD4 count of 500 in 2018. Discuss with infectious disease and there is no viable cheaper alternative. Patient has been obtaining her medications as an outpatient. She will need follow-up with infectious disease for a refill per chart review.  Seizure disorder Stable. Continued Keppra  Dementia Patient is oriented to person and place only. Stable. No behavioral issues. Does not appear able to understand her disease process and its  implications.  Anemia Unknown if acute drop. Last hemoglobin in 2017. Unknown cause. Fecal occult blood test was negative. No evidence of bleed on CT chest/abdomen/pelvis. Stable prior to discharge.  Discharge Diagnoses:  Principal Problem:   Metastatic cancer ([place]) Active Problems:   Human immunodeficiency virus (HIV) disease ([place])   Seizures ([place])   Dementia   Anemia   Substance abuse ([place])   Palliative care by specialist    Discharge Instructions   Allergies as of 03/11/2017   No Known Allergies     Medication List    STOP taking these medications   LORazepam 1 MG tablet Commonly known as:  ATIVAN     TAKE these medications   elvitegravir-cobicistat-emtricitabine-tenofovir 150-150-200-10 MG Tabs tablet Commonly known as:  GENVOYA Take 1 tablet by mouth daily. What changed:  when to take this   feeding supplement (ENSURE ENLIVE) Liqd Take 237 mLs by mouth 2 (two) times daily between meals. ICD 10 code: E43, R63.6   ketoconazole 2 % shampoo Commonly known as:  NIZORAL Apply 1 application topically 2 (two) times a week.   latanoprost 0.005 % ophthalmic solution Commonly known as:  XALATAN 1 drop in both eyes daily   levETIRAcetam 1000 MG tablet Commonly known as:  KEPPRA Take 1 tablet (1,000 mg total) by mouth 2 (two) times daily.   multivitamin tablet Take 1 tablet by mouth daily.   sertraline 100 MG tablet Commonly known as:  ZOLOFT Take 1.5 tablets (150 mg total) by mouth daily.      Follow-up [facility] Follow up.  Why:  Call tomorrow to schedule follow up appointment post Emergency Department visit with Dr. Barbera Setters information: [place] [id number] [phone]         No Known Allergies  Consultations:  Palliative care   Procedures/Studies: Dg Chest 2 View  Result Date: 03/03/2017 CLINICAL DATA:  Loose stools. EXAM: CHEST  2 VIEW  COMPARISON:  11/06/2015. FINDINGS: Mediastinum and hilar structures are normal. Mild bibasilar atelectasis/infiltrates No pleural effusion or pneumothorax . Mild gastric distention. Thoracic spine scoliosis. IMPRESSION: 1.  Low lung volumes with mild basilar atelectasis/infiltrates. 2.  Mild gastric distention . Electronically Signed   By: Marcello Moores  Register   On: 03/03/2017 15:51   Ct Chest W Contrast  Result Date: 03/03/2017 CLINICAL DATA:  61 year old female with abdominal bloating and loose stools. EXAM: CT CHEST, ABDOMEN, AND PELVIS WITH CONTRAST TECHNIQUE: Multidetector CT imaging of the chest, abdomen and pelvis was performed following the standard protocol during bolus administration of intravenous contrast. CONTRAST:  167mL ISOVUE-300 IOPAMIDOL (ISOVUE-300) INJECTION 61% COMPARISON:  Chest radiograph dated 03/03/2017, abdominal ultrasound dated 03/03/2017 FINDINGS: Evaluation is limited due to streak artifact caused by patient's arms. CT CHEST FINDINGS Cardiovascular: There is no cardiomegaly or pericardial effusion. Coronary vascular calcification involving the LAD noted. The thoracic aorta appears unremarkable. The origins of the great vessels of the aortic arch appear patent. The main pulmonary trunk and central pulmonary arteries are grossly unremarkable as visualized. Mediastinum/Nodes: No hilar or mediastinal adenopathy. No axillary adenopathy. There is thickened appearance of the esophagus which may be related to underdistention. Esophagitis or underlying esophageal neoplasm is not excluded. There is slight prominence of the distal esophagus above the diaphragm which may represent a small hiatal hernia. This can be further evaluated with endoscopy. Lungs/Pleura: The lungs are clear. There is no pleural effusion or pneumothorax. The central airways are patent. Musculoskeletal: No acute osseous pathology. CT ABDOMEN PELVIS FINDINGS There is no intra-abdominal free air.  No free fluid. Hepatobiliary:  The liver is enlarged. There are innumerable heterogeneous the enhancing lesions within the liver. The largest lesion or conglomerate of lesions involves the left lobe of the liver and measures approximately 8.2 x 7.9 x 11.8 cm. This lesions are consistent with malignancy and most likely represent metastatic disease. Multifocal the parasellar carcinoma or malignancy arising from the biliary tree are not excluded. Further characterization with MRI without and with contrast or CT or ultrasound-guided tissue sampling recommended. There is extension of the tumor into the left hepatic duct (series 3, image 60 and coronal image 33). There is dilatation of the intrahepatic biliary trees as well as common bile duct. The gallbladder is distended. There multiple stones in the neck of the gallbladder. There is no pericholecystic fluid or evidence of acute cholecystitis by CT. Pancreas: Evaluation of the pancreas however is limited due to mass effect and displacement of the pancreatic tissue by the left hepatic mass. There is no dilatation of the pancreatic duct or evidence of gland atrophy. There is somewhat inferior displacement of the body of the pancreas by mass extending from the left lobe of the liver. There is an area of somewhat loss of fat plane between the mass and the pancreas however evaluation is very limited. Spleen: The spleen is enlarged measuring approximately 15 cm in craniocaudal length likely related to increased portal hypertension. Adrenals/Urinary Tract: The adrenal glands, kidneys, and the urinary bladder appear unremarkable. Stomach/Bowel: The stomach is displaced and compressed by the liver mass. The mass in the  left lobe of the liver extends into the gastrohepatic space and abuts the lesser curvature of the stomach. There is no bowel obstruction or active inflammation. The appendix is normal. Vascular/Lymphatic: Mild atherosclerotic calcification of the abdominal aorta. The origins of the celiac axis,  SMA, IMA and the renal arteries are patent. No portal venous gas noted. There is mild compression of the main portal vein at the liver mass. Mildly enlarged splenic vein. A mildly dilated vein extends from the perirectal region to the porta splenic confluence. No definite retroperitoneal adenopathy. Reproductive: The uterus is poorly visualized and not well evaluated. There is an area of ill-defined soft tissue density in the pelvis superior to the uterus anterior to the rectum (series 3, image 88. This area measures approximately 2.3 x 6.7 cm. There is infiltration of the anterior perisigmoid fat plane. This is concerning for infiltrating tumor. Other: None Musculoskeletal: No acute or significant osseous findings. IMPRESSION: 1. Enlarged liver with multiple heterogeneous the enhancing and necrotic masses most consistent with metastatic disease. Further characterization with MRI or direct tissue sampling recommended. 2. Ill-defined soft tissue in the pelvis with infiltration of the fat planes anterior to the sigmoid colon to concerning for infiltrative tumor. 3. Evidence of portal hypertension and splenomegaly. 4. No metastatic disease in the chest. 5. Thickened appearance of the distal esophagus above the diaphragm which may represent underdistention versus a small hiatal hernia. A neoplastic process is not excluded. Electronically Signed   By: Anner Crete M.D.   On: 03/03/2017 23:43   US Abdomen Complete  Result Date: 03/03/2017 CLINICAL DATA:  Abdominal bloating and diarrhea for several days. EXAM: ABDOMEN ULTRASOUND COMPLETE COMPARISON:  None. FINDINGS: Gallbladder: Gallstones are noted the gallbladder. No wall thickening visualized. No sonographic Murphy sign noted by sonographer. Common bile duct: Diameter: Dilated measuring 17 mm. Liver: There are multiple heterogeneous masses throughout the liver, largest measures 7.6 cm. The liver is enlarged measuring 21.9 cm. Intrahepatic biliary ductal  dilatation is identified. IVC: No abnormality visualized. Pancreas: Poor visualization reported by sonographer. Spleen: Size and appearance within normal limits. Right Kidney: Length: 10.3 cm. There is diffuse increased echotexture of the right kidney. No mass or hydronephrosis visualized. Left Kidney: Length: 10.3 cm. Echogenicity within normal limits. No mass or hydronephrosis visualized. Abdominal aorta: No aneurysm visualized. Other findings: None. IMPRESSION: Enlarged liver with innumerable masses. Further evaluation with abdomen pelvic with contrast CT is recommended. Metastatic disease is not excluded. Intra and extrahepatic biliary ductal dilatation. Gallstones within the gallbladder. Electronically Signed   By: Abelardo Diesel M.D.   On: 03/03/2017 21:44   Ct Abdomen Pelvis W Contrast  Result Date: 03/03/2017 CLINICAL DATA:  61 year old female with abdominal bloating and loose stools. EXAM: CT CHEST, ABDOMEN, AND PELVIS WITH CONTRAST TECHNIQUE: Multidetector CT imaging of the chest, abdomen and pelvis was performed following the standard protocol during bolus administration of intravenous contrast. CONTRAST:  19mL ISOVUE-300 IOPAMIDOL (ISOVUE-300) INJECTION 61% COMPARISON:  Chest radiograph dated 03/03/2017, abdominal ultrasound dated 03/03/2017 FINDINGS: Evaluation is limited due to streak artifact caused by patient's arms. CT CHEST FINDINGS Cardiovascular: There is no cardiomegaly or pericardial effusion. Coronary vascular calcification involving the LAD noted. The thoracic aorta appears unremarkable. The origins of the great vessels of the aortic arch appear patent. The main pulmonary trunk and central pulmonary arteries are grossly unremarkable as visualized. Mediastinum/Nodes: No hilar or mediastinal adenopathy. No axillary adenopathy. There is thickened appearance of the esophagus which may be related to underdistention. Esophagitis or underlying esophageal neoplasm  is not excluded. There is  slight prominence of the distal esophagus above the diaphragm which may represent a small hiatal hernia. This can be further evaluated with endoscopy. Lungs/Pleura: The lungs are clear. There is no pleural effusion or pneumothorax. The central airways are patent. Musculoskeletal: No acute osseous pathology. CT ABDOMEN PELVIS FINDINGS There is no intra-abdominal free air.  No free fluid. Hepatobiliary: The liver is enlarged. There are innumerable heterogeneous the enhancing lesions within the liver. The largest lesion or conglomerate of lesions involves the left lobe of the liver and measures approximately 8.2 x 7.9 x 11.8 cm. This lesions are consistent with malignancy and most likely represent metastatic disease. Multifocal the parasellar carcinoma or malignancy arising from the biliary tree are not excluded. Further characterization with MRI without and with contrast or CT or ultrasound-guided tissue sampling recommended. There is extension of the tumor into the left hepatic duct (series 3, image 60 and coronal image 33). There is dilatation of the intrahepatic biliary trees as well as common bile duct. The gallbladder is distended. There multiple stones in the neck of the gallbladder. There is no pericholecystic fluid or evidence of acute cholecystitis by CT. Pancreas: Evaluation of the pancreas however is limited due to mass effect and displacement of the pancreatic tissue by the left hepatic mass. There is no dilatation of the pancreatic duct or evidence of gland atrophy. There is somewhat inferior displacement of the body of the pancreas by mass extending from the left lobe of the liver. There is an area of somewhat loss of fat plane between the mass and the pancreas however evaluation is very limited. Spleen: The spleen is enlarged measuring approximately 15 cm in craniocaudal length likely related to increased portal hypertension. Adrenals/Urinary Tract: The adrenal glands, kidneys, and the urinary bladder  appear unremarkable. Stomach/Bowel: The stomach is displaced and compressed by the liver mass. The mass in the left lobe of the liver extends into the gastrohepatic space and abuts the lesser curvature of the stomach. There is no bowel obstruction or active inflammation. The appendix is normal. Vascular/Lymphatic: Mild atherosclerotic calcification of the abdominal aorta. The origins of the celiac axis, SMA, IMA and the renal arteries are patent. No portal venous gas noted. There is mild compression of the main portal vein at the liver mass. Mildly enlarged splenic vein. A mildly dilated vein extends from the perirectal region to the porta splenic confluence. No definite retroperitoneal adenopathy. Reproductive: The uterus is poorly visualized and not well evaluated. There is an area of ill-defined soft tissue density in the pelvis superior to the uterus anterior to the rectum (series 3, image 88. This area measures approximately 2.3 x 6.7 cm. There is infiltration of the anterior perisigmoid fat plane. This is concerning for infiltrating tumor. Other: None Musculoskeletal: No acute or significant osseous findings. IMPRESSION: 1. Enlarged liver with multiple heterogeneous the enhancing and necrotic masses most consistent with metastatic disease. Further characterization with MRI or direct tissue sampling recommended. 2. Ill-defined soft tissue in the pelvis with infiltration of the fat planes anterior to the sigmoid colon to concerning for infiltrative tumor. 3. Evidence of portal hypertension and splenomegaly. 4. No metastatic disease in the chest. 5. Thickened appearance of the distal esophagus above the diaphragm which may represent underdistention versus a small hiatal hernia. A neoplastic process is not excluded. Electronically Signed   By: Anner Crete M.D.   On: 03/03/2017 23:43      Subjective: No abdominal pain.  Discharge Exam: Vitals:  03/11/17 0500 03/11/17 0900  BP: 112/63 115/66   Pulse: 89 93  Resp: 16 18  Temp: 98.9 F (37.2 C) 98.7 F (37.1 C)  SpO2: 98% 98%   Vitals:   03/10/17 1500 03/10/17 2100 03/11/17 0500 03/11/17 0900  BP: 107/62 108/66 112/63 115/66  Pulse: 93 97 89 93  Resp: 16 16 16 18   Temp: 98.7 F (37.1 C) 98.9 F (37.2 C) 98.9 F (37.2 C) 98.7 F (37.1 C)  TempSrc: Oral Oral Oral Oral  SpO2: 99% 99% 98% 98%  Weight:   44.1 kg (97 lb 3 oz)     General: Pt is alert, awake, not in acute distress Cardiovascular: RRR, S1/S2 +, no rubs, no gallops Respiratory: CTA bilaterally, no wheezing, no rhonchi Abdominal: Soft, NT, distended with firm masses felt in RUQ, bowel sounds + Extremities: no edema, no cyanosis Neuro: alert and oriented to person and place. Not to time or situation.    The results of significant diagnostics from this hospitalization (including imaging, microbiology, ancillary and laboratory) are listed below for reference.     Microbiology: No results found for this or any previous visit (from the past 240 hour(s)).   Labs: BNP (last 3 results) No results for input(s): BNP in the last 8760 hours. Basic Metabolic Panel: No results for input(s): NA, K, CL, CO2, GLUCOSE, BUN, CREATININE, CALCIUM, MG, PHOS in the last 168 hours. Liver Function Tests: No results for input(s): AST, ALT, ALKPHOS, BILITOT, PROT, ALBUMIN in the last 168 hours. No results for input(s): LIPASE, AMYLASE in the last 168 hours. No results for input(s): AMMONIA in the last 168 hours. CBC:  Recent Labs Lab 03/10/17 1426  WBC 4.4  HGB 8.1*  HCT 24.3*  MCV 75.5*  PLT 164   Cardiac Enzymes: No results for input(s): CKTOTAL, CKMB, CKMBINDEX, TROPONINI in the last 168 hours. BNP: Invalid input(s): POCBNP CBG: No results for input(s): GLUCAP in the last 168 hours. D-Dimer No results for input(s): DDIMER in the last 72 hours. Hgb A1c No results for input(s): HGBA1C in the last 72 hours. Lipid Profile No results for input(s): CHOL, HDL,  LDLCALC, TRIG, CHOLHDL, LDLDIRECT in the last 72 hours. Thyroid function studies No results for input(s): TSH, T4TOTAL, T3FREE, THYROIDAB in the last 72 hours.  Invalid input(s): FREET3 Anemia work up No results for input(s): VITAMINB12, FOLATE, FERRITIN, TIBC, IRON, RETICCTPCT in the last 72 hours. Urinalysis    Component Value Date/Time   COLORURINE YELLOW 11/06/2015 1304   APPEARANCEUR CLEAR 11/06/2015 1304   LABSPEC 1.020 11/06/2015 1304   PHURINE 7.5 11/06/2015 1304   GLUCOSEU NEGATIVE 11/06/2015 1304   GLUCOSEU NEG mg/dL 04/08/2010 2053   HGBUR NEGATIVE 11/06/2015 1304   BILIRUBINUR NEGATIVE 11/06/2015 1304   BILIRUBINUR neg 12/20/2014 1134   KETONESUR NEGATIVE 11/06/2015 1304   PROTEINUR NEGATIVE 11/06/2015 1304   UROBILINOGEN 0.2 02/16/2015 1446   NITRITE NEGATIVE 11/06/2015 1304   LEUKOCYTESUR NEGATIVE 11/06/2015 1304     Time coordinating discharge: Over 30 minutes  SIGNED:   Cordelia Poche, MD [facility] 03/11/2017, 11:19 AM Pager [phone]  If 7PM-7AM, please contact night-coverage [email or web address] Password [email or web address]

## 2017-03-11 NOTE — Clinical Social Work Placement (Signed)
   CLINICAL SOCIAL WORK PLACEMENT  NOTE  Date:  03/11/2017  Patient Details  Name: [name] [name] MRN: [id number] Date of Birth: [birth year]  Clinical Social Work is seeking post-discharge placement for this patient at the [place] level of care (*CSW will initial, date and re-position this form in  chart as items are completed):  Yes   Patient/family provided with [facility]'s list of facilities offering this level of care within the geographic area requested by the patient (or if unable, by the patient's family).  Yes   Patient/family informed of their freedom to choose among providers that offer the needed level of care, that participate in Medicare, Medicaid or managed care program needed by the patient, have an available bed and are willing to accept the patient.  Yes   Patient/family informed of [facility]'s ownership interest in [facility] and [facility], as well as of the fact that they are under no obligation to receive care at these facilities.  PASRR submitted to EDS on       PASRR number received on 03/09/17     Existing PASRR number confirmed on       FL2 transmitted to all facilities in geographic area requested by pt/family on 03/09/17     FL2 transmitted to all facilities within larger geographic area on       Patient informed that his/her managed care company has contracts with or will negotiate with certain facilities, including the following:        Yes   Patient/family informed of bed offers received.  Patient chooses bed at  [facility])     Physician recommends and patient chooses bed at      Patient to be transferred to  [facility]) on 03/11/17.  Patient to be transferred to facility by [facility]     Patient family notified on 03/11/17 of transfer.  Name of family member notified:  caregiver and mom     PHYSICIAN       Additional Comment:     _______________________________________________ Normajean Baxter, LCSW 03/11/2017, 11:33 AM

## 2017-03-11 NOTE — Social Work (Signed)
CSW received follow-up from SNF that they will not accept patient, despite CSW explaining that the medication-Genvoya is at local pharmacy and patient already connected to infectious disease clinic. CSW advised that pt has medicaid.   SNF rescinded bed offer as a result of same. CSW will have to find another SNF bed.  CSW will f/u with doctor and advise of same.  Elissa Hefty, LCSW Clinical Social Worker [phone]

## 2017-03-11 NOTE — Progress Notes (Addendum)
Social worker states SNF needs a cheaper alternative to pt's medication called Genvoya. RN notified MD. MD states he will notify Infectious Disease. Social worker canceled [facility] for pt at this time.

## 2017-03-11 NOTE — Social Work (Signed)
CSW received f/u from SNF that Genvoya medication is too expensive and the SNF cannot afford. SNF advised they cannot take patient until medication is clarified.   CSW cancelled transport to SNF.  CSW contacted family-caregiver and advised of same.  CSW then discussed with RN on floor and she will f/u with Dr. Lonny Prude on same. CSW recommended that maybe infectious disease clinic can assist with enrolling patient in medicare assistance program or provide some assistance for patient with medications. Pt has medicaid and per SNF, it does not cover medication.  CSW will continue to follow for transition to SNF.  Elissa Hefty, LCSW Clinical Social Worker [phone]

## 2017-03-11 NOTE — Social Work (Addendum)
CSW received a bed offer from [facility]. SNf advised that they will not be able to accept patient until Monday.  Pt has no other bed offers and will have to remain in hospital until Monday.  CSW advised [name], care taker and she will discuss with mom.  CSW will f/u at that time for discharge.  Elissa Hefty, LCSW Clinical Social Worker [phone]

## 2017-03-12 NOTE — Progress Notes (Signed)
PROGRESS NOTE    [name]  TKP:[id number] DOB: February 24, 1956 DOA: 03/03/2017 PCP: Ladell Pier, MD   Brief Narrative: [name] [name] is a 61 y.o. old female with a history of HIV, dementia, seizure disorder.  Patient presented secondary to increasing abdominal found to have likely metastatic disease of unknown origin.  Medicine is on board for goals of care.  Patient is demented which complicates the situation.    Assessment & Plan:   Principal Problem:   Metastatic cancer ([place]) Active Problems:   Human immunodeficiency virus (HIV) disease ([place])   Seizures ([place])   Dementia   Anemia   Substance abuse ([place])   Palliative care by specialist   Metastatic cancer Unknown primary. Evidence of liver lesions with necrotic tissue.  -palliative care recommendations: have had contact with mother and care giver who are aware of plans  HIV Not currently on antivirals. Last quant was undetectable in 2017. Last CD4 count of 500 in 2018.  Seizure disorder Stable. -continue Keppra  Dementia Patient is oriented to person and place.  Anemia Acute drop. Unknown cause. No evidence of bleed on CT chest/abdomen/pelvis.   DVT prophylaxis: SCDs Code Status: DNR Family Communication: None. Disposition Plan: Discharge to SNF with hospice   Consultants:   Palliative care medicine  Procedures:   None  Antimicrobials:  None    Subjective: Afebrile  Objective: Vitals:   03/11/17 1300 03/11/17 2004 03/12/17 0300 03/12/17 1000  BP: 118/68 124/75 (!) 101/57   Pulse: (!) 101 (!) 107 88   Resp: 18 18 18    Temp: 98.5 F (36.9 C) 99.6 F (37.6 C) 98.6 F (37 C)   TempSrc: Oral Oral Oral   SpO2: 100% 100% 100%   Weight:      Height:    5\' 1"  (1.549 m)    Intake/Output Summary (Last 24 hours) at 03/12/17 1201 Last data filed at 03/12/17 0900  Gross per 24 hour  Intake              730 ml  Output                0 ml  Net              730 ml   Filed Weights   03/08/17 0500 03/10/17 0500 03/11/17 0500  Weight: 43.8 kg (96 lb 9.6 oz) 43.5 kg (96 lb) 44.1 kg (97 lb 3 oz)   Examination:  General exam: Appears calm and comfortable  Respiratory system: Respiratory effort normal. Gastrointestinal system: Abdomen is distended Central nervous system: Alert and oriented to person and place. Psychiatry: Judgement and insight appear impaired. Mood & affect depressed and flat  Data Reviewed: I have personally reviewed following labs and imaging studies  CBC:  Recent Labs Lab 03/10/17 1426  WBC 4.4  HGB 8.1*  HCT 24.3*  MCV 75.5*  PLT 517   Basic Metabolic Panel: No results for input(s): NA, K, CL, CO2, GLUCOSE, BUN, CREATININE, CALCIUM, MG, PHOS in the last 168 hours. GFR: Estimated Creatinine Clearance: 52.7 mL/min (by C-G formula based on SCr of 0.77 mg/dL). Liver Function Tests: No results for input(s): AST, ALT, ALKPHOS, BILITOT, PROT, ALBUMIN in the last 168 hours. No results for input(s): LIPASE, AMYLASE in the last 168 hours. No results for input(s): AMMONIA in the last 168 hours. Coagulation Profile: No results for input(s): INR, PROTIME in the last 168 hours. Cardiac Enzymes: No results for input(s): CKTOTAL, CKMB, CKMBINDEX, TROPONINI in the last 168  hours. BNP (last 3 results) No results for input(s): PROBNP in the last 8760 hours. HbA1C: No results for input(s): HGBA1C in the last 72 hours. CBG: No results for input(s): GLUCAP in the last 168 hours. Lipid Profile: No results for input(s): CHOL, HDL, LDLCALC, TRIG, CHOLHDL, LDLDIRECT in the last 72 hours. Thyroid Function Tests: No results for input(s): TSH, T4TOTAL, FREET4, T3FREE, THYROIDAB in the last 72 hours. Anemia Panel: No results for input(s): VITAMINB12, FOLATE, FERRITIN, TIBC, IRON, RETICCTPCT in the last 72 hours. Sepsis Labs: No results for input(s): PROCALCITON, LATICACIDVEN in the last 168 hours.  No results found for this or any previous visit (from the  past 240 hour(s)).       Radiology Studies: No results found.      Scheduled Meds: . elvitegravir-cobicistat-emtricitabine-tenofovir  1 tablet Oral Daily  . feeding supplement (ENSURE ENLIVE)  237 mL Oral BID BM  . levETIRAcetam  1,000 mg Oral BID  . sertraline  150 mg Oral Daily   Continuous Infusions:   LOS: 8 days     Cordelia Poche, MD [facility] 03/12/2017, 12:01 PM Pager: [phone]  If 7PM-7AM, please contact night-coverage [email or web address] Password [email or web address] 03/12/2017, 12:01 PM

## 2017-03-13 NOTE — Progress Notes (Addendum)
5784 Report taken, pt resting in bed, alert with confusion, reoriented to time and situation. Pt assessed. Pt denies pain and SOB. NAD, will continue to monitor.   1230 Pt refusing lunch tray, states she's not hungry, will attempt later to get pt to eat.   1505 Pt sleeping in chair, [place] Pt awake sitting in chair, Ensure given to pt and assisted pt with taking sips.   1700 Pt sitting up in chair eating dinner, finished the Ensure. WCTM.   1800 Pt resting comfortably, NAD, awaiting oncoming RN for shift report.

## 2017-03-13 NOTE — Progress Notes (Signed)
PROGRESS NOTE    [name] [name]  DDU:[id number] DOB: 25-Oct-1955 DOA: 03/03/2017 PCP: Ladell Pier, MD   Brief Narrative: [name] [name] is a 61 y.o. old female with a history of HIV, dementia, seizure disorder.  Patient presented secondary to increasing abdominal found to have likely metastatic disease of unknown origin.  Medicine is on board for goals of care.  Patient is demented which complicates the situation.    Assessment & Plan:   Principal Problem:   Metastatic cancer ([place]) Active Problems:   Human immunodeficiency virus (HIV) disease ([place])   Seizures ([place])   Dementia   Anemia   Substance abuse ([place])   Palliative care by specialist   Metastatic cancer Unknown primary. Evidence of liver lesions with necrotic tissue.  -palliative care recommendations: SNF with hospice  HIV Not currently on antivirals. Last quant was undetectable in 2017. Last CD4 count of 500 in 2018.  Seizure disorder Stable. -continue Keppra  Dementia Patient is oriented to person and place.  Anemia Acute drop. Unknown cause. No evidence of bleed on CT chest/abdomen/pelvis.   DVT prophylaxis: SCDs Code Status: DNR Family Communication: None. Disposition Plan: Discharge to SNF with hospice   Consultants:   Palliative care medicine  Procedures:   None  Antimicrobials:  None    Subjective: No concerns. No abdominal pain.  Objective: Vitals:   03/12/17 1000 03/12/17 1300 03/12/17 1900 03/13/17 0612  BP:  118/73 119/61 106/64  Pulse:  83 (!) 111 87  Resp:  18 18   Temp:  98.6 F (37 C) 99.5 F (37.5 C) 97.8 F (36.6 C)  TempSrc:  Oral Oral Oral  SpO2:  98% 100% 98%  Weight:      Height: 5\' 1"  (1.549 m)       Intake/Output Summary (Last 24 hours) at 03/13/2017 1147 Last data filed at 03/12/2017 1900 Gross per 24 hour  Intake 250 ml  Output -  Net 250 ml   Filed Weights   03/08/17 0500 03/10/17 0500 03/11/17 0500  Weight: 43.8 kg (96 lb 9.6 oz) 43.5 kg  (96 lb) 44.1 kg (97 lb 3 oz)   Examination:  General exam: Appears calm and comfortable  Respiratory system: Respiratory effort normal Gastrointestinal system: Abdomen is distended Central nervous system: Alert and oriented to person and place Psychiatry: Judgement and insight appear impaired. Mood & affect depressed and flat  Data Reviewed: I have personally reviewed following labs and imaging studies  CBC: Recent Labs  Lab 03/10/17 1426  WBC 4.4  HGB 8.1*  HCT 24.3*  MCV 75.5*  PLT 237   Basic Metabolic Panel: No results for input(s): NA, K, CL, CO2, GLUCOSE, BUN, CREATININE, CALCIUM, MG, PHOS in the last 168 hours. GFR: Estimated Creatinine Clearance: 52.7 mL/min (by C-G formula based on SCr of 0.77 mg/dL). Liver Function Tests: No results for input(s): AST, ALT, ALKPHOS, BILITOT, PROT, ALBUMIN in the last 168 hours. No results for input(s): LIPASE, AMYLASE in the last 168 hours. No results for input(s): AMMONIA in the last 168 hours. Coagulation Profile: No results for input(s): INR, PROTIME in the last 168 hours. Cardiac Enzymes: No results for input(s): CKTOTAL, CKMB, CKMBINDEX, TROPONINI in the last 168 hours. BNP (last 3 results) No results for input(s): PROBNP in the last 8760 hours. HbA1C: No results for input(s): HGBA1C in the last 72 hours. CBG: No results for input(s): GLUCAP in the last 168 hours. Lipid Profile: No results for input(s): CHOL, HDL, LDLCALC, TRIG, CHOLHDL, LDLDIRECT in  the last 72 hours. Thyroid Function Tests: No results for input(s): TSH, T4TOTAL, FREET4, T3FREE, THYROIDAB in the last 72 hours. Anemia Panel: No results for input(s): VITAMINB12, FOLATE, FERRITIN, TIBC, IRON, RETICCTPCT in the last 72 hours. Sepsis Labs: No results for input(s): PROCALCITON, LATICACIDVEN in the last 168 hours.  No results found for this or any previous visit (from the past 240 hour(s)).       Radiology Studies: No results  found.      Scheduled Meds: . elvitegravir-cobicistat-emtricitabine-tenofovir  1 tablet Oral Daily  . feeding supplement (ENSURE ENLIVE)  237 mL Oral BID BM  . levETIRAcetam  1,000 mg Oral BID  . sertraline  150 mg Oral Daily   Continuous Infusions:   LOS: 9 days     Cordelia Poche, MD [facility] 03/13/2017, 11:47 AM Pager: [phone]  If 7PM-7AM, please contact night-coverage [email or web address] Password [email or web address] 03/13/2017, 11:47 AM

## 2017-03-13 NOTE — Progress Notes (Signed)
Occupational Therapy Treatment Patient Details Name: [name] [name] MRN: [id number] DOB: 29-May-1955 Today's Date: 03/13/2017    History of present illness 61 y.o. female with medical history significant of HIV untreated for over a year, dementia, seizures brought in by a care giver for increasing abdominal girth.  Pt cannot provide any history due to her dementia. Pt is diagnosed with metastatic cancer of unknown origin   OT comments  Pt progressing towards established OT goals. Pt performing toilet transfer with [place] for safety and stability. Pt requiring Min A for toilet hygiene to manage clothing and clean fully. Pt requiring Max cues to complete hand hygiene. Continue to recommend dc to SNF for further OT and will continue to follow acutely to facilitate safe dc.   Follow Up Recommendations  SNF;Supervision/Assistance - 24 hour    Equipment Recommendations  Other (comment)(defer to next venue )    Recommendations for Other Services      Precautions / Restrictions Precautions Precautions: Fall Restrictions Weight Bearing Restrictions: No       Mobility Bed Mobility Overal bed mobility: Modified Independent             General bed mobility comments: Pt exiting bed upon arrival and able to compelte bed mobility without physical A  Transfers Overall transfer level: Needs assistance Equipment used: None Transfers: Sit to/from Stand Sit to Stand: Min guard         General transfer comment: Pt sit<>stand with min guard due to weakness and imbalance, increased time    Balance Overall balance assessment: Needs assistance Sitting-balance support: Feet supported Sitting balance-Leahy Scale: Fair     Standing balance support: No upper extremity supported;During functional activity Standing balance-Leahy Scale: Fair Standing balance comment: maintains static standing with close minguard, intermittent minA                            ADL either  performed or assessed with clinical judgement   ADL Overall ADL's : Needs assistance/impaired     Grooming: Min guard;Standing;Wash/dry hands;Cueing for sequencing Grooming Details (indicate cue type and reason): Close Min Guard for safety instanding while washing hands. Pt requiring Max verbal, visual, and tactile cues to fully wash hands after toilet hygiene. Pt requring Max cues to clean soilt from hands.                 Toilet Transfer: Ambulation;Regular Toilet;Min guard;Grab TEFL teacher Details (indicate cue type and reason): Min Guard for safety Toileting- Clothing Manipulation and Hygiene: Sit to/from stand;Cueing for sequencing;Cueing for safety;Minimal assistance Toileting - Clothing Manipulation Details (indicate cue type and reason): Min Guard gaurd. Min A for managing gown and fully cleaning after BM     Functional mobility during ADLs: Minimal assistance General ADL Comments: Pt demonstrating decreased funcitonal performance and required Min Guard-Min A and Maxs cues to fully complete tasks.     Vision       Perception     Praxis      Cognition Arousal/Alertness: Awake/alert Behavior During Therapy: Flat affect Overall Cognitive Status: History of cognitive impairments - at baseline                                 General Comments: Baseline dementia. Pt requiring Max cues for grooming tasks.         Exercises     Shoulder Instructions  General Comments Educated pt on safety and requiring assistance for OOB activity    Pertinent Vitals/ Pain       Pain Assessment: Faces Faces Pain Scale: No hurt Pain Intervention(s): Monitored during session  Home Living                                          Prior Functioning/Environment              Frequency  Min 2X/week        Progress Toward Goals  OT Goals(current goals can now be found in the care plan section)  Progress towards OT goals:  Progressing toward goals  Acute Rehab OT Goals Patient Stated Goal: to d/c home OT Goal Formulation: With patient Time For Goal Achievement: 03/24/17 Potential to Achieve Goals: Fair ADL Goals Pt Will Perform Grooming: standing;with set-up Pt Will Perform Upper Body Bathing: with set-up;sitting Pt Will Perform Lower Body Bathing: with supervision;sit to/from stand Pt Will Perform Lower Body Dressing: with min guard assist;sit to/from stand Pt Will Transfer to Toilet: ambulating;regular height toilet;with supervision Pt Will Perform Toileting - Clothing Manipulation and hygiene: with supervision  Plan Discharge plan remains appropriate    Co-evaluation                 AM-PAC PT "6 Clicks" Daily Activity     Outcome Measure   Help from another person eating meals?: None Help from another person taking care of personal grooming?: A Little Help from another person toileting, which includes using toliet, bedpan, or urinal?: A Little Help from another person bathing (including washing, rinsing, drying)?: A Little Help from another person to put on and taking off regular upper body clothing?: A Little Help from another person to put on and taking off regular lower body clothing?: A Little 6 Click Score: 19    End of Session    OT Visit Diagnosis: Unsteadiness on feet (R26.81);Muscle weakness (generalized) (M62.81)   Activity Tolerance Patient tolerated treatment well   Patient Left with call bell/phone within reach;in chair;with chair alarm set   Nurse Communication Mobility status;Other (comment)(Pt up in chair)        Time: 9622-2979 OT Time Calculation (min): 10 min  Charges: OT General Charges $OT Visit: 1 Visit OT Treatments $Self Care/Home Management : 8-22 mins  [place], OTR/L Acute Rehab Pager: [phone] Office: [place] 03/13/2017, 12:47 PM

## 2017-03-14 ENCOUNTER — Other Ambulatory Visit: Payer: Self-pay

## 2017-03-14 NOTE — Social Work (Signed)
CSW confirmed bed offer from [place] at [facility].  CSW called caregiver [name] who will contact SNF admissions to discuss paperwork needed.  CSW will f/u for transition to SNF.  Elissa Hefty, LCSW Clinical Social Worker [phone]

## 2017-03-14 NOTE — Clinical Social Work Placement (Signed)
   CLINICAL SOCIAL WORK PLACEMENT  NOTE  Date:  03/14/2017  Patient Details  Name: [name] [name] MRN: [id number] Date of Birth: May 25, 1955  Clinical Social Work is seeking post-discharge placement for this patient at the [place] level of care (*CSW will initial, date and re-position this form in  chart as items are completed):  Yes   Patient/family provided with [place] Work Department's list of facilities offering this level of care within the geographic area requested by the patient (or if unable, by the patient's family).  Yes   Patient/family informed of their freedom to choose among providers that offer the needed level of care, that participate in Medicare, Medicaid or managed care program needed by the patient, have an available bed and are willing to accept the patient.  Yes   Patient/family informed of [place]'s ownership interest in [facility] and [facility], as well as of the fact that they are under no obligation to receive care at these facilities.  PASRR submitted to EDS on       PASRR number received on 03/09/17     Existing PASRR number confirmed on       FL2 transmitted to all facilities in geographic area requested by pt/family on 03/09/17     FL2 transmitted to all facilities within larger geographic area on       Patient informed that his/her managed care company has contracts with or will negotiate with certain facilities, including the following:        Yes   Patient/family informed of bed offers received.  Patient chooses bed at [facility])     Physician recommends and patient chooses bed at      Patient to be transferred to [facility]) on 03/11/17.  Patient to be transferred to facility by [facility]     Patient family notified on 03/14/17 of transfer.  Name of family member notified:  caregiver and mom     PHYSICIAN       Additional Comment:     _______________________________________________ Normajean Baxter, LCSW 03/14/2017, 3:11 PM

## 2017-03-14 NOTE — Social Work (Signed)
Clinical Social Worker facilitated patient discharge including contacting patient family and facility to confirm patient discharge plans.  Clinical information faxed to facility and family agreeable with plan.    CSW arranged ambulance transport via [facility] to [place] .    RN to call [phone] to give report prior to discharge.  Clinical Social Worker will sign off for now as social work intervention is no longer needed. Please consult us again if new need arises.  Giannamarie Paulus, LCSW Clinical Social Worker [phone]    

## 2017-03-14 NOTE — Progress Notes (Signed)
Patient is still stable for discharge to SNF. Please see discharge summary dated 11/2. It will be updated to reflect actual discharge date.  Cordelia Poche, MD [facility] 03/14/2017, 6:42 AM Pager: [phone]

## 2017-03-14 NOTE — Social Work (Signed)
CSW called [facility] to f/u on transition to SNF. CSW left message for Freda Munro in admissions.  CSW will f/u.  Elissa Hefty, LCSW Clinical Social Worker [phone]

## 2017-03-14 NOTE — Progress Notes (Signed)
RN called report to SNF.  

## 2017-03-14 NOTE — Progress Notes (Signed)
Physical Therapy Treatment Patient Details Name: [name] [name] MRN: [id number] DOB: March 01, 1956 Today's Date: 03/14/2017    History of Present Illness 61 y.o. female with medical history significant of HIV untreated for over a year, dementia, seizures brought in by a care giver for increasing abdominal girth.  Pt cannot provide any history due to her dementia. Pt is diagnosed with metastatic cancer of unknown origin    PT Comments    Patient continues to require assistance for safe ambulation and max vc for navigating environment. Current plan remains appropriate.    Follow Up Recommendations  SNF     Equipment Recommendations  Rolling walker with 5" wheels    Recommendations for Other Services       Precautions / Restrictions Precautions Precautions: Fall Restrictions Weight Bearing Restrictions: No    Mobility  Bed Mobility Overal bed mobility: Independent                Transfers Overall transfer level: Needs assistance Equipment used: None Transfers: Sit to/from Stand Sit to Stand: Min guard         General transfer comment: min guard for safety; pt able to stand without assistance   Ambulation/Gait Ambulation/Gait assistance: Min assist;Min guard Ambulation Distance (Feet): 120 Feet Assistive device: None Gait Pattern/deviations: Step-through pattern;Decreased step length - right;Decreased step length - left;Decreased dorsiflexion - right;Decreased dorsiflexion - left;Drifts right/left Gait velocity: decreased   General Gait Details: no AD or HHA however gait belt utilized to assist with balance at times especially in distracting environment; pt required max cues for navigating environment as pt will run into objects and unable to navigate in/out of room without vc   Stairs            Wheelchair Mobility    Modified Rankin (Stroke Patients Only)       Balance Overall balance assessment: Needs assistance Sitting-balance support: Feet  supported Sitting balance-Leahy Scale: Fair       Standing balance-Leahy Scale: Fair Standing balance comment: pt able to static stand without UE support                            Cognition Arousal/Alertness: Awake/alert Behavior During Therapy: Flat affect;Agitated(pt agitated beginning of session ) Overall Cognitive Status: History of cognitive impairments - at baseline                                 General Comments: baseline dementia; pt communicated very little with therapist      Exercises      General Comments        Pertinent Vitals/Pain Pain Assessment: No/denies pain    Home Living                      Prior Function            PT Goals (current goals can now be found in the care plan section) Acute Rehab PT Goals PT Goal Formulation: With patient Time For Goal Achievement: 03/17/17 Potential to Achieve Goals: Poor Progress towards PT goals: Progressing toward goals    Frequency    Min 2X/week      PT Plan Current plan remains appropriate    Co-evaluation              AM-PAC PT "6 Clicks" Daily Activity  Outcome Measure  Difficulty turning over in bed (  including adjusting bedclothes, sheets and blankets)?: A Little Difficulty moving from lying on back to sitting on the side of the bed? : A Little Difficulty sitting down on and standing up from a chair with arms (e.g., wheelchair, bedside commode, etc,.)?: A Little Help needed moving to and from a bed to chair (including a wheelchair)?: A Little Help needed walking in hospital room?: A Little Help needed climbing 3-5 steps with a railing? : A Lot 6 Click Score: 17    End of Session Equipment Utilized During Treatment: Gait belt Activity Tolerance: Patient tolerated treatment well Patient left: in chair;with call bell/phone within reach;with chair alarm set Nurse Communication: Mobility status PT Visit Diagnosis: Unsteadiness on feet (R26.81);Other  abnormalities of gait and mobility (R26.89);Muscle weakness (generalized) (M62.81)     Time: 2035-5974 PT Time Calculation (min) (ACUTE ONLY): 22 min  Charges:  $Gait Training: 8-22 mins                    G Codes:       Earney Navy, PTA Pager: [phone]     Darliss Cheney 03/14/2017, 10:29 AM

## 2017-03-25 ENCOUNTER — Ambulatory Visit: Payer: Self-pay | Admitting: Internal Medicine

## 2017-05-10 DEATH — deceased

## 2017-10-17 ENCOUNTER — Telehealth: Payer: Self-pay | Admitting: *Deleted

## 2017-10-17 NOTE — Telephone Encounter (Signed)
Called to reschedule patient's follow up. Person who answered phone stated she passed away last year. This RN offered her sympathies.

## 2017-10-19 ENCOUNTER — Ambulatory Visit: Payer: Medicaid Other | Admitting: Diagnostic Neuroimaging

## 2018-10-06 IMAGING — DX DG CHEST 2V
2 series · 2 of 2 positions shown · non-contrast
Comparison: 11/06/2015.

CLINICAL DATA: Loose stools.

EXAM:
CHEST  2 VIEW

[chest lat]
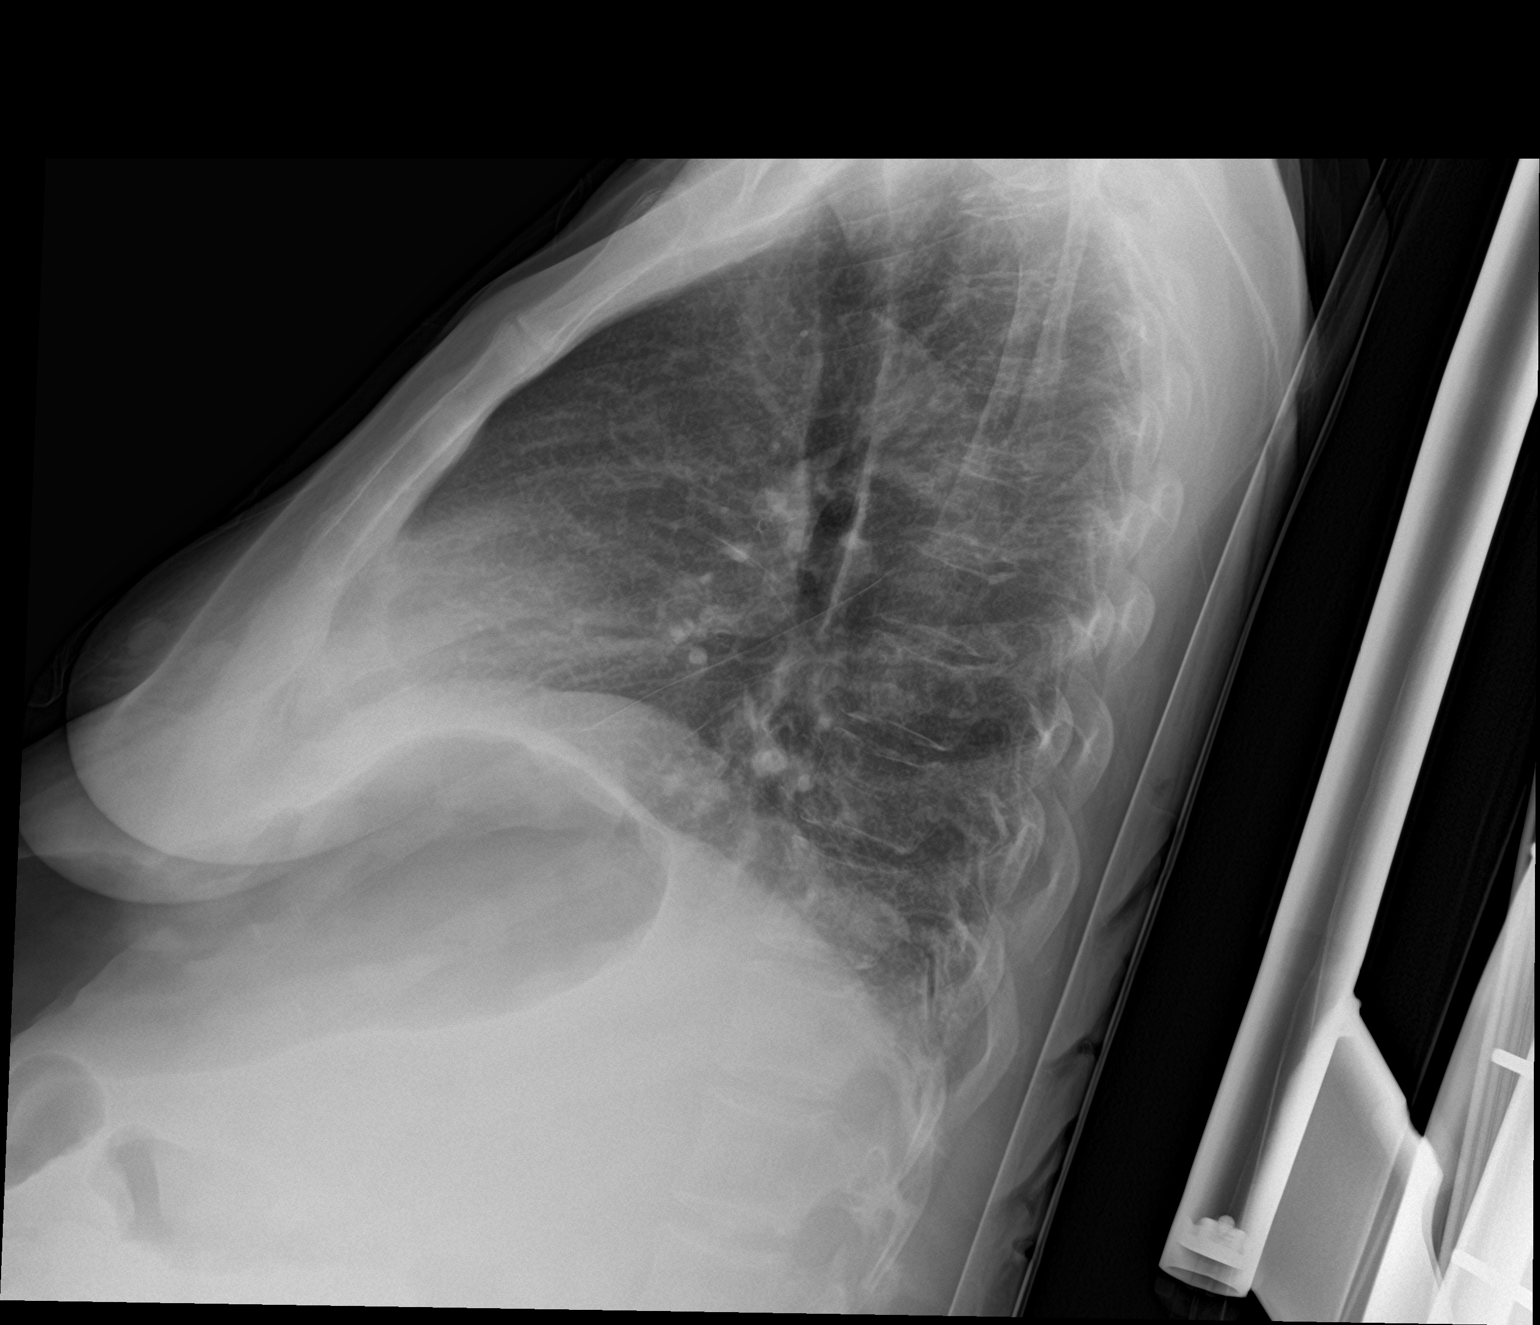

[chest ap]
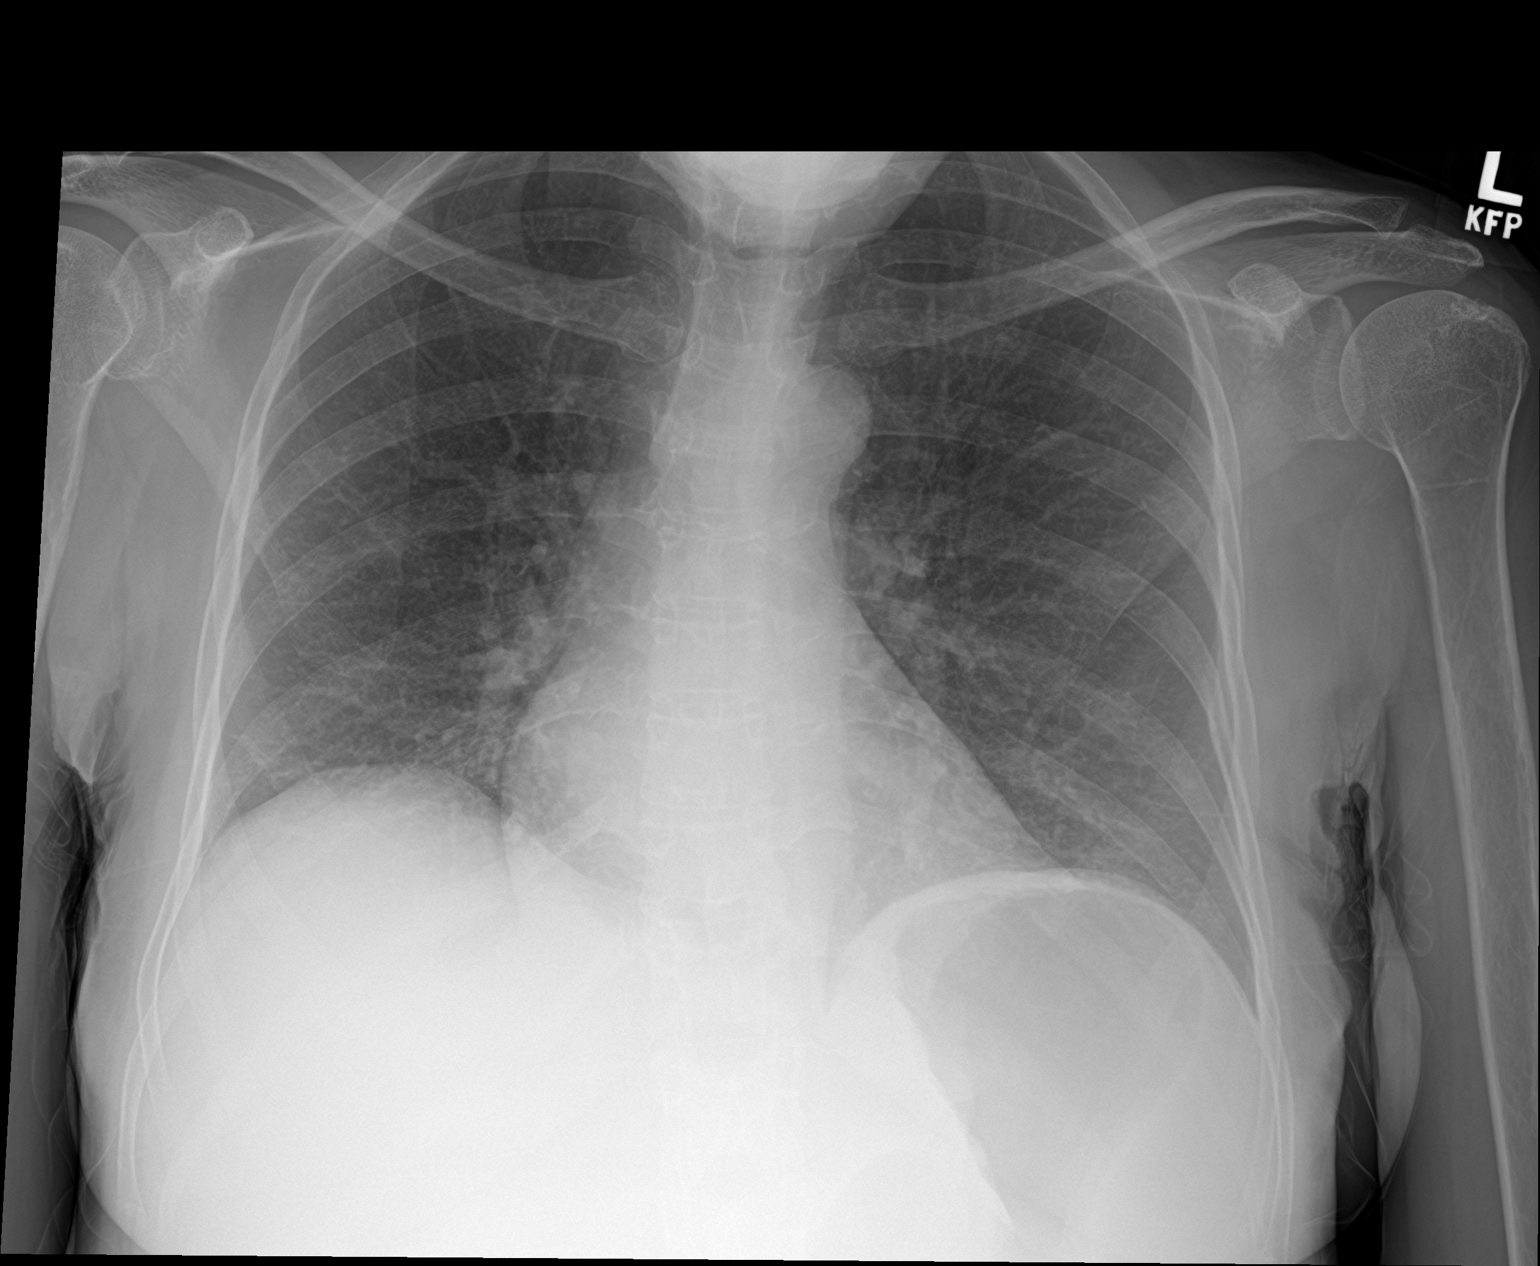

[2 of 2 positions shown; findings below may reference images not displayed]

FINDINGS: Mediastinum and hilar structures are normal. Mild bibasilar
atelectasis/infiltrates No pleural effusion or pneumothorax . Mild
gastric distention. Thoracic spine scoliosis.
IMPRESSION: 1.  Low lung volumes with mild basilar atelectasis/infiltrates.

2.  Mild gastric distention .
